# Patient Record
Sex: Female | Born: 1955
Health system: Southern US, Community
[De-identification: ages and names within clinical notes are randomized; demographics above are authoritative.]

## PROBLEM LIST (undated history)

## (undated) DIAGNOSIS — E119 Type 2 diabetes mellitus without complications: Secondary | ICD-10-CM

## (undated) DIAGNOSIS — E785 Hyperlipidemia, unspecified: Secondary | ICD-10-CM

## (undated) DIAGNOSIS — I10 Essential (primary) hypertension: Secondary | ICD-10-CM

## (undated) DIAGNOSIS — I639 Cerebral infarction, unspecified: Secondary | ICD-10-CM

## (undated) HISTORY — DX: Cerebral infarction, unspecified: I63.9

## (undated) HISTORY — DX: Type 2 diabetes mellitus without complications: E11.9

## (undated) HISTORY — DX: Essential (primary) hypertension: I10

## (undated) HISTORY — DX: Hyperlipidemia, unspecified: E78.5

---

## 1999-05-03 ENCOUNTER — Encounter: Admission: RE | Admit: 1999-05-03 | Discharge: 1999-05-03 | Payer: Self-pay | Admitting: Family Medicine

## 1999-05-03 ENCOUNTER — Encounter: Payer: Self-pay | Admitting: Family Medicine

## 2000-05-03 ENCOUNTER — Encounter: Admission: RE | Admit: 2000-05-03 | Discharge: 2000-05-03 | Payer: Self-pay | Admitting: Family Medicine

## 2000-05-03 ENCOUNTER — Encounter: Payer: Self-pay | Admitting: Family Medicine

## 2000-05-04 ENCOUNTER — Other Ambulatory Visit: Admission: RE | Admit: 2000-05-04 | Discharge: 2000-05-04 | Payer: Self-pay | Admitting: Family Medicine

## 2001-05-07 ENCOUNTER — Other Ambulatory Visit: Admission: RE | Admit: 2001-05-07 | Discharge: 2001-05-07 | Payer: Self-pay | Admitting: Family Medicine

## 2001-05-07 ENCOUNTER — Encounter: Admission: RE | Admit: 2001-05-07 | Discharge: 2001-05-07 | Payer: Self-pay | Admitting: Family Medicine

## 2001-05-07 ENCOUNTER — Encounter: Payer: Self-pay | Admitting: Family Medicine

## 2002-05-10 ENCOUNTER — Encounter: Admission: RE | Admit: 2002-05-10 | Discharge: 2002-05-10 | Payer: Self-pay | Admitting: Family Medicine

## 2002-05-10 ENCOUNTER — Encounter: Payer: Self-pay | Admitting: Family Medicine

## 2002-08-23 ENCOUNTER — Other Ambulatory Visit: Admission: RE | Admit: 2002-08-23 | Discharge: 2002-08-23 | Payer: Self-pay | Admitting: Family Medicine

## 2003-07-02 ENCOUNTER — Encounter: Admission: RE | Admit: 2003-07-02 | Discharge: 2003-07-02 | Payer: Self-pay | Admitting: Family Medicine

## 2003-08-25 ENCOUNTER — Other Ambulatory Visit: Admission: RE | Admit: 2003-08-25 | Discharge: 2003-08-25 | Payer: Self-pay | Admitting: Family Medicine

## 2003-10-17 ENCOUNTER — Ambulatory Visit (HOSPITAL_BASED_OUTPATIENT_CLINIC_OR_DEPARTMENT_OTHER): Admission: RE | Admit: 2003-10-17 | Discharge: 2003-10-17 | Payer: Self-pay | Admitting: Family Medicine

## 2004-07-21 ENCOUNTER — Encounter: Admission: RE | Admit: 2004-07-21 | Discharge: 2004-07-21 | Payer: Self-pay | Admitting: Family Medicine

## 2004-08-27 ENCOUNTER — Other Ambulatory Visit: Admission: RE | Admit: 2004-08-27 | Discharge: 2004-08-27 | Payer: Self-pay | Admitting: Family Medicine

## 2005-08-08 ENCOUNTER — Encounter: Admission: RE | Admit: 2005-08-08 | Discharge: 2005-08-08 | Payer: Self-pay | Admitting: Family Medicine

## 2006-09-05 ENCOUNTER — Encounter: Admission: RE | Admit: 2006-09-05 | Discharge: 2006-09-05 | Payer: Self-pay | Admitting: Family Medicine

## 2006-10-20 ENCOUNTER — Other Ambulatory Visit: Admission: RE | Admit: 2006-10-20 | Discharge: 2006-10-20 | Payer: Self-pay | Admitting: Family Medicine

## 2006-11-21 ENCOUNTER — Ambulatory Visit (HOSPITAL_COMMUNITY): Admission: RE | Admit: 2006-11-21 | Discharge: 2006-11-21 | Payer: Self-pay | Admitting: Gastroenterology

## 2006-11-21 ENCOUNTER — Encounter (INDEPENDENT_AMBULATORY_CARE_PROVIDER_SITE_OTHER): Payer: Self-pay | Admitting: Gastroenterology

## 2007-09-06 ENCOUNTER — Encounter: Admission: RE | Admit: 2007-09-06 | Discharge: 2007-09-06 | Payer: Self-pay | Admitting: Family Medicine

## 2007-10-26 ENCOUNTER — Other Ambulatory Visit: Admission: RE | Admit: 2007-10-26 | Discharge: 2007-10-26 | Payer: Self-pay | Admitting: Family Medicine

## 2008-09-18 ENCOUNTER — Encounter: Admission: RE | Admit: 2008-09-18 | Discharge: 2008-09-18 | Payer: Self-pay | Admitting: Family Medicine

## 2009-09-28 ENCOUNTER — Encounter: Admission: RE | Admit: 2009-09-28 | Discharge: 2009-09-28 | Payer: Self-pay | Admitting: Family Medicine

## 2010-05-25 NOTE — Op Note (Signed)
NAMEFRANCHON, Brittany [name] NO.:  [id number]   MEDICAL RECORD NO.:  [id number]          PATIENT TYPE:  AMB   LOCATION:  ENDO                         FACILITY:  [facility]   PHYSICIAN:  Graylin Shiver, M.D.   DATE OF BIRTH:  03-17-1955   DATE OF PROCEDURE:  11/21/2006  DATE OF DISCHARGE:                               OPERATIVE REPORT   PROCEDURE:  Colonoscopy with biopsy.   INDICATIONS:  Screening.  Consent was obtained after explanation of  risks of bleeding, infection and perforation.   PREMEDICATION:  Fentanyl 100 mcg IV, Versed 10 mg IV.   PROCEDURE:  With the patient in the left lateral decubitus position, a  rectal exam was performed.  No masses were felt.  The Pentax colonoscope  was inserted into the rectum and advanced around the colon to the cecum.  I had to turn the patient on her back and onto the right side to achieve  cecal intubation.  The cecum landmarks were identified.  The cecum  looked normal.  In the proximal ascending colon,  there was a 5-mm  sessile polyp biopsied off with cold forceps.  The rest of the  descending colon looked normal.  The transverse colon, descending colon,  sigmoid, and rectum all looked normal.  She tolerated the procedure well  without complications.   IMPRESSION:  A 5 mm descending colon polyp.   PLAN:  Pathology will be checked.           ______________________________  Graylin Shiver, M.D.     SFG/MEDQ  D:  11/21/2006  T:  11/22/2006  Job:  [id number]   cc:   L. Lupe Carney, M.D.  Fax: [phone]

## 2010-05-28 NOTE — Procedures (Signed)
Brittany [name], Brittany [name]                ACCOUNT NO.:  [id number]   MEDICAL RECORD NO.:  [id number]          PATIENT TYPE:  OUT   LOCATION:  SLEEP CENTER                 FACILITY:  [facility]   PHYSICIAN:  Clinton D. Maple Hudson, M.D. DATE OF BIRTH:  1955/06/06   DATE OF STUDY:  DATE OF DISCHARGE:  10/17/2003                              NOCTURNAL POLYSOMNOGRAM   REFERRING PHYSICIAN:  Dr. Benita Stabile   INDICATION FOR STUDY:  Hypersomnia with sleep apnea.  Epworth sleepiness  score 13/24.  BMI 35.  Weight 202 pounds.   SLEEP ARCHITECTURE:  Total sleep time 419 minutes with sleep efficiency 88%.  Stage I was 11%, stage II 57%, stages III and IV 11%.  REM was 21% of total  sleep time.  Sleep latency was 9 minutes.  REM latency was 212 minutes.  Awake after sleep onset 47 minutes.  Arousal index 10.  Technician does not  report any bedtime medication taken.   RESPIRATORY DATA:  Split study protocol.  RDI 9.1/hour indicating mild  obstructive sleep apnea/hypopnea syndrome before CPAP.  This included 3  obstructive apneas and 29 hypopneas.  Events were not positional.  REM RDI  was 2/hour.  CPAP was titrated to 9 CWP, RDI 0/hour using a medium  Respironics comfort gel mask.   OXYGEN DATA:  Moderate snoring with oxygen desaturation to a nadir of 87%  before CPAP.  After CPAP titration, oxygen saturation held 95-98% on room  air.   CARDIAC DATA:  Normal sinus rhythm.   MOVEMENT/PARASOMNIA:  A total of 107 limb jerks were recorded but these did  not seem to affect sleep.   IMPRESSION/RECOMMENDATION:  Mild obstructive sleep apnea/hypopnea syndrome,  RDI 9/hour.  Successful CPAP titration at 9 CWP, RDI 0/hour.  A medium  Respironics comfort gel mask was used.      CDY/MEDQ  D:  10/19/2003 12:18:14  T:  10/20/2003 12:24:13  Job:  [id number]

## 2010-06-14 ENCOUNTER — Encounter: Payer: 59 | Attending: Family Medicine

## 2010-06-22 ENCOUNTER — Ambulatory Visit: Payer: 59

## 2010-06-29 ENCOUNTER — Ambulatory Visit: Payer: 59

## 2010-07-20 ENCOUNTER — Encounter: Payer: 59 | Attending: Family Medicine

## 2010-07-21 NOTE — Progress Notes (Signed)
  Patient was seen on 07/20/2010 for the second of a series of three diabetes self-management courses at the [facility]. The following learning objectives were met by the patient during this course:   States the relationship of exercise to blood glucose  States benefits/barriers of regular and safe exercise  States three guidelines for safe and effective exercise  Describes personal diabetes medicine regimen  Describes actions of own medications  Describes causes, symptoms, and treatment of hypo/hyperglycemia  Describes sick day rules  Identifies when to test urine for ketones when appropriate  Identifies when to call healthcare provider for acute complications  States the risk for problems with foot, skin, and dental care  States preventative foot, skin, and dental care measures  States when to call healthcare provider regarding foot, skin, and dental care  Identifies methods for evaluation of diabetes control  Discusses benefits of SBGM  Identifies relationship between nutrition, exercise, medication, and glucose levels  Discusses the importance of record keeping  Follow-Up Plan: Patient will attend the final class of the ADA Diabetes Self-Care Education.  

## 2010-08-02 NOTE — Progress Notes (Signed)
  Patient was seen on 07/27/2010 for the third of a series of three diabetes self-management courses at the [facility]. The following learning objectives were met by the patient during this course:   Identifies nutrient effects on glycemia  States the general guidelines of meal planning  Relates understanding of personal meal plan  Describes situations that cause stress and discuss methods of stress management  Identifies lifestyle behaviors for change  Your patient has established the following 3 month goal for diabetes self-care:  Increase my aerobic exercise to 3 times per week.  Follow-Up Plan: Patient will attend a 3 month follow-up visit for diabetes self-management education.

## 2010-09-22 ENCOUNTER — Other Ambulatory Visit: Payer: Self-pay | Admitting: Family Medicine

## 2010-09-22 DIAGNOSIS — Z1231 Encounter for screening mammogram for malignant neoplasm of breast: Secondary | ICD-10-CM

## 2010-10-06 ENCOUNTER — Ambulatory Visit
Admission: RE | Admit: 2010-10-06 | Discharge: 2010-10-06 | Disposition: A | Discharge status: Home / Self Care | Payer: 59 | Source: Ambulatory Visit | Attending: Family Medicine | Admitting: Family Medicine

## 2010-10-06 DIAGNOSIS — Z1231 Encounter for screening mammogram for malignant neoplasm of breast: Secondary | ICD-10-CM

## 2010-11-19 ENCOUNTER — Other Ambulatory Visit: Payer: Self-pay | Admitting: Family Medicine

## 2010-11-19 ENCOUNTER — Other Ambulatory Visit (HOSPITAL_COMMUNITY)
Admission: RE | Admit: 2010-11-19 | Discharge: 2010-11-19 | Disposition: A | Discharge status: Home / Self Care | Payer: 59 | Source: Ambulatory Visit | Attending: Family Medicine | Admitting: Family Medicine

## 2010-11-19 DIAGNOSIS — Z124 Encounter for screening for malignant neoplasm of cervix: Secondary | ICD-10-CM | POA: Insufficient documentation

## 2011-10-05 ENCOUNTER — Other Ambulatory Visit: Payer: Self-pay | Admitting: Family Medicine

## 2011-10-05 DIAGNOSIS — Z1231 Encounter for screening mammogram for malignant neoplasm of breast: Secondary | ICD-10-CM

## 2011-10-18 ENCOUNTER — Ambulatory Visit
Admission: RE | Admit: 2011-10-18 | Discharge: 2011-10-18 | Disposition: A | Discharge status: Home / Self Care | Payer: 59 | Source: Ambulatory Visit | Attending: Family Medicine | Admitting: Family Medicine

## 2011-10-18 DIAGNOSIS — Z1231 Encounter for screening mammogram for malignant neoplasm of breast: Secondary | ICD-10-CM

## 2011-11-14 ENCOUNTER — Other Ambulatory Visit: Payer: Self-pay | Admitting: Gastroenterology

## 2011-11-25 ENCOUNTER — Other Ambulatory Visit (HOSPITAL_COMMUNITY)
Admission: RE | Admit: 2011-11-25 | Discharge: 2011-11-25 | Disposition: A | Discharge status: Home / Self Care | Payer: 59 | Source: Ambulatory Visit | Attending: Family Medicine | Admitting: Family Medicine

## 2011-11-25 ENCOUNTER — Other Ambulatory Visit: Payer: Self-pay | Admitting: Family Medicine

## 2011-11-25 DIAGNOSIS — Z124 Encounter for screening for malignant neoplasm of cervix: Secondary | ICD-10-CM | POA: Insufficient documentation

## 2012-05-14 ENCOUNTER — Other Ambulatory Visit: Payer: Self-pay

## 2012-05-14 DIAGNOSIS — Z1231 Encounter for screening mammogram for malignant neoplasm of breast: Secondary | ICD-10-CM

## 2012-09-12 ENCOUNTER — Ambulatory Visit (INDEPENDENT_AMBULATORY_CARE_PROVIDER_SITE_OTHER): Payer: Self-pay | Admitting: Family Medicine

## 2012-09-12 VITALS — BP 118/80 | Wt 201.0 lb

## 2012-09-12 DIAGNOSIS — E119 Type 2 diabetes mellitus without complications: Secondary | ICD-10-CM

## 2012-09-13 DIAGNOSIS — E119 Type 2 diabetes mellitus without complications: Secondary | ICD-10-CM | POA: Insufficient documentation

## 2012-09-13 NOTE — Progress Notes (Signed)
Patient presents today for yearly pharmacy consult as part of the employer-sponsored Link to Wellness program. She is currently managed by Cranford Mon, RN, CCM. Current diabetes regimen includes Metformin, Januvia, Invokana, and Bydureon. Patient also continues on daily ASA, ACEi, and statin. Most recent MD follow-up was this past July and patient will follow-up again in October. No med changes or major health changes at this time. Patient has a good understanding of medication regimen.   Diabetes Assessment: Type of Diabetes: Type 2; Sees Diabetes provider 3 times per year; MD managing Diabetes Lupe Carney; checks blood glucose 2 times a day; checks feet daily; uses glucometer True Result; takes medications as prescribed; hypoglycemia frequency 0; Year of diagnosis 2010; takes an aspirin a day; Highest CBG 425; Lowest CBG 158; A1c 9.4 (july 2014) Other Diabetes History: Current med regimen includes Metformin 1000 mg twice daily, Januvia 100 mg daily, Invokana 100 mg daily, and Bydureon 2 mg weekly. Patient is tolerating these medications well and reports good medication compliance. Patient did not bring meter today but is currently testing 2 times per day with glucose averaging 200s per patient report. Glucose monitoring occurs fasting, bedtime, and when symptomatic. Hypoglycemia frequency is rare. Patient denies signs and symptoms of neuropathy including numbness/tingling/burning and symptoms of foot infection. Patient is not due for yearly eye exam. A1c was elevated at recent MD appt to 9.4.  Lifestyle Assessment: Diet - Patient admits that her diet is less than optimal. Her worst eating habits include starchy carbs including pasta and breads, and frequent dining out. She is planning to make changes soon and would like to start with limiting breads and pasta.  Exercise - No routine exercise at this time. Patient has enjoyed walking in the past and will start walking twice weekly, starting this thursday  9/4, with a walking partner (family friend, Laqueta Due - pt refers to her as her ","daughter). They will walk for about 30 minutes to start and increase as tolerated. Patient also owns a stationary bike and eliptical machine, but needs to find motivation to start using them. I have encouraged her to start using these for 5-10 minutes in addition to walking twice weekly, in order to add variety to her weekly routine.  Assessment: Patient presents today for yearly pharmacy consult and is currenlty being managed by Marylu Lund, RN. A1c is elevated at 9.4 as of late July. Patient will follow-up with Marylu Lund in 1 month and MD in 3 months due to uncontrolled DM. She has made efforts to start an exercise routine and I am hopeful she will be motivated to continue this. She is also committed to eating fewer carbs, focusing on breads and pasta at this time.   Plan: 1) Attempt to make healthy dietary choices 2) Limit starchy carbs including breads, pasta, and sweets 3) Begin exercise regimen of walking at least twice per week 4) Consider using your stationary bike and eliiptical machine 1-2 times per week 5) Continue testing regularly 6) Follow-up with Marylu Lund for your next appt on Sept 24th @ 10:00 am

## 2012-10-18 ENCOUNTER — Ambulatory Visit
Admission: RE | Admit: 2012-10-18 | Discharge: 2012-10-18 | Disposition: A | Discharge status: Home / Self Care | Payer: 59 | Source: Ambulatory Visit

## 2012-10-18 DIAGNOSIS — Z1231 Encounter for screening mammogram for malignant neoplasm of breast: Secondary | ICD-10-CM

## 2013-05-06 NOTE — Progress Notes (Signed)
Patient ID: [name] L Pingley, female   DOB: 12-Nov-1955, 58 y.o.   MRN: [id number] ATTENDING PHYSICIAN NOTE: I have reviewed the chart and agree with the plan as detailed above. Denny LevySara Neal MD Pager [phone]

## 2013-06-27 ENCOUNTER — Telehealth: Payer: Self-pay | Admitting: Family Medicine

## 2013-07-03 NOTE — Telephone Encounter (Signed)
Left message with husband. Called Pt to schedule for BP, A1C, and LDL lab work as part of DM care. Please make appointment upon returned call.

## 2013-09-25 ENCOUNTER — Other Ambulatory Visit: Payer: Self-pay

## 2013-09-25 DIAGNOSIS — Z1231 Encounter for screening mammogram for malignant neoplasm of breast: Secondary | ICD-10-CM

## 2013-10-21 ENCOUNTER — Ambulatory Visit
Admission: RE | Admit: 2013-10-21 | Discharge: 2013-10-21 | Disposition: A | Discharge status: Home / Self Care | Payer: 59 | Source: Ambulatory Visit

## 2013-10-21 DIAGNOSIS — Z1231 Encounter for screening mammogram for malignant neoplasm of breast: Secondary | ICD-10-CM

## 2013-10-31 ENCOUNTER — Ambulatory Visit: Payer: 59 | Admitting: *Deleted

## 2013-12-03 ENCOUNTER — Ambulatory Visit: Payer: 59 | Admitting: *Deleted

## 2014-01-23 ENCOUNTER — Encounter: Payer: 59 | Attending: Family Medicine | Admitting: *Deleted

## 2014-01-23 ENCOUNTER — Encounter: Payer: Self-pay | Admitting: *Deleted

## 2014-01-23 DIAGNOSIS — Z713 Dietary counseling and surveillance: Secondary | ICD-10-CM | POA: Diagnosis not present

## 2014-01-23 DIAGNOSIS — E119 Type 2 diabetes mellitus without complications: Secondary | ICD-10-CM | POA: Insufficient documentation

## 2014-01-23 NOTE — Progress Notes (Signed)
Diabetes Self-Management Education  Visit Type:    Appt. Start Time: 0800 Appt. End Time: 0930  01/23/2014  Ms. Brittany [name], identified by name and date of birth, is a 59 y.o. female with a diagnosis of Diabetes: Type 2.  Other people present during visit:  Patient   ASSESSMENT   Initial Visit Information:  Are you currently following a meal plan?: Yes What type of meal plan do you follow?: tries to limit carbs Are you taking your medications as prescribed?: Yes Are you checking your feet?: Yes How many days per week are you checking your feet?: 7      Psychosocial:     Patient Belief/Attitude about Diabetes: Motivated to manage diabetes Self-management support: CDE visits, Diabetes magazine or newsletters Other persons present: Patient Patient Concerns: Nutrition/Meal planning, Monitoring Special Needs: None Preferred Learning Style: Visual, Hands on Learning Readiness: Ready  Complications:   Last HgB A1C per patient/outside source: 7.9 mg/dL How often do you check your blood sugar?: 1-2 times/day Fasting Blood glucose range (mg/dL): 409-811 Postprandial Blood glucose range (mg/dL): 914-782 Number of hypoglycemic episodes per month: 0 Number of hyperglycemic episodes per week: 0 Have you had a dilated eye exam in the past 12 months?: Yes Have you had a dental exam in the past 12 months?: Yes  Diet Intake:  Breakfast: coffee and whole grain toast with cheese; oatmeal and 2 slices bacon; 1/2 bagel and slice bacon Snack (morning): boiled egg Lunch: salad or 1/2 PB sandwich or might eat whole sandwich  Snack (afternoon): fruit (apple or banana) Dinner: greens, meat, cornbread; green beans and small serving of starch, if at all Snack (evening): sometimes chocolate ice icream or part of candy bar Beverage(s): coffee, water, diet soda  Exercise:  Exercise: ADL's  Individualized Plan for Diabetes Self-Management Training:   Learning Objective:  Patient will  have a greater understanding of diabetes self-management.  Patient education plan per assessed needs and concerns is to attend individual sessions  Education Topics Reviewed with Patient Today:  Definition of diabetes, type 1 and 2, and the diagnosis of diabetes, Explored patient's options for treatment of their diabetes Role of diet in the treatment of diabetes and the relationship between the three main macronutrients and blood glucose level, Food label reading, portion sizes and measuring food., Carbohydrate counting     Taught/evaluated SMBG meter., Identified appropriate SMBG and/or A1C goals., Daily foot exams, Yearly dilated eye exam, Interpreting lab values - A1C, lipid, urine microalbumina., Purpose and frequency of SMBG., Taught/discussed recording of test results and interpretation of SMBG. Taught treatment of hypoglycemia - the 15 rule. Relationship between chronic complications and blood glucose control        PATIENTS GOALS/Plan (Developed by the patient):  Nutrition: Follow meal plan discussed Monitoring : test my blood glucose as discussed   Plan:   Patient Instructions  Goals:  Follow Diabetes Meal Plan as instructed  Eat 3 meals and 2 snacks, every 3-5 hrs  Limit carbohydrate intake to 30-45 grams carbohydrate/meal  Limit carbohydrate intake to 15 grams carbohydrate/snack  Add lean protein foods to meals/snacks  Monitor glucose levels as instructed by your doctor  Aim for 30 mins of physical activity daily  Bring food record and glucose log to your next nutrition visit     Expected Outcomes:  Other (interested in learning, but was not able to demonstrate understanding. Patient has received intensive diabetes education in the past and did not understand it either)  Education material provided: Living Well  with Diabetes, Meal plan card and Snack sheet  If problems or questions, patient to contact team via:  Phone  Future DSME appointment: 6-8  weeks

## 2014-01-23 NOTE — Patient Instructions (Signed)
Goals:  Follow Diabetes Meal Plan as instructed  Eat 3 meals and 2 snacks, every 3-5 hrs  Limit carbohydrate intake to 30-45 grams carbohydrate/meal  Limit carbohydrate intake to 15 grams carbohydrate/snack  Add lean protein foods to meals/snacks  Monitor glucose levels as instructed by your doctor  Aim for 30 mins of physical activity daily  Bring food record and glucose log to your next nutrition visit 

## 2014-03-17 ENCOUNTER — Ambulatory Visit: Payer: 59 | Admitting: *Deleted

## 2014-05-05 ENCOUNTER — Encounter: Payer: 59 | Admitting: *Deleted

## 2014-05-23 ENCOUNTER — Other Ambulatory Visit: Payer: Self-pay | Admitting: *Deleted

## 2014-05-23 ENCOUNTER — Encounter: Payer: Self-pay | Admitting: *Deleted

## 2014-05-23 VITALS — BP 138/84 | Ht 63.0 in | Wt 198.8 lb

## 2014-05-23 DIAGNOSIS — E119 Type 2 diabetes mellitus without complications: Secondary | ICD-10-CM

## 2014-05-23 LAB — POCT GLYCOSYLATED HEMOGLOBIN (HGB A1C): HEMOGLOBIN A1C: 7.6

## 2014-05-23 LAB — POCT CBG (FASTING - GLUCOSE)-MANUAL ENTRY: Glucose Fasting, POC: 130 mg/dL — AB (ref 70–99)

## 2014-05-27 ENCOUNTER — Encounter: Payer: Self-pay | Admitting: *Deleted

## 2014-05-27 NOTE — Patient Outreach (Signed)
[facility]) Care Management   05/23/14  Brittany [name] 10-23-1955 [id number]  Brittany Rummageerry L Brittany [name] is an 59 y.o. female who presents for routine [facility] To Wellness follow up for self management assistance of Type II DM.  Subjective:  She has no complaints. She is discouraged that she has once again gained the weight she had previously lost. She says she will see Denny LevyLaura Reavis RD again on 5/23 for weight loss assistance.  Objective:   Review of Systems  Constitutional: Negative.     Physical Exam  Constitutional: She is oriented to person, place, and time. She appears well-developed and well-nourished.  Neurological: She is alert and oriented to person, place, and time.  Psychiatric: She has a normal mood and affect. Her behavior is normal. Judgment and thought content normal.   Filed Weights   05/23/14 1009  Weight: 198 lb 12.8 oz (90.175 kg)   Filed Vitals:   05/23/14 1009  BP: 138/84  POC post prandial CBG= 130 POC A1C= 7.6%  Current Medications:   Current Outpatient Prescriptions  Medication Sig Dispense Refill  . Canagliflozin (INVOKANA) 100 MG TABS Take 100 mg by mouth daily.    . Exenatide ER (BYDUREON) 2 MG SUSR Inject 2 mg into the skin once a week.    [name]. lisinopril-hydrochlorothiazide (PRINZIDE,ZESTORETIC) 20-12.5 MG per tablet Take 1 tablet by mouth daily.    . metFORMIN (GLUCOPHAGE) 500 MG tablet Take 1,000 mg by mouth 2 (two) times daily with a meal.    . Multiple Vitamins-Calcium (ONE-A-DAY WOMENS PO) Take 1 tablet by mouth daily.    . simvastatin (ZOCOR) 20 MG tablet Take 20 mg by mouth every evening.    . sitaGLIPtin (JANUVIA) 100 MG tablet Take 100 mg by mouth daily.    [name]. aspirin 81 MG tablet Take 81 mg by mouth daily.     No current facility-administered medications for this visit.    Functional Status:   In your present state of health, do you have any difficulty performing the following activities: 05/23/2014  Hearing? N  Vision? N   Difficulty concentrating or making decisions? N  Walking or climbing stairs? N  Dressing or bathing? N  Doing errands, shopping? N    Fall/Depression Screening:    PHQ 2/9 Scores 05/23/2014 01/23/2014  PHQ - 2 Score 0 0   [facility] CM Care Plan Problem One        Patient Outreach from 05/23/2014 in [facility]   Care Plan Problem One  Type II DM not meeting target A1C as evidenced by POC A1C= 7.6%   Care Plan for Problem One  Active   [facility] Long Term Goal (31-90 days)  Improved glycemic control as evidenced by A1C<7.5% at next check   [facility] Go[facility] Start Date  05/23/14   Interventions for Problem One Long Term Goal  reviewed the 8 core pathophysiologic deficits in Type II diabetes. discussed physiology of diabetes as a chronic progressive disease with the initial problem of insulin resistance in the muscle, liver and fat cells and then increased loss of beta cell function over time resulting in decreased insulin production, reveiwed the role of obesity, especially central obesity, on insulin resistance, reviewed nutritional counseling benefit provided by [facility] health plan and encouraged patient to keep her appointment with the dietician on 5/23 to assist with dietary management of diabetes, reviewed patient medications, discussed DM medications of Invokana, Januvia, Metformin and Bydureon ,including the mechanism of action, common side  effects, dosages and dosing schedule, reinforced importance of taking all medications as prescribed, discussed the need for the use of a combination of DM medications to correct the pathophysiologic core deficits and to  prevent or slow beta cell failure, reveiwed the reason for the use of aspirin, and blood pressure medicine in the treatment of diabetes and suggested [name] resume taking her low dose aspirin as prescribed,  provided education on the three primary macronutrients (CHO, protein, fat) and their effect on glucose levels,  reveiwed carb  counting, importance of regularly scheduled meals/snacks, and meal planning, reviewed approximate amount of CHOs to aim for at meals ( 30-45 gm ) and snacks (15 gms), discussed recommended daily amounts of fiber (30 gm)  and protein (46 for adult women) and how these two macronutrients help with satiety, used food models as teaching tool to educate patient on portion size and macronutrients,  discussed effects of physical activity on glucose levels and long-term glucose control by improving insulin sensitivity and assisting with weight management and cardiovascular health, discussed exercise opportunities offered by [facility]. encouraged patient to continue to exercise,  reviewed American Diabetic Association recommendations of 150 minutes of exercise per week including two sessions of resistance exercise weekly, reviewed upcoming appointments with RD adn primary care MD and reinforced the importance of keeping the appointments, arranged for Link To Wellness follow up in June     Assessment:   Link To Wellness member with Type II DM not meeting target A1C  Plan:  RNCM to fax today's office visit note to Dr. Clovis RileyMitchell. RNCM will meet monthly and as needed with patient per Link To Wellness program guidelines to assist with Type II DM self-management and assess patient's progress toward mutually set goals. Bary RichardJanet S. Keary Waterson RN,CCM,CDE [facility] Care Management Coordinator Link To Wellness Office Phone [phone] Office Fax [phone]

## 2014-06-02 ENCOUNTER — Encounter: Payer: 59 | Attending: Family Medicine | Admitting: *Deleted

## 2014-06-02 DIAGNOSIS — E119 Type 2 diabetes mellitus without complications: Secondary | ICD-10-CM | POA: Diagnosis not present

## 2014-06-02 DIAGNOSIS — Z713 Dietary counseling and surveillance: Secondary | ICD-10-CM | POA: Insufficient documentation

## 2014-06-02 NOTE — Progress Notes (Signed)
Appointment start time: 1530  Appointment end time: 1600  Assessment: [name] is here for follow up DMSE.  Has maintained her A1c at 7.6%.  She thought it might have increased because she has not been following her meal plan.  She has been to multiple social events where she ate more carbohydrates.  She is not exercising.  Her daily glucose checks are always elevated.  Sometimes she checks fasting, sometimes postprandially, but it's always elevated.  She is in multiple oral antihyperlycemic agents, as well as a GLP-1  24 hour recall: B: egg, piece sausage, coffee [place] Tea: raw veggies, fried chicken, greens, some pasta, fruit punch, 1/2 deviled egg, dessert: red velvet cake, lemon pie D: chicken salad, ritz crackers  Physical activity: Likes to walk, but is afraid of stray dogs being near her.  Suggested pepper spray for security.  [place]  Intervention:  Stressed need for lifestyle modification that she is capable of: she agreed to walk [place] work [place] the park near the hospital.    Follow up: prn after visit with medical provider in July.  Suspect she will need medication adjustment

## 2014-07-10 ENCOUNTER — Ambulatory Visit: Payer: Self-pay | Admitting: *Deleted

## 2014-07-17 ENCOUNTER — Encounter (HOSPITAL_COMMUNITY): Payer: Self-pay | Admitting: Emergency Medicine

## 2014-07-17 ENCOUNTER — Emergency Department (HOSPITAL_COMMUNITY)
Admission: EM | Admit: 2014-07-17 | Discharge: 2014-07-17 | Disposition: A | Discharge status: Home / Self Care | Payer: 59 | Attending: Emergency Medicine | Admitting: Emergency Medicine

## 2014-07-17 DIAGNOSIS — M25511 Pain in right shoulder: Secondary | ICD-10-CM | POA: Diagnosis not present

## 2014-07-17 DIAGNOSIS — E119 Type 2 diabetes mellitus without complications: Secondary | ICD-10-CM | POA: Diagnosis not present

## 2014-07-17 DIAGNOSIS — Z7982 Long term (current) use of aspirin: Secondary | ICD-10-CM | POA: Insufficient documentation

## 2014-07-17 DIAGNOSIS — M62838 Other muscle spasm: Secondary | ICD-10-CM | POA: Insufficient documentation

## 2014-07-17 DIAGNOSIS — I10 Essential (primary) hypertension: Secondary | ICD-10-CM | POA: Diagnosis not present

## 2014-07-17 DIAGNOSIS — Z79899 Other long term (current) drug therapy: Secondary | ICD-10-CM | POA: Diagnosis not present

## 2014-07-17 DIAGNOSIS — E785 Hyperlipidemia, unspecified: Secondary | ICD-10-CM | POA: Diagnosis not present

## 2014-07-17 DIAGNOSIS — Z87891 Personal history of nicotine dependence: Secondary | ICD-10-CM | POA: Insufficient documentation

## 2014-07-17 DIAGNOSIS — M542 Cervicalgia: Secondary | ICD-10-CM | POA: Diagnosis not present

## 2014-07-17 MED ORDER — DIAZEPAM 5 MG PO TABS
5.0000 mg | ORAL_TABLET | Freq: Once | ORAL | Status: AC
  Administered 2014-07-17: 5 mg via ORAL
  Filled 2014-07-17: qty 1

## 2014-07-17 MED ORDER — DIAZEPAM 5 MG PO TABS: 5.0000 mg | ORAL_TABLET | Freq: Three times a day (TID) | ORAL | Status: DC | PRN

## 2014-07-17 NOTE — ED Provider Notes (Signed)
CSN: [id number]     Arrival date & time 07/17/14  0002 History   First MD Initiated Contact with Patient 07/17/14 0007     Chief Complaint  Patient presents with  . Neck Pain     (Consider location/radiation/quality/duration/timing/severity/associated sxs/prior Treatment) HPI Comments: Patient presents to the emergency department with chief complaint of right-sided neck pain. She states that the muscle feels tight and is sore. She states that it has been ongoing for about a week. She noticed the symptoms after she slept funny. She states that she cannot get comfortable on her pillow now. She has tried taking Tylenol with good relief. The muscles on the side of her neck are mildly tender to palpation. She denies any other injury. Denies any other symptoms at this time.  The history is provided by the patient. No language interpreter was used.    Past Medical History  Diagnosis Date  . Diabetes mellitus without complication   . Hyperlipidemia   . Hypertension    History reviewed. No pertinent past surgical history. Family History  Problem Relation Age of Onset  . Asthma Other   . Hypertension Other   . Hyperlipidemia Other   . Diabetes Other    History  Substance Use Topics  . Smoking status: Former Games developer  . Smokeless tobacco: Never Used  . Alcohol Use: Not on file   OB History    No data available     Review of Systems  Constitutional: Negative for fever and chills.  Gastrointestinal:       No bowel incontinence  Genitourinary:       No urinary incontinence  Musculoskeletal: Positive for myalgias, back pain and arthralgias.  Neurological:       No saddle anesthesia      Allergies  Actos  Home Medications   Prior to Admission medications   Medication Sig Start Date End Date Taking? Authorizing Provider  aspirin 81 MG tablet Take 81 mg by mouth daily.    Historical Provider, MD  Canagliflozin (INVOKANA) 100 MG TABS Take 100 mg by mouth daily.    Historical  Provider, MD  Exenatide ER (BYDUREON) 2 MG SUSR Inject 2 mg into the skin once a week.    Historical Provider, MD  lisinopril-hydrochlorothiazide (PRINZIDE,ZESTORETIC) 20-12.5 MG per tablet Take 1 tablet by mouth daily.    Historical Provider, MD  metFORMIN (GLUCOPHAGE) 500 MG tablet Take 1,000 mg by mouth 2 (two) times daily with a meal.    Historical Provider, MD  Multiple Vitamins-Calcium (ONE-A-DAY WOMENS PO) Take 1 tablet by mouth daily.    Historical Provider, MD  simvastatin (ZOCOR) 20 MG tablet Take 20 mg by mouth every evening.    Historical Provider, MD  sitaGLIPtin (JANUVIA) 100 MG tablet Take 100 mg by mouth daily.    Historical Provider, MD   BP 151/84 mmHg  Pulse 87  Temp(Src) 97.4 F (36.3 C) (Oral)  Resp 16  Ht  (1.6 m)  Wt 196 lb (88.905 kg)  BMI 34.73 kg/m2  SpO2 96% Physical Exam  Constitutional: She is oriented to person, place, and time. She appears well-developed and well-nourished. No distress.  HENT:  Head: Normocephalic and atraumatic.  Eyes: Conjunctivae and EOM are normal. Right eye exhibits no discharge. Left eye exhibits no discharge. No scleral icterus.  Neck: Normal range of motion. Neck supple. No tracheal deviation present.  Cardiovascular: Normal rate, regular rhythm and normal heart sounds.  Exam reveals no gallop and no friction rub.  No murmur heard. Pulmonary/Chest: Effort normal and breath sounds normal. No respiratory distress. She has no wheezes.  Abdominal: Soft. She exhibits no distension. There is no tenderness.  Musculoskeletal: Normal range of motion.  Right-sided cervical paraspinal and right upper trapezius muscles tender to palpation, no bony tenderness, step-offs, or gross abnormality or deformity of spine, patient is able to ambulate, moves all extremities    Neurological: She is alert and oriented to person, place, and time.  Sensation and strength intact bilaterally   Skin: Skin is warm. She is not diaphoretic.   Psychiatric: She has a normal mood and affect. Her behavior is normal. Judgment and thought content normal.  Nursing note and vitals reviewed.   ED Course  Procedures (including critical care time) Labs Review Labs Reviewed - No data to display  Imaging Review No results found.   EKG Interpretation None      MDM   Final diagnoses:  Neck pain  Muscle spasm    Patient with back pain.  No neurological deficits and normal neuro exam.  Patient is ambulatory.  No loss of bowel or bladder control.  Doubt cauda equina.  Denies fever,  doubt epidural abscess or other lesion. Recommend back exercises, stretching, RICE, and will treat with a short course of valium.       Roxy Horsemanobert Georgenia Salim, PA-C 07/17/14 0028  Marisa Severinlga Otter, MD 07/17/14 [phone]

## 2014-07-17 NOTE — Discharge Instructions (Signed)

## 2014-07-17 NOTE — ED Notes (Signed)
Pt. reports right neck pain onset last week worse today , denies injury .

## 2014-07-29 ENCOUNTER — Other Ambulatory Visit: Payer: Self-pay | Admitting: *Deleted

## 2014-07-29 ENCOUNTER — Encounter: Payer: Self-pay | Admitting: *Deleted

## 2014-07-29 NOTE — Patient Outreach (Signed)
[facility] Care Management   07/29/2014  Brittany [name] 1955/11/19 [id number]  Brittany [name] is an 59 y.o. female who presents for routine quarterly [facility] follow up for self management assistance with Type II DM, HTN and hyperlipidemia.  Subjective:  Brittany [name] says her Mom was recently hospitalized and required insertion of a cardiac pacemaker. Brittany [name] says she was seen in the ED at [facility] on 07/17/14 for complaints of a stiff neck and muscle spasms. She said she was prescribed Valium and it resolved the problem. Otherwise, there has been no change in her medical history since her last office visit. Brittany [name] denies any acute problems, she continues to struggle with weight management and has seen the [facility], most recently on 06/02/14. Walking is the form of exercise she prefers but has ongoing problems with a consistent program. She states she is consistently adherent with all of her medications except she does not take the simvastatin because it causes nausea.   Objective:   Review of Systems  Constitutional: Negative.    Filed Vitals:   07/29/14 1017  BP: 112/88   Filed Weights   07/29/14 1017  Weight: 198 lb 3.2 oz (89.903 kg)    Physical Exam  Constitutional: She is oriented to person, place, and time. She appears well-developed and well-nourished.  Neurological: She is alert and oriented to person, place, and time.  Skin: Skin is warm and dry.  Psychiatric: She has a normal mood and affect. Her behavior is normal. Thought content normal.    Current Medications:   Current Outpatient Prescriptions  Medication Sig Dispense Refill  . aspirin 81 MG tablet Take 81 mg by mouth daily.    . Canagliflozin (INVOKANA) 100 MG TABS Take 100 mg by mouth daily.    . Exenatide ER (BYDUREON) 2 MG SUSR Inject 2 mg into the skin once a week.    [name]. lisinopril-hydrochlorothiazide (PRINZIDE,ZESTORETIC)  20-12.5 MG per tablet Take 1 tablet by mouth daily.    . metFORMIN (GLUCOPHAGE) 500 MG tablet Take 1,000 mg by mouth 2 (two) times daily with a meal.    . Multiple Vitamins-Calcium (ONE-A-DAY WOMENS PO) Take 1 tablet by mouth daily.    . diazepam (VALIUM) 5 MG tablet Take 1 tablet (5 mg total) by mouth every 8 (eight) hours as needed for anxiety. (Patient not taking: Reported on 07/29/2014) 5 tablet 0  . simvastatin (ZOCOR) 20 MG tablet Take 20 mg by mouth every evening.    . sitaGLIPtin (JANUVIA) 100 MG tablet Take 100 mg by mouth daily.     No current facility-administered medications for this visit.    Functional Status:   In your present state of health, do you have any difficulty performing the following activities: 07/29/2014 05/23/2014  Hearing? N N  Vision? N N  Difficulty concentrating or making decisions? N N  Walking or climbing stairs? N N  Dressing or bathing? N N  Doing errands, shopping? N N  Preparing Food and eating ? N -  Using the Toilet? N -  In the past six months, have you accidently leaked urine? N -  Do you have problems with loss of bowel control? N -  Managing your Medications? N -  Managing your Finances? N -  Housekeeping or managing your Housekeeping? N -    Fall/Depression Screening:    PHQ 2/9 Scores 05/23/2014 01/23/2014  PHQ - 2 Score 0 0  [facility] CM Care Plan Problem One        Patient Outreach from 07/29/2014 in [employer] [employer]   Care Plan Problem One  Type II DM,  not meeting target A1C 101f <7.0% as evidenced by POC A1C= 7.6% on 05/23/14   Care Plan for Problem One  Active   [facility] Long Term Goal (31-90 days)  Improved glycemic control as evidenced by A1C<7.5% at next check   [facility] Long Term Goal Start Date  05/23/14   Interventions for Problem One Long Term Goal  reviewed the 8 core pathophysiologic deficits in Type II diabetes, discussed physiology of diabetes as a chronic progressive disease, congratulated patient on keeping her appointment with  the dietician on 06/02/14 to assist with dietary management of diabetes, reviewed patient medications, discussed DM medications of Invokana, Januvia, Metformin and Bydureon ,including the mechanism of action, common side effects, dosages and dosing schedule, reinforced importance of taking all medications as prescribed, discussed the need for the use of a combination of DM medications to correct the pathophysiologic core deficits and to  prevent or slow beta cell failure, congratulated Brittany [name] on resuming her low dose aspirin as prescribed, reviewed label reading, importance of regularly scheduled meals/snacks, and meal planning,  discussed recommended daily amounts of fiber (30 gm)  and protein (46 for adult women) and how these two macronutrients help with satiety,  discussed effects of physical activity on glucose levels and long-term glucose control by improving insulin sensitivity and assisting with weight management and cardiovascular health and lipid levels, encouraged patient to resume walking, reviewed lipid profile results done 02/04/14 and discussed strategies to lower LDL and triglycerides and provided a handout with the strategies,  reviewed upcoming appointments with  primary care MD on 08/07/14 and reinforced the importance of keeping the appointments, arranged for quarterly [facility] follow up in October    Assessment:   [employer] employee and [facility] member with HTN, Type II DM and hyperlipidemia. Meeting treatment targets for HTN, but A1C, triglycerides and LDL are not meeting treatment targets.  Plan:  RNCM will fax this note to Dr. Clovis Riley. RNCM will continue to meet with Brittany Loft quarterly and prn to assist her with chronic disease management and to assess her progress toward mutually set goals.  Bary Richard RN,CCM,CDE [facility] Care Management Coordinator [facility] To [facility]ness Office Phone [phone] Office Fax [phone]

## 2014-09-22 ENCOUNTER — Other Ambulatory Visit: Payer: Self-pay

## 2014-09-22 DIAGNOSIS — Z1231 Encounter for screening mammogram for malignant neoplasm of breast: Secondary | ICD-10-CM

## 2014-10-20 ENCOUNTER — Emergency Department (HOSPITAL_COMMUNITY): Payer: 59

## 2014-10-20 ENCOUNTER — Inpatient Hospital Stay (HOSPITAL_COMMUNITY)
Admission: EM | Admit: 2014-10-20 | Discharge: 2014-10-24 | DRG: 641 | Disposition: A | Discharge status: Home / Self Care | Payer: 59 | Attending: Internal Medicine | Admitting: Internal Medicine

## 2014-10-20 ENCOUNTER — Encounter (HOSPITAL_COMMUNITY): Payer: Self-pay | Admitting: *Deleted

## 2014-10-20 DIAGNOSIS — E874 Mixed disorder of acid-base balance: Secondary | ICD-10-CM | POA: Diagnosis not present

## 2014-10-20 DIAGNOSIS — Z6834 Body mass index (BMI) 34.0-34.9, adult: Secondary | ICD-10-CM

## 2014-10-20 DIAGNOSIS — I248 Other forms of acute ischemic heart disease: Secondary | ICD-10-CM | POA: Diagnosis present

## 2014-10-20 DIAGNOSIS — I110 Hypertensive heart disease with heart failure: Secondary | ICD-10-CM | POA: Diagnosis present

## 2014-10-20 DIAGNOSIS — R079 Chest pain, unspecified: Secondary | ICD-10-CM | POA: Diagnosis not present

## 2014-10-20 DIAGNOSIS — I5030 Unspecified diastolic (congestive) heart failure: Secondary | ICD-10-CM | POA: Diagnosis present

## 2014-10-20 DIAGNOSIS — Z8249 Family history of ischemic heart disease and other diseases of the circulatory system: Secondary | ICD-10-CM | POA: Diagnosis not present

## 2014-10-20 DIAGNOSIS — E669 Obesity, unspecified: Secondary | ICD-10-CM | POA: Diagnosis present

## 2014-10-20 DIAGNOSIS — N179 Acute kidney failure, unspecified: Secondary | ICD-10-CM | POA: Diagnosis present

## 2014-10-20 DIAGNOSIS — Z833 Family history of diabetes mellitus: Secondary | ICD-10-CM

## 2014-10-20 DIAGNOSIS — E873 Alkalosis: Secondary | ICD-10-CM | POA: Diagnosis not present

## 2014-10-20 DIAGNOSIS — E872 Acidosis, unspecified: Secondary | ICD-10-CM | POA: Diagnosis present

## 2014-10-20 DIAGNOSIS — I252 Old myocardial infarction: Secondary | ICD-10-CM

## 2014-10-20 DIAGNOSIS — I5032 Chronic diastolic (congestive) heart failure: Secondary | ICD-10-CM | POA: Diagnosis not present

## 2014-10-20 DIAGNOSIS — E118 Type 2 diabetes mellitus with unspecified complications: Secondary | ICD-10-CM | POA: Diagnosis not present

## 2014-10-20 DIAGNOSIS — D72829 Elevated white blood cell count, unspecified: Secondary | ICD-10-CM | POA: Diagnosis present

## 2014-10-20 DIAGNOSIS — R0682 Tachypnea, not elsewhere classified: Secondary | ICD-10-CM

## 2014-10-20 DIAGNOSIS — E860 Dehydration: Secondary | ICD-10-CM | POA: Diagnosis present

## 2014-10-20 DIAGNOSIS — IMO0002 Reserved for concepts with insufficient information to code with codable children: Secondary | ICD-10-CM | POA: Diagnosis present

## 2014-10-20 DIAGNOSIS — Z888 Allergy status to other drugs, medicaments and biological substances status: Secondary | ICD-10-CM

## 2014-10-20 DIAGNOSIS — E876 Hypokalemia: Secondary | ICD-10-CM | POA: Diagnosis not present

## 2014-10-20 DIAGNOSIS — R0609 Other forms of dyspnea: Secondary | ICD-10-CM | POA: Diagnosis present

## 2014-10-20 DIAGNOSIS — R7989 Other specified abnormal findings of blood chemistry: Secondary | ICD-10-CM

## 2014-10-20 DIAGNOSIS — R0602 Shortness of breath: Secondary | ICD-10-CM | POA: Diagnosis not present

## 2014-10-20 DIAGNOSIS — D509 Iron deficiency anemia, unspecified: Secondary | ICD-10-CM | POA: Diagnosis present

## 2014-10-20 DIAGNOSIS — R000 Tachycardia, unspecified: Secondary | ICD-10-CM

## 2014-10-20 DIAGNOSIS — J811 Chronic pulmonary edema: Secondary | ICD-10-CM | POA: Diagnosis present

## 2014-10-20 DIAGNOSIS — Z87891 Personal history of nicotine dependence: Secondary | ICD-10-CM | POA: Diagnosis not present

## 2014-10-20 DIAGNOSIS — R739 Hyperglycemia, unspecified: Secondary | ICD-10-CM | POA: Diagnosis not present

## 2014-10-20 DIAGNOSIS — Z7984 Long term (current) use of oral hypoglycemic drugs: Secondary | ICD-10-CM

## 2014-10-20 DIAGNOSIS — R748 Abnormal levels of other serum enzymes: Secondary | ICD-10-CM | POA: Diagnosis present

## 2014-10-20 DIAGNOSIS — K760 Fatty (change of) liver, not elsewhere classified: Secondary | ICD-10-CM | POA: Diagnosis present

## 2014-10-20 DIAGNOSIS — E785 Hyperlipidemia, unspecified: Secondary | ICD-10-CM | POA: Diagnosis present

## 2014-10-20 DIAGNOSIS — R778 Other specified abnormalities of plasma proteins: Secondary | ICD-10-CM | POA: Insufficient documentation

## 2014-10-20 DIAGNOSIS — Z7982 Long term (current) use of aspirin: Secondary | ICD-10-CM

## 2014-10-20 DIAGNOSIS — E1165 Type 2 diabetes mellitus with hyperglycemia: Secondary | ICD-10-CM | POA: Diagnosis present

## 2014-10-20 LAB — I-STAT VENOUS BLOOD GAS, ED
Acid-base deficit: 8 mmol/L — ABNORMAL HIGH (ref 0.0–2.0)
BICARBONATE: 16.1 meq/L — AB (ref 20.0–24.0)
O2 SAT: 97 %
PCO2 VEN: 30.4 mmHg — AB (ref 45.0–50.0)
PO2 VEN: 99 mmHg — AB (ref 30.0–45.0)
TCO2: 17 mmol/L (ref 0–100)
pH, Ven: 7.333 — ABNORMAL HIGH (ref 7.250–7.300)

## 2014-10-20 LAB — COMPREHENSIVE METABOLIC PANEL
ALBUMIN: 2.9 g/dL — AB (ref 3.5–5.0)
ALT: 217 U/L — AB (ref 14–54)
AST: 135 U/L — AB (ref 15–41)
Alkaline Phosphatase: 462 U/L — ABNORMAL HIGH (ref 38–126)
Anion gap: 29 — ABNORMAL HIGH (ref 5–15)
BUN: 25 mg/dL — AB (ref 6–20)
CHLORIDE: 94 mmol/L — AB (ref 101–111)
CO2: 17 mmol/L — AB (ref 22–32)
CREATININE: 1.87 mg/dL — AB (ref 0.44–1.00)
Calcium: 9.1 mg/dL (ref 8.9–10.3)
GFR calc Af Amer: 33 mL/min — ABNORMAL LOW (ref >60)
GFR calc non Af Amer: 28 mL/min — ABNORMAL LOW (ref >60)
GLUCOSE: 201 mg/dL — AB (ref 65–99)
POTASSIUM: 3.1 mmol/L — AB (ref 3.5–5.1)
SODIUM: 140 mmol/L (ref 135–145)
Total Bilirubin: 3.9 mg/dL — ABNORMAL HIGH (ref 0.3–1.2)
Total Protein: 7.2 g/dL (ref 6.5–8.1)

## 2014-10-20 LAB — CBC WITH DIFFERENTIAL/PLATELET
BASOS ABS: 0 10*3/uL (ref 0.0–0.1)
BASOS PCT: 0 %
EOS ABS: 1.3 10*3/uL — AB (ref 0.0–0.7)
EOS PCT: 7 %
HCT: 42.1 % (ref 36.0–46.0)
Hemoglobin: 13 g/dL (ref 12.0–15.0)
LYMPHS PCT: 6 %
Lymphs Abs: 1.1 10*3/uL (ref 0.7–4.0)
MCH: 23.7 pg — ABNORMAL LOW (ref 26.0–34.0)
MCHC: 30.9 g/dL (ref 30.0–36.0)
MCV: 76.7 fL — AB (ref 78.0–100.0)
MONO ABS: 1.9 10*3/uL — AB (ref 0.1–1.0)
Monocytes Relative: 10 %
Neutro Abs: 14.4 10*3/uL — ABNORMAL HIGH (ref 1.7–7.7)
Neutrophils Relative %: 77 %
PLATELETS: 214 10*3/uL (ref 150–400)
RBC: 5.49 MIL/uL — AB (ref 3.87–5.11)
RDW: 15.1 % (ref 11.5–15.5)
WBC: 18.7 10*3/uL — AB (ref 4.0–10.5)

## 2014-10-20 LAB — URINALYSIS, ROUTINE W REFLEX MICROSCOPIC
Ketones, ur: >80 mg/dL — AB
Nitrite: NEGATIVE
PROTEIN: 30 mg/dL — AB
SPECIFIC GRAVITY, URINE: 1.025 (ref 1.005–1.030)
Urobilinogen, UA: 1 mg/dL (ref 0.0–1.0)
pH: 5 (ref 5.0–8.0)

## 2014-10-20 LAB — BASIC METABOLIC PANEL
ANION GAP: 21 — AB (ref 5–15)
ANION GAP: 18 — AB (ref 5–15)
BUN: 22 mg/dL — ABNORMAL HIGH (ref 6–20)
BUN: 20 mg/dL (ref 6–20)
CALCIUM: 7.7 mg/dL — AB (ref 8.9–10.3)
CALCIUM: 7.5 mg/dL — AB (ref 8.9–10.3)
CO2: 19 mmol/L — AB (ref 22–32)
CO2: 17 mmol/L — AB (ref 22–32)
CREATININE: 1.47 mg/dL — AB (ref 0.44–1.00)
CREATININE: 1.28 mg/dL — AB (ref 0.44–1.00)
Chloride: 97 mmol/L — ABNORMAL LOW (ref 101–111)
Chloride: 101 mmol/L (ref 101–111)
GFR, EST AFRICAN AMERICAN: 44 mL/min — AB (ref >60)
GFR, EST AFRICAN AMERICAN: 52 mL/min — AB (ref >60)
GFR, EST NON AFRICAN AMERICAN: 38 mL/min — AB (ref >60)
GFR, EST NON AFRICAN AMERICAN: 45 mL/min — AB (ref >60)
Glucose, Bld: 132 mg/dL — ABNORMAL HIGH (ref 65–99)
Glucose, Bld: 154 mg/dL — ABNORMAL HIGH (ref 65–99)
Potassium: 3.2 mmol/L — ABNORMAL LOW (ref 3.5–5.1)
Potassium: 3.4 mmol/L — ABNORMAL LOW (ref 3.5–5.1)
SODIUM: 137 mmol/L (ref 135–145)
SODIUM: 136 mmol/L (ref 135–145)

## 2014-10-20 LAB — I-STAT CG4 LACTIC ACID, ED
LACTIC ACID, VENOUS: 1.39 mmol/L (ref 0.5–2.0)
LACTIC ACID, VENOUS: 1.53 mmol/L (ref 0.5–2.0)

## 2014-10-20 LAB — I-STAT TROPONIN, ED
TROPONIN I, POC: 0.6 ng/mL — AB (ref 0.00–0.08)
Troponin i, poc: 0.63 ng/mL (ref 0.00–0.08)

## 2014-10-20 LAB — LIPASE, BLOOD: Lipase: 118 U/L — ABNORMAL HIGH (ref 22–51)

## 2014-10-20 LAB — GLUCOSE, CAPILLARY: GLUCOSE-CAPILLARY: 138 mg/dL — AB (ref 65–99)

## 2014-10-20 LAB — D-DIMER, QUANTITATIVE (NOT AT ARMC): D DIMER QUANT: 5.43 ug{FEU}/mL — AB (ref 0.00–0.48)

## 2014-10-20 LAB — URINE MICROSCOPIC-ADD ON

## 2014-10-20 LAB — CK: Total CK: 342 U/L — ABNORMAL HIGH (ref 38–234)

## 2014-10-20 LAB — MRSA PCR SCREENING: MRSA by PCR: NEGATIVE

## 2014-10-20 LAB — TROPONIN I
Troponin I: 0.41 ng/mL — ABNORMAL HIGH (ref <0.031)
Troponin I: 0.31 ng/mL — ABNORMAL HIGH (ref <0.031)

## 2014-10-20 LAB — HEPARIN LEVEL (UNFRACTIONATED): Heparin Unfractionated: 0.31 IU/mL (ref 0.30–0.70)

## 2014-10-20 LAB — MAGNESIUM: MAGNESIUM: 2.7 mg/dL — AB (ref 1.7–2.4)

## 2014-10-20 LAB — PHOSPHORUS: PHOSPHORUS: 3.8 mg/dL (ref 2.5–4.6)

## 2014-10-20 MED ORDER — ACETONE (URINE) TEST VI STRP: 1.0000 | ORAL_STRIP | Status: DC | PRN

## 2014-10-20 MED ORDER — ACETAMINOPHEN 325 MG PO TABS: 650.0000 mg | ORAL_TABLET | Freq: Four times a day (QID) | ORAL | Status: DC | PRN

## 2014-10-20 MED ORDER — HEPARIN (PORCINE) IN NACL 100-0.45 UNIT/ML-% IJ SOLN
1100.0000 [IU]/h | INTRAMUSCULAR | Status: DC
  Administered 2014-10-20: 850 [IU]/h via INTRAVENOUS
  Filled 2014-10-20 (×3): qty 250

## 2014-10-20 MED ORDER — ONDANSETRON HCL 4 MG PO TABS: 4.0000 mg | ORAL_TABLET | Freq: Four times a day (QID) | ORAL | Status: DC | PRN

## 2014-10-20 MED ORDER — ONDANSETRON HCL 4 MG/2ML IJ SOLN: 4.0000 mg | Freq: Four times a day (QID) | INTRAMUSCULAR | Status: DC | PRN

## 2014-10-20 MED ORDER — ACETAMINOPHEN 650 MG RE SUPP: 650.0000 mg | Freq: Four times a day (QID) | RECTAL | Status: DC | PRN

## 2014-10-20 MED ORDER — PIPERACILLIN-TAZOBACTAM 3.375 G IVPB 30 MIN
3.3750 g | Freq: Once | INTRAVENOUS | Status: AC
  Administered 2014-10-20: 3.375 g via INTRAVENOUS
  Filled 2014-10-20: qty 50

## 2014-10-20 MED ORDER — SENNOSIDES-DOCUSATE SODIUM 8.6-50 MG PO TABS
1.0000 | ORAL_TABLET | Freq: Every evening | ORAL | Status: DC | PRN
  Filled 2014-10-20: qty 1

## 2014-10-20 MED ORDER — DOCUSATE SODIUM 100 MG PO CAPS
100.0000 mg | ORAL_CAPSULE | Freq: Two times a day (BID) | ORAL | Status: DC
  Administered 2014-10-21 – 2014-10-24 (×6): 100 mg via ORAL
  Filled 2014-10-20 (×7): qty 1

## 2014-10-20 MED ORDER — DEXTROSE-NACL 5-0.45 % IV SOLN
INTRAVENOUS | Status: DC
  Administered 2014-10-20: 19:00:00 via INTRAVENOUS

## 2014-10-20 MED ORDER — ASPIRIN 81 MG PO CHEW
81.0000 mg | CHEWABLE_TABLET | Freq: Every day | ORAL | Status: DC
  Administered 2014-10-21 – 2014-10-24 (×4): 81 mg via ORAL
  Filled 2014-10-20 (×4): qty 1

## 2014-10-20 MED ORDER — VANCOMYCIN HCL IN DEXTROSE 1-5 GM/200ML-% IV SOLN: 1000.0000 mg | Freq: Once | INTRAVENOUS | Status: DC

## 2014-10-20 MED ORDER — SODIUM CHLORIDE 0.9 % IV SOLN
INTRAVENOUS | Status: DC
  Administered 2014-10-20: 0.8 [IU]/h via INTRAVENOUS
  Filled 2014-10-20: qty 2.5

## 2014-10-20 MED ORDER — POTASSIUM CHLORIDE 10 MEQ/100ML IV SOLN
10.0000 meq | INTRAVENOUS | Status: AC
  Administered 2014-10-20 (×3): 10 meq via INTRAVENOUS
  Filled 2014-10-20 (×3): qty 100

## 2014-10-20 MED ORDER — VANCOMYCIN HCL 10 G IV SOLR: 1250.0000 mg | INTRAVENOUS | Status: DC

## 2014-10-20 MED ORDER — PIPERACILLIN-TAZOBACTAM 3.375 G IVPB
3.3750 g | Freq: Three times a day (TID) | INTRAVENOUS | Status: DC
Start: 2014-10-20 — End: 2014-10-20

## 2014-10-20 MED ORDER — ASPIRIN 81 MG PO CHEW
324.0000 mg | CHEWABLE_TABLET | Freq: Once | ORAL | Status: AC
  Administered 2014-10-20: 324 mg via ORAL
  Filled 2014-10-20: qty 4

## 2014-10-20 MED ORDER — SODIUM CHLORIDE 0.9 % IV BOLUS (SEPSIS)
1000.0000 mL | Freq: Once | INTRAVENOUS | Status: AC
  Administered 2014-10-20: 1000 mL via INTRAVENOUS

## 2014-10-20 MED ORDER — DIAZEPAM 5 MG PO TABS: 5.0000 mg | ORAL_TABLET | Freq: Three times a day (TID) | ORAL | Status: DC | PRN

## 2014-10-20 MED ORDER — SODIUM CHLORIDE 0.9 % IV SOLN
Freq: Once | INTRAVENOUS | Status: AC
  Administered 2014-10-20: 11:00:00 via INTRAVENOUS

## 2014-10-20 MED ORDER — DEXTROSE 5 % IV SOLN
1.0000 g | Freq: Once | INTRAVENOUS | Status: AC
  Administered 2014-10-20: 1 g via INTRAVENOUS
  Filled 2014-10-20: qty 10

## 2014-10-20 MED ORDER — HEPARIN BOLUS VIA INFUSION
4000.0000 [IU] | Freq: Once | INTRAVENOUS | Status: AC
  Administered 2014-10-20: 4000 [IU] via INTRAVENOUS
  Filled 2014-10-20: qty 4000

## 2014-10-20 MED ORDER — VANCOMYCIN HCL 10 G IV SOLR
1750.0000 mg | Freq: Once | INTRAVENOUS | Status: DC
  Administered 2014-10-20: 1750 mg via INTRAVENOUS
  Filled 2014-10-20: qty 1750

## 2014-10-20 MED ORDER — SODIUM CHLORIDE 0.9 % IV SOLN: INTRAVENOUS | Status: DC

## 2014-10-20 MED ORDER — SODIUM CHLORIDE 0.9 % IV SOLN
INTRAVENOUS | Status: AC
  Administered 2014-10-20: 16:00:00 via INTRAVENOUS

## 2014-10-20 NOTE — ED Notes (Signed)
Attempted report 

## 2014-10-20 NOTE — ED Provider Notes (Signed)
CSN: [id number]     Arrival date & time 10/20/14  4098 History   First MD Initiated Contact with Patient 10/20/14 [phone]     Chief Complaint  Patient presents with  . Urinary Tract Infection  . Shortness of Breath     (Consider location/radiation/quality/duration/timing/severity/associated sxs/prior Treatment) HPI Brittany [name] is a 59 y.o. female history of diabetes, hypertension, presents to emergency department complaining of urinary symptoms of shortness of breath. Patient states that she was diagnosed with UTI 3 days ago, started on Cipro. States shortly after starting on Cipro she develop shortness of breath that she believed this was due to the antibiotics so she stopped it 2 days ago. She states she continues to have urinary symptoms. She denies any abdominal pain or back pain. Main complaint however is generalized weakness and shortness of breath. She denies any worsening symptoms on exertion. She denies any cough or congestion. No fever. Denies any nausea or vomiting. No diarrhea. No other new medications.  Past Medical History  Diagnosis Date  . Diabetes mellitus without complication (HCC)   . Hyperlipidemia   . Hypertension    History reviewed. No pertinent past surgical history. Family History  Problem Relation Age of Onset  . Asthma Other   . Hypertension Other   . Hyperlipidemia Other   . Diabetes Other   . Heart block Mother    Social History  Substance Use Topics  . Smoking status: Former Games developer  . Smokeless tobacco: Never Used  . Alcohol Use: None   OB History    No data available     Review of Systems  Constitutional: Positive for fatigue. Negative for fever and chills.  Respiratory: Positive for shortness of breath. Negative for cough and chest tightness.   Cardiovascular: Negative for chest pain, palpitations and leg swelling.  Gastrointestinal: Negative for nausea, vomiting, abdominal pain and diarrhea.  Genitourinary: Positive for dysuria. Negative  for flank pain, vaginal bleeding, vaginal discharge, vaginal pain and pelvic pain.  Musculoskeletal: Negative for myalgias, arthralgias, neck pain and neck stiffness.  Skin: Negative for rash.  Neurological: Positive for weakness. Negative for dizziness and headaches.  All other systems reviewed and are negative.     Allergies  Actos and Statins  Home Medications   Prior to Admission medications   Medication Sig Start Date End Date Taking? Authorizing Provider  aspirin 81 MG tablet Take 81 mg by mouth daily.    Historical Provider, MD  Canagliflozin (INVOKANA) 100 MG TABS Take 100 mg by mouth daily.    Historical Provider, MD  diazepam (VALIUM) 5 MG tablet Take 1 tablet (5 mg total) by mouth every 8 (eight) hours as needed for anxiety. Patient not taking: Reported on 07/29/2014 07/17/14   Roxy Horseman, PA-C  Exenatide ER (BYDUREON) 2 MG SUSR Inject 2 mg into the skin once a week.    Historical Provider, MD  lisinopril-hydrochlorothiazide (PRINZIDE,ZESTORETIC) 20-12.5 MG per tablet Take 1 tablet by mouth daily.    Historical Provider, MD  metFORMIN (GLUCOPHAGE) 500 MG tablet Take 1,000 mg by mouth 2 (two) times daily with a meal.    Historical Provider, MD  Multiple Vitamins-Calcium (ONE-A-DAY WOMENS PO) Take 1 tablet by mouth daily.    Historical Provider, MD  simvastatin (ZOCOR) 20 MG tablet Take 20 mg by mouth every evening.    Historical Provider, MD  sitaGLIPtin (JANUVIA) 100 MG tablet Take 100 mg by mouth daily.    Historical Provider, MD   BP 108/73 mmHg  Pulse  111  Temp(Src) 97.7 F (36.5 C) (Oral)  Resp 26  SpO2 97% Physical Exam  Constitutional: She is oriented to person, place, and time. She appears well-developed and well-nourished.  Ill appearing  HENT:  Head: Normocephalic.  Right Ear: External ear normal.  Left Ear: External ear normal.  Nose: Nose normal.  Mouth/Throat: Oropharynx is clear and moist.  Eyes: Conjunctivae are normal. Pupils are equal, round, and  reactive to light.  Neck: Normal range of motion. Neck supple.  Cardiovascular: Normal rate, regular rhythm and normal heart sounds.   Pulmonary/Chest: Effort normal. She has no wheezes. She has no rales.  tachypnec  Abdominal: Soft. Bowel sounds are normal. She exhibits no distension. There is no tenderness. There is no rebound.  No cva tenderness  Musculoskeletal: She exhibits no edema.  Neurological: She is alert and oriented to person, place, and time. No cranial nerve deficit. Coordination normal.  Skin: Skin is warm and dry.  Psychiatric: She has a normal mood and affect. Her behavior is normal.  Nursing note and vitals reviewed.   ED Course  Procedures (including critical care time) Labs Review Labs Reviewed  CBC WITH DIFFERENTIAL/PLATELET - Abnormal; Notable for the following:    WBC 18.7 (*)    RBC 5.49 (*)    MCV 76.7 (*)    MCH 23.7 (*)    Neutro Abs 14.4 (*)    Monocytes Absolute 1.9 (*)    Eosinophils Absolute 1.3 (*)    All other components within normal limits  COMPREHENSIVE METABOLIC PANEL - Abnormal; Notable for the following:    Potassium 3.1 (*)    Chloride 94 (*)    CO2 17 (*)    Glucose, Bld 201 (*)    BUN 25 (*)    Creatinine, Ser 1.87 (*)    Albumin 2.9 (*)    AST 135 (*)    ALT 217 (*)    Alkaline Phosphatase 462 (*)    Total Bilirubin 3.9 (*)    GFR calc non Af Amer 28 (*)    GFR calc Af Amer 33 (*)    Anion gap 29 (*)    All other components within normal limits  URINALYSIS, ROUTINE W REFLEX MICROSCOPIC (NOT AT [facility]) - Abnormal; Notable for the following:    APPearance CLOUDY (*)    Glucose, UA >1000 (*)    Hgb urine dipstick TRACE (*)    Bilirubin Urine MODERATE (*)    Ketones, ur >80 (*)    Protein, ur 30 (*)    Leukocytes, UA TRACE (*)    All other components within normal limits  D-DIMER, QUANTITATIVE (NOT AT [facility]) - Abnormal; Notable for the following:    D-Dimer, Quant 5.43 (*)    All other components within normal limits  URINE  MICROSCOPIC-ADD ON - Abnormal; Notable for the following:    Squamous Epithelial / LPF FEW (*)    Bacteria, UA FEW (*)    Casts GRANULAR CAST (*)    All other components within normal limits  I-STAT TROPOININ, ED - Abnormal; Notable for the following:    Troponin i, poc 0.63 (*)    All other components within normal limits  I-STAT VENOUS BLOOD GAS, ED - Abnormal; Notable for the following:    pH, Ven 7.333 (*)    pCO2, Ven 30.4 (*)    pO2, Ven 99.0 (*)    Bicarbonate 16.1 (*)    Acid-base deficit 8.0 (*)    All other components within normal  limits  I-STAT TROPOININ, ED - Abnormal; Notable for the following:    Troponin i, poc 0.60 (*)    All other components within normal limits  CULTURE, BLOOD (ROUTINE X 2)  CULTURE, BLOOD (ROUTINE X 2)  URINE CULTURE  HEPARIN LEVEL (UNFRACTIONATED)  LIPASE, BLOOD  BASIC METABOLIC PANEL  BASIC METABOLIC PANEL  BASIC METABOLIC PANEL  BASIC METABOLIC PANEL  CK  I-STAT CG4 LACTIC ACID, ED  I-STAT CG4 LACTIC ACID, ED    Imaging Review No results found. I have personally reviewed and evaluated these images and lab results as part of my medical decision-making.   EKG Interpretation   Date/Time:  Monday October 20 2014 07:49:45 EDT Ventricular Rate:  115 PR Interval:  120 QRS Duration: 86 QT Interval:  336 QTC Calculation: 464 R Axis:   72 Text Interpretation:  Sinus tachycardia Anterior infarct , age  undetermined Abnormal ECG No old tracing to compare Confirmed by GOLDSTON   MD, SCOTT ([id number]) on 10/20/2014 7:59:28 AM      MDM   Final diagnoses:  Shortness of breath  Tachypnea  Tachycardia  Hyperglycemia    patient emergency department with shortness of breath, recent diagnosis of UTI, took one day of Cipro. Patient appears to be tachypnea, otherwise she has no complaints. Vital signs show tachycardia with heart rate of 114, tachypnea, normal blood pressure, normal temperature. Will get labs including troponin, d-dimer, chest  x-ray, urinalysis.   11:05 AM Patient's labs showed anion gap of 29, elevated white blood cell count of 18. CO2 of 17. Blood sugar 21. Creatinine 1.7. Question metabolic process which may in turn be causing her tachypnea. Patient continues to receive IV fluids. Her vital signs are unchanged. She is afebrile. She continues to deny any abdominal pain despite elevated LFTs, bilirubin 4. Also concerning for possible sepsis, I added more fluids and broad-spectrum IV antibiotics. Blood cultures were drawn earlier. Also unable to get CT image of the chest because of elevated creatinine, will get VQ scan. Will get ultrasound abdomen 2 rule out cholecystitis or cholangitis.   2:32 PM VQ scan is negative. Ultrasound pending, off note I ordered it at 11:30. I discussed patient with tried hospitals, they will admit. This time differential still includes sepsis, cholelithiasis versus cholecystitis, urosepsis.  Filed Vitals:   10/20/14 1225 10/20/14 1434 10/20/14 1445 10/20/14 1500  BP: 113/84 119/70 104/70 102/71  Pulse: 118 110 112 114  Temp:      TempSrc:      Resp: 33  20 29  Height:      Weight:      SpO2: 96% 98% 98% 98%     Brittany Crumble, PA-C 10/20/14 1655  Pricilla Loveless, MD 10/21/14 [phone]

## 2014-10-20 NOTE — Progress Notes (Addendum)
ANTIBIOTIC CONSULT NOTE - INITIAL  Pharmacy Consult for Zosyn and Vancomycin Indication: rule out sepsis  Patient Measurements: Height:  (160 cm) Weight: 193 lb (87.544 kg) IBW/kg (Calculated) : 52.4   Vital Signs: Temp: 97.7 F (36.5 C) (10/10 0801) Temp Source: Oral (10/10 0801) BP: 113/84 mmHg (10/10 1225) Pulse Rate: 118 (10/10 1225)  Labs:  Recent Labs  10/20/14 0853  WBC 18.7*  HGB 13.0  PLT 214  CREATININE 1.87*  Estimated CrCl: 30 - 40 ml/min  Microbiology: 10/10 blood: 10/10 urine:   ABX received: Ceftriaxone: 10/10 x 1 dose Zosyn 10/10 >> Vancomycin 10/10 >>  Assessment: 52 yoM presented with CP, SOB, concern for UTI/sepsis in setting of AKI and potential DKA/HHS with anion gap around 30 on admit  Goal of Therapy:  Vancomycin trough level 15-20 mcg/ml  Eradication of infection  Plan:  -Vancomcycin 1750 mg x 1 and then 1250 mg q24H based on current renal fxn - Zosyn 3.375 gm over 30 min then q8H extended infusion - Monitor renal fxn and adjust abx as needed - Following daily   Pollyann Samples, PharmD, BCPS 10/20/2014, 2:00 PM Pager: [phone]

## 2014-10-20 NOTE — ED Notes (Signed)
Admitting MD at bedside.

## 2014-10-20 NOTE — Progress Notes (Signed)
ANTICOAGULATION CONSULT NOTE  Pharmacy Consult for Heparin Indication: chest pain/ACS  Allergies  Allergen Reactions  . Actos [Pioglitazone] Other (See Comments)    Headache and myalgia  . Statins Nausea Only    Patient Measurements: Height:  (160 cm) Weight: 193 lb (87.544 kg) IBW/kg (Calculated) : 52.4 Heparin Dosing Weight: 72.1  Vital Signs: Temp: 98.5 F (36.9 C) (10/10 1646) Temp Source: Oral (10/10 1646) BP: 102/56 mmHg (10/10 1630) Pulse Rate: 111 (10/10 1630)  Labs:  Recent Labs  10/20/14 0853 10/20/14 1716  HGB 13.0  --   HCT 42.1  --   PLT 214  --   HEPARINUNFRC  --  0.31  CREATININE 1.87*  --     Estimated Creatinine Clearance: 33.5 mL/min (by C-G formula based on Cr of 1.87).   Medical History: Past Medical History  Diagnosis Date  . Diabetes mellitus without complication (HCC)   . Hyperlipidemia   . Hypertension     Assessment: 59yoF admitted with chest pain. Pharmacy consulted to dose heparin for ACS. No anti-coag PTA. CBC stable. No report of bleeding  Initial HL is therapeutic at 0.31 on heparin 850 units/hr. No issues with infusion or bleeding noted. Trop 0.63>>0.6.  Goal of Therapy:  Heparin level 0.3-0.7 units/ml Monitor platelets by anticoagulation protocol: Yes   Plan:  Continue heparin 850 units/hr Daily HL/CBC Monitor s/sxs of bleeding  Arlean Hopping. Newman Pies, PharmD Clinical Pharmacist Pager [phone]  10/20/2014, 5:34 PM

## 2014-10-20 NOTE — ED Notes (Signed)
Pt taken to nuclear med.

## 2014-10-20 NOTE — Progress Notes (Signed)
ANTICOAGULATION CONSULT NOTE - Initial Consult  Pharmacy Consult for Heparin Indication: chest pain/ACS  Allergies  Allergen Reactions  . Actos [Pioglitazone] Other (See Comments)    Headache and myalgia  . Statins Nausea Only    Patient Measurements: Height:  (160 cm) Weight: 193 lb (87.544 kg) IBW/kg (Calculated) : 52.4 Heparin Dosing Weight: 72.1  Vital Signs: Temp: 97.7 F (36.5 C) (10/10 0801) Temp Source: Oral (10/10 0801) BP: 108/73 mmHg (10/10 0815) Pulse Rate: 111 (10/10 0815)  Labs:  Recent Labs  10/20/14 0853  HGB 13.0  HCT 42.1  PLT 214  CREATININE 1.87*    Estimated Creatinine Clearance: 33.5 mL/min (by C-G formula based on Cr of 1.87).   Medical History: Past Medical History  Diagnosis Date  . Diabetes mellitus without complication (HCC)   . Hyperlipidemia   . Hypertension     Assessment: 59yoF admitted with chest pain. Pharmacy consulted to dose heparin for ACS. No anti-coag PTA. CBC stable. No report of bleeding  Goal of Therapy:  Heparin level 0.3-0.7 units/ml Monitor platelets by anticoagulation protocol: Yes   Plan:  -Heparin 4000 units bolus - start drip at 850 units/hr - 6 hr HL -daily HL and CBC - Monitor sxs of bleeding  Pollyann Samples, PharmD, BCPS 10/20/2014, 10:32 AM Pager: [phone]

## 2014-10-20 NOTE — ED Notes (Signed)
Pt reports having recent UTI and started taking Cipro on Friday. Pt reports SOB and chest pain starting after that. Pt denies chest pain at this time.

## 2014-10-20 NOTE — H&P (Signed)
[facility] History and Physical  Brittany [name] AVW:[id number] DOB: January 28, 1955 DOA: 10/20/2014  Referring physician: Dr. Patria Mane PCP: Brittany Carney, MD   Chief Complaint: SOB and chest pain  HPI: Brittany [name] is a 59 y.o. female with a history of diabetes and hypertension in the ED with shortness of breath . Patient saw PCP Friday for change in urine color, she apparently had a low-grade temp at the time. She was started on Cipro for presumed UTI . After a dose of Cipro on Friday patient developed shortness of breath and left-sided chest discomfort. She stopped Cipro after 3rd dose on Saturday because of persistent SOB and chest discomfort. After discontinuation of the anti-biotic her chest discomfort subsided but the shortness of breath persisted.  No cough , she has had chills and low-grade temp since Friday . No other complaints. In the emergency department patient has multiple lab abnormalities.   ED course:   Patient tachycardic with heart rate of 117, respirations rapid in the thirties. Afebrile. Oxygen saturation 95% on room air. ABGs - pH 7.3, PCO2 low at 30.4, PO2 99, bicarbonate 16. Troponin elevated at 0.63, EKG reveals sinus tachycardia at 150 bpm, anterior infarct, age undetermined. Hemoglobin normal at13, MCV 76. D-dimer 5.43. CTA not done given renal failure  But Ventilation/perfusion scan negative for pulmonary embolism (very low probability).  Chest x-ray reveals mild cardiomegaly, no acute findings.    Potassium 3.1, BUN 25, creatinine 1.87, alkaline phosphatase 462, albumin 2.9, AST 135, ALT 217, total bilirubin 3.9. Glucose 201.  Anion gap 29.   UA positive for ketones, only a few bacteria and negative nitrites. Large glucose  Abdominal ultrasound is pending.  Review of Systems:  Constitutional:  No weight loss, night sweats, ++ fever, ++ chills, fatigue.  HEENT:  No headaches, Difficulty swallowing,Tooth/dental problems,Sore throat,  No sneezing, itching, ear  ache, nasal congestion, post nasal drip,  Cardio-vascular:  No Orthopnea, PND, swelling in lower extremities, anasarca, dizziness, palpitations  GI:  No heartburn, indigestion, abdominal pain, nausea, vomiting, diarrhea, change in bowel habits, loss of appetite  Resp:  ++SOB at rest. No excess mucus, no productive cough, No non-productive cough, No coughing up of blood.No change in color of mucus.No wheezing.No chest wall deformity  Skin:  no rash or lesions.  GU:  no dysuria, ++change in color of urine, no urgency or frequency. No flank pain.  Musculoskeletal:  No joint pain or swelling. No decreased range of motion. No back pain.  Psych:  No change in mood or affect. No depression or anxiety. No memory loss.   Past Medical History  Diagnosis Date  . Diabetes mellitus without complication (HCC)   . Hyperlipidemia   . Hypertension     Social History:  reports that she has quit smoking. She has never used smokeless tobacco. Her alcohol and drug histories are not on file.   Patient lives at home, with husband. No assistive devices needed for ambulation  Allergies  Allergen Reactions  . Actos [Pioglitazone] Other (See Comments)    Headache and myalgia  . Statins Nausea Only    Family History  Problem Relation Age of Onset  . Asthma Other   . Hypertension Other   . Hyperlipidemia Other   . Diabetes Other   . Heart block Mother     Prior to Admission medications   Medication Sig Start Date End Date Taking? Authorizing Provider  aspirin 81 MG tablet Take 81 mg by mouth daily.   Yes Historical Provider,  MD  Canagliflozin (INVOKANA) 100 MG TABS Take 100 mg by mouth daily.   Yes Historical Provider, MD  Exenatide ER (BYDUREON) 2 MG SUSR Inject 2 mg into the skin once a week. Saturday   Yes Historical Provider, MD  lisinopril-hydrochlorothiazide (PRINZIDE,ZESTORETIC) 20-12.5 MG per tablet Take 1 tablet by mouth daily.   Yes Historical Provider, MD  metFORMIN (GLUCOPHAGE) 500 MG  tablet Take 1,000 mg by mouth 2 (two) times daily with a meal.   Yes Historical Provider, MD  Multiple Vitamins-Calcium (ONE-A-DAY WOMENS PO) Take 1 tablet by mouth daily.   Yes Historical Provider, MD  sitaGLIPtin (JANUVIA) 100 MG tablet Take 100 mg by mouth daily.   Yes Historical Provider, MD  ciprofloxacin (CIPRO) 500 MG tablet Take 500 mg by mouth 2 (two) times daily. For 7 days 10-7 to 10-14    Historical Provider, MD  diazepam (VALIUM) 5 MG tablet Take 1 tablet (5 mg total) by mouth every 8 (eight) hours as needed for anxiety. Patient not taking: Reported on 07/29/2014 07/17/14   Roxy Horseman, PA-C   Physical Exam: Filed Vitals:   10/20/14 1015 10/20/14 1030 10/20/14 1045 10/20/14 1225  BP: 111/70 110/71 117/74 113/84  Pulse: 109 112 108 118  Temp:      TempSrc:      Resp: 31 27 30  33  Height:      Weight:      SpO2: 96% 97% 96% 96%    Wt Readings from Last 3 Encounters:  10/20/14 87.544 kg (193 lb)  07/29/14 89.903 kg (198 lb 3.2 oz)  07/17/14 88.905 kg (196 lb)    General:  Pleasant, obese black female appears calm and comfortable Eyes: PER, normal lids, irises & conjunctiva ENT: grossly normal hearing, lips & tongue Neck: no LAD, masses or thyromegaly Cardiovascular:   RRR, no m/r/g. No LE edema. Telemetry: SR, no arrhythmias  Respiratory: + Tachypnea , CTA bilaterally, no w/r/r. Normal respiratory effort. Abdomen: soft, ntnd Skin: no rash or induration seen on limited exam Musculoskeletal: grossly normal tone BUE/BLE Psychiatric: grossly normal mood and affect, speech fluent and appropriate Neurologic: grossly non-focal.          Labs on Admission:  Basic Metabolic Panel:  Recent Labs Lab 10/20/14 0853  NA 140  K 3.1*  CL 94*  CO2 17*  GLUCOSE 201*  BUN 25*  CREATININE 1.87*  CALCIUM 9.1   Liver Function Tests:  Recent Labs Lab 10/20/14 0853  AST 135*  ALT 217*  ALKPHOS 462*  BILITOT 3.9*  PROT 7.2  ALBUMIN 2.9*     CBC:  Recent  Labs Lab 10/20/14 0853  WBC 18.7*  NEUTROABS 14.4*  HGB 13.0  HCT 42.1  MCV 76.7*  PLT 214   EKG: Sinus tachycardia Anterior infarct, old.  Vent. rate 109 BPM PR interval 134 ms QRS duration 77 ms QT/QTc 358/482 ms P-R-T axes 69 79 21  Assessment/Plan  Chest pain / dyspnea. Troponin 0.6 but no acute changes on EKG. Chest pain actually resolved two days ago. D-dimer > 5. Ventilation Perfusion scan - negative low probability. CXR negative. Chest pain probably from metabolic acidosis, cycling troponins.   Metabolic acidosis with respiratory alkalosis, anion gap 29. Lactic acid normal. Urine ketones > 80. Suspect DKA. Will check urine acetone. Check CPK to rule out rhabdomyolysis      Diabetes mellitus type 2, uncontrolled, with complications (HCC).  Suspect DKA. Glucose currently 201, ++ glycosuria, large urine ketones.  -Admit to stepdown -DKA protocol with  scheduled CBGs, insulin drip, potassium repletion.  -Hold home oral diabetic agents.   Leukocytosis. Probably secondary to DKA, recheck am CBC. Antibiotics discontinued.   Elevated Troponin.  ED Troponin 0.63, 0.6 at 5 hours. Elevation probably secondary to demand ischemia.  Hopefully Troponin will improve with correction of metabolic acidosis. No chest pain in 2 days now -Continue Heparin drip for now.  -Cycle troponins.  -Continue telemetry.  -obtain echocardiogram.   -Cardiology consult depending on clinical course   Hypokalemia. Will replete, cycle BMETs per DKA protocol   Consults: Discussed case over phone with Cardiology.   Code Status: Full code DVT Prophylaxis: On heparin drip Family Communication: Patient alert, oriented and understands plan of care.  Disposition Plan:  Home in 48-72 hours  Time spent: 60 minutes  Willette Cluster , NP [facility] Pager [phone]

## 2014-10-20 NOTE — ED Notes (Signed)
Pt remains in radiology 

## 2014-10-21 ENCOUNTER — Inpatient Hospital Stay (HOSPITAL_COMMUNITY): Payer: 59

## 2014-10-21 DIAGNOSIS — R079 Chest pain, unspecified: Secondary | ICD-10-CM

## 2014-10-21 DIAGNOSIS — R748 Abnormal levels of other serum enzymes: Secondary | ICD-10-CM | POA: Diagnosis present

## 2014-10-21 DIAGNOSIS — R739 Hyperglycemia, unspecified: Secondary | ICD-10-CM | POA: Diagnosis present

## 2014-10-21 DIAGNOSIS — I5032 Chronic diastolic (congestive) heart failure: Secondary | ICD-10-CM | POA: Diagnosis present

## 2014-10-21 LAB — BASIC METABOLIC PANEL
ANION GAP: 12 (ref 5–15)
ANION GAP: 16 — AB (ref 5–15)
Anion gap: 12 (ref 5–15)
Anion gap: 11 (ref 5–15)
Anion gap: 11 (ref 5–15)
BUN: 18 mg/dL (ref 6–20)
BUN: 12 mg/dL (ref 6–20)
BUN: 12 mg/dL (ref 6–20)
BUN: 10 mg/dL (ref 6–20)
BUN: 9 mg/dL (ref 6–20)
CALCIUM: 7.8 mg/dL — AB (ref 8.9–10.3)
CHLORIDE: 102 mmol/L (ref 101–111)
CHLORIDE: 102 mmol/L (ref 101–111)
CHLORIDE: 105 mmol/L (ref 101–111)
CHLORIDE: 98 mmol/L — AB (ref 101–111)
CO2: 20 mmol/L — AB (ref 22–32)
CO2: 20 mmol/L — ABNORMAL LOW (ref 22–32)
CO2: 24 mmol/L (ref 22–32)
CO2: 24 mmol/L (ref 22–32)
CO2: 25 mmol/L (ref 22–32)
CREATININE: 0.94 mg/dL (ref 0.44–1.00)
Calcium: 7.5 mg/dL — ABNORMAL LOW (ref 8.9–10.3)
Calcium: 7.8 mg/dL — ABNORMAL LOW (ref 8.9–10.3)
Calcium: 8 mg/dL — ABNORMAL LOW (ref 8.9–10.3)
Calcium: 8.1 mg/dL — ABNORMAL LOW (ref 8.9–10.3)
Chloride: 99 mmol/L — ABNORMAL LOW (ref 101–111)
Creatinine, Ser: 1.22 mg/dL — ABNORMAL HIGH (ref 0.44–1.00)
Creatinine, Ser: 0.88 mg/dL (ref 0.44–1.00)
Creatinine, Ser: 0.91 mg/dL (ref 0.44–1.00)
Creatinine, Ser: 0.8 mg/dL (ref 0.44–1.00)
GFR calc non Af Amer: 47 mL/min — ABNORMAL LOW (ref >60)
GFR calc non Af Amer: >60 mL/min (ref >60)
GFR calc non Af Amer: >60 mL/min (ref >60)
GFR calc non Af Amer: >60 mL/min (ref >60)
GFR calc non Af Amer: >60 mL/min (ref >60)
GFR, EST AFRICAN AMERICAN: 55 mL/min — AB (ref >60)
Glucose, Bld: 160 mg/dL — ABNORMAL HIGH (ref 65–99)
Glucose, Bld: 143 mg/dL — ABNORMAL HIGH (ref 65–99)
Glucose, Bld: 145 mg/dL — ABNORMAL HIGH (ref 65–99)
Glucose, Bld: 147 mg/dL — ABNORMAL HIGH (ref 65–99)
Glucose, Bld: 132 mg/dL — ABNORMAL HIGH (ref 65–99)
POTASSIUM: 3.3 mmol/L — AB (ref 3.5–5.1)
POTASSIUM: 3.6 mmol/L (ref 3.5–5.1)
POTASSIUM: 3.7 mmol/L (ref 3.5–5.1)
POTASSIUM: 3.2 mmol/L — AB (ref 3.5–5.1)
Potassium: 3.6 mmol/L (ref 3.5–5.1)
SODIUM: 136 mmol/L (ref 135–145)
SODIUM: 138 mmol/L (ref 135–145)
SODIUM: 138 mmol/L (ref 135–145)
SODIUM: 134 mmol/L — AB (ref 135–145)
Sodium: 135 mmol/L (ref 135–145)

## 2014-10-21 LAB — GLUCOSE, CAPILLARY
GLUCOSE-CAPILLARY: 134 mg/dL — AB (ref 65–99)
GLUCOSE-CAPILLARY: 140 mg/dL — AB (ref 65–99)
GLUCOSE-CAPILLARY: 141 mg/dL — AB (ref 65–99)
GLUCOSE-CAPILLARY: 145 mg/dL — AB (ref 65–99)
GLUCOSE-CAPILLARY: 148 mg/dL — AB (ref 65–99)
GLUCOSE-CAPILLARY: 148 mg/dL — AB (ref 65–99)
GLUCOSE-CAPILLARY: 151 mg/dL — AB (ref 65–99)
GLUCOSE-CAPILLARY: 152 mg/dL — AB (ref 65–99)
GLUCOSE-CAPILLARY: 155 mg/dL — AB (ref 65–99)
Glucose-Capillary: 145 mg/dL — ABNORMAL HIGH (ref 65–99)
Glucose-Capillary: 145 mg/dL — ABNORMAL HIGH (ref 65–99)
Glucose-Capillary: 162 mg/dL — ABNORMAL HIGH (ref 65–99)
Glucose-Capillary: 160 mg/dL — ABNORMAL HIGH (ref 65–99)
Glucose-Capillary: 153 mg/dL — ABNORMAL HIGH (ref 65–99)
Glucose-Capillary: 140 mg/dL — ABNORMAL HIGH (ref 65–99)
Glucose-Capillary: 144 mg/dL — ABNORMAL HIGH (ref 65–99)
Glucose-Capillary: 137 mg/dL — ABNORMAL HIGH (ref 65–99)
Glucose-Capillary: 147 mg/dL — ABNORMAL HIGH (ref 65–99)
Glucose-Capillary: 128 mg/dL — ABNORMAL HIGH (ref 65–99)
Glucose-Capillary: 141 mg/dL — ABNORMAL HIGH (ref 65–99)
Glucose-Capillary: 137 mg/dL — ABNORMAL HIGH (ref 65–99)
Glucose-Capillary: 133 mg/dL — ABNORMAL HIGH (ref 65–99)
Glucose-Capillary: 130 mg/dL — ABNORMAL HIGH (ref 65–99)
Glucose-Capillary: 136 mg/dL — ABNORMAL HIGH (ref 65–99)

## 2014-10-21 LAB — COMPREHENSIVE METABOLIC PANEL
ALBUMIN: 2.4 g/dL — AB (ref 3.5–5.0)
ALT: 222 U/L — AB (ref 14–54)
AST: 179 U/L — AB (ref 15–41)
Alkaline Phosphatase: 410 U/L — ABNORMAL HIGH (ref 38–126)
Anion gap: 13 (ref 5–15)
BILIRUBIN TOTAL: 2.2 mg/dL — AB (ref 0.3–1.2)
BUN: 16 mg/dL (ref 6–20)
CO2: 23 mmol/L (ref 22–32)
CREATININE: 1.09 mg/dL — AB (ref 0.44–1.00)
Calcium: 7.6 mg/dL — ABNORMAL LOW (ref 8.9–10.3)
Chloride: 100 mmol/L — ABNORMAL LOW (ref 101–111)
GFR calc Af Amer: >60 mL/min (ref >60)
GFR calc non Af Amer: 54 mL/min — ABNORMAL LOW (ref >60)
GLUCOSE: 155 mg/dL — AB (ref 65–99)
POTASSIUM: 3.1 mmol/L — AB (ref 3.5–5.1)
Sodium: 136 mmol/L (ref 135–145)
TOTAL PROTEIN: 6.3 g/dL — AB (ref 6.5–8.1)

## 2014-10-21 LAB — CBC
HEMATOCRIT: 36 % (ref 36.0–46.0)
HEMOGLOBIN: 11.5 g/dL — AB (ref 12.0–15.0)
MCH: 24.3 pg — ABNORMAL LOW (ref 26.0–34.0)
MCHC: 31.9 g/dL (ref 30.0–36.0)
MCV: 75.9 fL — ABNORMAL LOW (ref 78.0–100.0)
Platelets: 185 10*3/uL (ref 150–400)
RBC: 4.74 MIL/uL (ref 3.87–5.11)
RDW: 15.3 % (ref 11.5–15.5)
WBC: 13.9 10*3/uL — AB (ref 4.0–10.5)

## 2014-10-21 LAB — TROPONIN I
Troponin I: 0.29 ng/mL — ABNORMAL HIGH (ref <0.031)
Troponin I: 0.2 ng/mL — ABNORMAL HIGH (ref <0.031)

## 2014-10-21 LAB — HEPARIN LEVEL (UNFRACTIONATED)
HEPARIN UNFRACTIONATED: 0.22 [IU]/mL — AB (ref 0.30–0.70)
HEPARIN UNFRACTIONATED: 0.31 [IU]/mL (ref 0.30–0.70)

## 2014-10-21 LAB — URINE CULTURE: Culture: NO GROWTH

## 2014-10-21 MED ORDER — POTASSIUM CHLORIDE 10 MEQ/100ML IV SOLN
10.0000 meq | INTRAVENOUS | Status: AC
  Administered 2014-10-21 (×4): 10 meq via INTRAVENOUS
  Filled 2014-10-21 (×4): qty 100

## 2014-10-21 MED ORDER — POTASSIUM CHLORIDE 10 MEQ/100ML IV SOLN: 10.0000 meq | INTRAVENOUS | Status: DC

## 2014-10-21 MED ORDER — INSULIN ASPART 100 UNIT/ML ~~LOC~~ SOLN
0.0000 [IU] | SUBCUTANEOUS | Status: DC
  Administered 2014-10-21 – 2014-10-22 (×3): 1 [IU] via SUBCUTANEOUS

## 2014-10-21 MED ORDER — INSULIN GLARGINE 100 UNIT/ML ~~LOC~~ SOLN
10.0000 [IU] | Freq: Every day | SUBCUTANEOUS | Status: DC
  Administered 2014-10-21 – 2014-10-22 (×2): 10 [IU] via SUBCUTANEOUS
  Filled 2014-10-21 (×2): qty 0.1

## 2014-10-21 NOTE — Progress Notes (Signed)
  Echocardiogram 2D Echocardiogram has been performed.  Brittany [name] M 10/21/2014, 11:42 AM

## 2014-10-21 NOTE — Progress Notes (Signed)
Dr. Joseph Art notified by text of 1600 BMET results posted.

## 2014-10-21 NOTE — Progress Notes (Signed)
Dr. Joseph Art notified by text that the BMET results from noon are back.

## 2014-10-21 NOTE — Progress Notes (Signed)
UR COMPLETED  

## 2014-10-21 NOTE — Progress Notes (Signed)
Left antecubital IV with heparin infiltrated.  Some ecchymosis at site.  IV D/Cd cath tip intact.  Ice pack placed to area.

## 2014-10-21 NOTE — Progress Notes (Signed)
ANTICOAGULATION CONSULT NOTE  Pharmacy Consult for Heparin Indication: chest pain/ACS  Allergies  Allergen Reactions  . Actos [Pioglitazone] Other (See Comments)    Headache and myalgia  . Statins Nausea Only    Patient Measurements: Height:  (160 cm) Weight: 193 lb 8 oz (87.771 kg) IBW/kg (Calculated) : 52.4 Heparin Dosing Weight: 72.1  Vital Signs: Temp: 98.2 F (36.8 C) (10/11 1359) Temp Source: Oral (10/11 1359) BP: 126/84 mmHg (10/11 0737) Pulse Rate: 110 (10/11 0737)  Labs:  Recent Labs  10/20/14 0853 10/20/14 1716  10/20/14 2241  10/21/14 0417 10/21/14 1222 10/21/14 1353  HGB 13.0  --   --   --   --  11.5*  --   --   HCT 42.1  --   --   --   --  36.0  --   --   PLT 214  --   --   --   --  185  --   --   HEPARINUNFRC  --  0.31  --   --   --  0.22*  --  0.31  CREATININE 1.87* 1.47*  < >  --   < > 1.09* 0.94 0.88  CKTOTAL  --  342*  --   --   --   --   --   --   TROPONINI  --  0.41*  --  0.31*  --   --   --  0.29*  < > = values in this interval not displayed.  Estimated Creatinine Clearance: 71.5 mL/min (by C-G formula based on Cr of 0.88).  Assessment: 59yoF on heparin for CP. Trop elevated to 0.41 - now trending down. Heparin level 0.31 (low end of therapeutic). No bleeding noted.  Goal of Therapy:  Heparin level 0.3-0.7 units/ml Monitor platelets by anticoagulation protocol: Yes   Plan:  - Increase heparin gtt slightly to 1100 units/hr - F/u daily heparin level and CBC  Christoper Fabian, PharmD, BCPS Clinical pharmacist, pager [phone]  10/21/2014 3:09 PM

## 2014-10-21 NOTE — Care Management Note (Addendum)
Case Management Note  Patient Details  Name: Brittany [name] MRN: [id number] Date of Birth: 12-18-1955  Subjective/Objective:              From home with spouse admitted with metabolic acidosis secondary to DKA, hx of  hyperlipidemia, hypertension. Independent with ADL's.       Action/Plan Return to home when medically stable. CM to f/u with d/c disposition.  Expected Discharge Date:                  Expected Discharge Plan:  Home/Self Care  In-House Referral:     Discharge planning Services  CM Consult  Post Acute Care Choice:    Choice offered to:     DME Arranged:    DME Agency:     HH Arranged:    HH Agency:     Status of Service:  In process, will continue to follow  Medicare Important Message Given:    Date Medicare IM Given:    Medicare IM give by:    Date Additional Medicare IM Given:    Additional Medicare Important Message give by:     If discussed at Long Length of Stay Meetings, dates discussed:    Additional Comments: NIANG MITCHELTREE  [phone]   Gae Gallop [place], [place] [phone] 10/21/2014, 8:13 PM

## 2014-10-21 NOTE — Progress Notes (Signed)
Inpatient Diabetes Program Recommendations  AACE/ADA: New Consensus Statement on Inpatient Glycemic Control (2015)  Target Ranges:  Prepandial:   less than 140 mg/dL      Peak postprandial:   less than 180 mg/dL (1-2 hours)      Critically ill patients:  140 - 180 mg/dL   Results for Brittany [name], Brittany [name] (MRN [id number]) as of 10/21/2014 11:09  Ref. Range 10/20/2014 08:53  Sodium Latest Ref Range: 135-145 mmol/[name] 140  Potassium Latest Ref Range: 3.5-5.1 mmol/[name] 3.1 ([name])  Chloride Latest Ref Range: 101-111 mmol/[name] 94 ([name])  CO2 Latest Ref Range: 22-32 mmol/[name] 17 ([name])  BUN Latest Ref Range: 6-20 mg/dL 25 (H)  Creatinine Latest Ref Range: 0.44-1.00 mg/dL 1.87 (H)  Calcium Latest Ref Range: 8.9-10.3 mg/dL 9.1  EGFR (Non-African Amer.) Latest Ref Range: >60 mL/min 28 ([name])  EGFR (African American) Latest Ref Range: >60 mL/min 33 ([name])  Glucose Latest Ref Range: 65-99 mg/dL 201 (H)  Anion gap Latest Ref Range: 5-15  29 (H)    Home DM Meds: Invokana 100 mg daily        Metformin 1000 mg bid        Januvia 100 mg daily  Current Insulin Orders: IV Insulin drip per DKA Protocol Order set   "59 y.o. female with a history of diabetes and hypertension in the ED with shortness of breath . Patient saw PCP Friday for change in urine color, she apparently had a low-grade temp at the time. She was started on Cipro for presumed UTI . After a dose of Cipro on Friday patient developed shortness of breath and left-sided chest discomfort. She stopped Cipro after 3rd dose on Saturday because of persistent SOB and chest discomfort."    -Concern that patient may have been at high risk for DKA secondary to the Invokana she is taking at home.    -Would Not restart her Invokana in the hospital once patient is transitioned off the IV insulin drip.    -Recommend we transition patient to Novolog SSI when she is ready to transition off the IV insulin drip.    -Per 4am BMET today, CO2 was up to 23 and Anion Gap was down to  13.    MD- When patient is ready to transition off IV insulin drip, please start Novolog Moderate SSI.  Not sure patient will need basal insulin to transition off the IV insulin drip.  Requiring very little IV insulin to keep CBGs WNL.    ----Will follow patient during hospitalization----  Jeannine Johnston Fishel RN, MSN, CDE Diabetes Coordinator Inpatient Glycemic Control Team Team Pager: [phone] (8a-5p)       

## 2014-10-21 NOTE — Progress Notes (Signed)
[employer] TEAM 1 - Stepdown/ICU TEAM Progress Note  Brittany [name] ZOX:[id number] DOB: 1955-04-21 DOA: 10/20/2014 PCP: Lupe Carney, MD  Admit HPI / Brief Narrative: Brittany [name] is a 59 y.o. female with a history of diabetes and hypertension in the ED with shortness of breath . Patient saw PCP Friday for change in urine color, she apparently had a low-grade temp at the time. She was started on Cipro for presumed UTI . After a dose of Cipro on Friday patient developed shortness of breath and left-sided chest discomfort. She stopped Cipro after 3rd dose on Saturday because of persistent SOB and chest discomfort. After discontinuation of the anti-biotic her chest discomfort subsided but the shortness of breath persisted. No cough , she has had chills and low-grade temp since Friday . No other complaints. In the emergency department patient has multiple lab abnormalities.  Patient tachycardic with heart rate of 117, respirations rapid in the thirties. Afebrile. Oxygen saturation 95% on room air. ABGs - pH 7.3, PCO2 low at 30.4, PO2 99, bicarbonate 16. Troponin elevated at 0.63, EKG reveals sinus tachycardia at 150 bpm, anterior infarct, age undetermined. Hemoglobin normal at13, MCV 76. D-dimer 5.43. CTA not done given renal failure But Ventilation/perfusion scan negative for pulmonary embolism (very low probability). Chest x-ray reveals mild cardiomegaly, no acute findings.   Potassium 3.1, BUN 25, creatinine 1.87, alkaline phosphatase 462, albumin 2.9, AST 135, ALT 217, total bilirubin 3.9. Glucose 201. Anion gap 29.   UA positive for ketones, only a few bacteria and negative nitrites. Large glucose  Abdominal ultrasound is pending.  HPI/Subjective: 10/11 A/O 4, negative CP, positive continued SOB. Patient states does not use home O2.   Assessment/Plan: Chest pain / dyspnea.  -Troponin 0.6 but no acute changes on EKG. Chest pain actually resolved two days ago.  -D-dimer > 5.  Ventilation Perfusion scan - negative low probability.  -CXR negative. Chest pain probably from metabolic acidosis, cycling troponins.   Metabolic acidosis with respiratory alkalosis, -Resolved -CK mildly elevated, will monitor  DKA/Diabetes mellitus type 2, uncontrolled, with complications (HCC).  Suspect DKA. Glucose currently 201, ++ glycosuria, large urine ketones.  -5/13 hemoglobin A1c= 7.6 -Admit to stepdown -DKA protocol with scheduled CBGs, insulin drip, potassium repletion.  -Transition patient to Lantus 10 units, continue glucose stabilizer protocol for 2 hours then DC  -Start Sensitive SSI after glucose stabilizer DC'd  Hypokalemia -Potassium IV 40 mEq  Leukocytosis.  -Probably secondary to DKA, recheck am CBC. Antibiotics discontinued.   Diastolic CHF -mild, see echocardiogram results below  Elevated Troponin.  -ED Troponin 0.63, 0.6 at 5 hours. Elevation probably secondary to demand ischemia. Trending down will continue to trend  -No chest pain in 2 days now -Continue Heparin drip for now.  -Echocardiogram; see results below   Elevated liver enzymes -Possibly secondary to shock liver, trend enzymes    Code Status: FULL Family Communication: no family present at time of exam Disposition Plan: Resolution DKA    Consultants: NP Willette Cluster  Discussed case over phone with Cardiology    Procedure/Significant Events: 10/11 echocardiogram;- Left ventricle: mild LVH. -LVEF=50% to 55%. - (grade 1 diastolic dysfunction).   Culture 10/10 urine negative 10/10 blood left forearm/arm NGTD 10/10 MRSA by PCR negative  Antibiotics: None  DVT prophylaxis: Heparin drip   Devices    LINES / TUBES:      Continuous Infusions: . sodium chloride 150 mL/hr at 10/20/14 1630  . dextrose 5 % and 0.45% NaCl 125 mL/hr at  10/21/14 0700  . heparin 1,100 Units/hr (10/21/14 1530)  . insulin (NOVOLIN-R) infusion Stopped (10/21/14 1606)     Objective: VITAL SIGNS: Temp: 98.2 F (36.8 C) (10/11 1359) Temp Source: Oral (10/11 1359) BP: 132/77 mmHg (10/11 1531) Pulse Rate: 110 (10/11 1531) SPO2; FIO2:   Intake/Output Summary (Last 24 hours) at 10/21/14 1736 Last data filed at 10/21/14 1529  Gross per 24 hour  Intake 2788.5 ml  Output   2285 ml  Net  503.5 ml     Exam: General: A/O 4, positive acute respiratory distress Eyes: Negative headache, eye pain, double vision,negative scleral hemorrhage ENT: Negative Runny nose, negative ear pain, negative gingival bleeding, Neck:  Negative scars, masses, torticollis, lymphadenopathy, JVD Lungs: Clear to auscultation bilaterally without wheezes or crackles Cardiovascular: Tachycardic, Regular rhythm without murmur gallop or rub normal S1 and S2 Abdomen:negative abdominal pain, nondistended, positive soft, bowel sounds, no rebound, no ascites, no appreciable mass Extremities: No significant cyanosis, clubbing, or edema bilateral lower extremities Psychiatric:  Negative depression, negative anxiety, negative fatigue, negative mania  Neurologic:  Cranial nerves II through XII intact, tongue/uvula midline, all extremities muscle strength 5/5, sensation intact throughout, negative dysarthria, negative expressive aphasia, negative receptive aphasia.   Data Reviewed: Basic Metabolic Panel:  Recent Labs Lab 10/20/14 1716  10/21/14 0015 10/21/14 0417 10/21/14 1222 10/21/14 1353 10/21/14 1550  NA 137  < > 134* 136 135 136 138  K 3.2*  < > 3.2* 3.1* 3.6 3.7 3.6  CL 97*  < > 98* 100* 99* 105 102  CO2 19*  < > 20* 23 24 20* 25  GLUCOSE 132*  < > 160* 155* 143* 145* 147*  BUN 22*  < > CREATININE 1.47*  < > 1.22* 1.09* 0.94 0.88 0.91  CALCIUM 7.7*  < > 7.5* 7.6* 7.8* 7.8* 8.0*  MG 2.7*  --   --   --   --   --   --   PHOS 3.8  --   --   --   --   --   --   < > = values in this interval not displayed. Liver Function Tests:  Recent Labs Lab  10/20/14 0853 10/21/14 0417  AST 135* 179*  ALT 217* 222*  ALKPHOS 462* 410*  BILITOT 3.9* 2.2*  PROT 7.2 6.3*  ALBUMIN 2.9* 2.4*    Recent Labs Lab 10/20/14 1426  LIPASE 118*   No results for input(s): AMMONIA in the last 168 hours. CBC:  Recent Labs Lab 10/20/14 0853 10/21/14 0417  WBC 18.7* 13.9*  NEUTROABS 14.4*  --   HGB 13.0 11.5*  HCT 42.1 36.0  MCV 76.7* 75.9*  PLT 214 185   Cardiac Enzymes:  Recent Labs Lab 10/20/14 1716 10/20/14 2241 10/21/14 1353  CKTOTAL 342*  --   --   TROPONINI 0.41* 0.31* 0.29*   BNP (last 3 results) No results for input(s): BNP in the last 8760 hours.  ProBNP (last 3 results) No results for input(s): PROBNP in the last 8760 hours.  CBG:  Recent Labs Lab 10/21/14 1259 10/21/14 1356 10/21/14 1451 10/21/14 1604 10/21/14 1658  GLUCAP 141* 155* 141* 134* 137*    Recent Results (from the past 240 hour(s))  Urine culture     Status: None   Collection Time: 10/20/14 10:10 AM  Result Value Ref Range Status   Specimen Description URINE, RANDOM  Final   Special Requests NONE  Final   Culture NO GROWTH 1  DAY  Final   Report Status 10/21/2014 FINAL  Final  Blood culture (routine x 2)     Status: None (Preliminary result)   Collection Time: 10/20/14 10:45 AM  Result Value Ref Range Status   Specimen Description BLOOD LEFT FOREARM  Final   Special Requests BOTTLES DRAWN AEROBIC AND ANAEROBIC  Final   Culture NO GROWTH 1 DAY  Final   Report Status PENDING  Incomplete  Blood culture (routine x 2)     Status: None (Preliminary result)   Collection Time: 10/20/14 11:10 AM  Result Value Ref Range Status   Specimen Description BLOOD LEFT ARM  Final   Special Requests BOTTLES DRAWN AEROBIC AND ANAEROBIC  Final   Culture NO GROWTH 1 DAY  Final   Report Status PENDING  Incomplete  MRSA PCR Screening     Status: None   Collection Time: 10/20/14  7:07 PM  Result Value Ref Range Status   MRSA by PCR NEGATIVE NEGATIVE  Final    Comment:        The GeneXpert MRSA Assay (FDA approved for NASAL specimens only), is one component of a comprehensive MRSA colonization surveillance program. It is not intended to diagnose MRSA infection nor to guide or monitor treatment for MRSA infections.      Studies:  Recent x-ray studies have been reviewed in detail by the Attending Physician  Scheduled Meds:  Scheduled Meds: . aspirin  81 mg Oral Daily  . docusate sodium  100 mg Oral BID  . insulin aspart  0-9 Units Subcutaneous 6 times per day  . insulin glargine  10 Units Subcutaneous Daily    Time spent on care of this patient: 40 mins   Lindel Marcell, Roselind Messier , MD  [facility] Office  [phone] Pager [phone]  On-Call/Text Page:      [email or web address]      password [email or web address]  If 7PM-7AM, please contact night-coverage [email or web address] Password [email or web address] 10/21/2014, 5:36 PM   LOS: 1 day   Care during the described time interval was provided by me .  I have reviewed this patient's available data, including medical history, events of note, physical examination, and all test results as part of my evaluation. I have personally reviewed and interpreted all radiology studies.   Carolyne Littles, MD [phone] Pager

## 2014-10-21 NOTE — Progress Notes (Signed)
ANTICOAGULATION CONSULT NOTE  Pharmacy Consult for Heparin Indication: chest pain/ACS  Allergies  Allergen Reactions  . Actos [Pioglitazone] Other (See Comments)    Headache and myalgia  . Statins Nausea Only    Patient Measurements: Height:  (160 cm) Weight: 193 lb 8 oz (87.771 kg) IBW/kg (Calculated) : 52.4 Heparin Dosing Weight: 72.1  Vital Signs: Temp: 98.2 F (36.8 C) (10/11 0324) Temp Source: Oral (10/11 0324) BP: 114/68 mmHg (10/11 0324) Pulse Rate: 105 (10/11 0324)  Labs:  Recent Labs  10/20/14 0853 10/20/14 1716 10/20/14 2022 10/20/14 2241 10/21/14 0015 10/21/14 0417  HGB 13.0  --   --   --   --  11.5*  HCT 42.1  --   --   --   --  36.0  PLT 214  --   --   --   --  185  HEPARINUNFRC  --  0.31  --   --   --  0.22*  CREATININE 1.87* 1.47* 1.28*  --  1.22*  --   CKTOTAL  --  342*  --   --   --   --   TROPONINI  --  0.41*  --  0.31*  --   --     Estimated Creatinine Clearance: 51.6 mL/min (by C-G formula based on Cr of 1.22).  Assessment: 59yoF admitted with chest pain. Pharmacy consulted to dose heparin for ACS. No anti-coag PTA. CBC stable. No report of bleeding  Heparin level is now subtherapeutic.   Goal of Therapy:  Heparin level 0.3-0.7 units/ml Monitor platelets by anticoagulation protocol: Yes   Plan:  - Increase heparin gtt to 1000 units/hr - Check an 8 hour heparin level  Lysle Pearl, PharmD, BCPS Pager # [phone] 10/21/2014 5:20 AM

## 2014-10-22 ENCOUNTER — Inpatient Hospital Stay (HOSPITAL_COMMUNITY): Payer: 59

## 2014-10-22 DIAGNOSIS — E876 Hypokalemia: Secondary | ICD-10-CM

## 2014-10-22 DIAGNOSIS — R748 Abnormal levels of other serum enzymes: Secondary | ICD-10-CM

## 2014-10-22 DIAGNOSIS — E1165 Type 2 diabetes mellitus with hyperglycemia: Secondary | ICD-10-CM

## 2014-10-22 DIAGNOSIS — I5032 Chronic diastolic (congestive) heart failure: Secondary | ICD-10-CM

## 2014-10-22 DIAGNOSIS — E118 Type 2 diabetes mellitus with unspecified complications: Secondary | ICD-10-CM

## 2014-10-22 LAB — BASIC METABOLIC PANEL
Anion gap: 13 (ref 5–15)
Anion gap: 16 — ABNORMAL HIGH (ref 5–15)
BUN: 6 mg/dL (ref 6–20)
BUN: 7 mg/dL (ref 6–20)
CHLORIDE: 99 mmol/L — AB (ref 101–111)
CHLORIDE: 100 mmol/L — AB (ref 101–111)
CO2: 25 mmol/L (ref 22–32)
CO2: 23 mmol/L (ref 22–32)
CREATININE: 0.82 mg/dL (ref 0.44–1.00)
CREATININE: 0.8 mg/dL (ref 0.44–1.00)
Calcium: 7.9 mg/dL — ABNORMAL LOW (ref 8.9–10.3)
Calcium: 8.1 mg/dL — ABNORMAL LOW (ref 8.9–10.3)
GFR calc Af Amer: >60 mL/min (ref >60)
GFR calc non Af Amer: >60 mL/min (ref >60)
GFR calc non Af Amer: >60 mL/min (ref >60)
Glucose, Bld: 127 mg/dL — ABNORMAL HIGH (ref 65–99)
Glucose, Bld: 149 mg/dL — ABNORMAL HIGH (ref 65–99)
POTASSIUM: 3 mmol/L — AB (ref 3.5–5.1)
Potassium: 3.7 mmol/L (ref 3.5–5.1)
SODIUM: 137 mmol/L (ref 135–145)
Sodium: 139 mmol/L (ref 135–145)

## 2014-10-22 LAB — GLUCOSE, CAPILLARY
GLUCOSE-CAPILLARY: 126 mg/dL — AB (ref 65–99)
GLUCOSE-CAPILLARY: 134 mg/dL — AB (ref 65–99)
GLUCOSE-CAPILLARY: 182 mg/dL — AB (ref 65–99)
Glucose-Capillary: 127 mg/dL — ABNORMAL HIGH (ref 65–99)
Glucose-Capillary: 117 mg/dL — ABNORMAL HIGH (ref 65–99)
Glucose-Capillary: 127 mg/dL — ABNORMAL HIGH (ref 65–99)

## 2014-10-22 LAB — CBC
HEMATOCRIT: 35.7 % — AB (ref 36.0–46.0)
Hemoglobin: 11.6 g/dL — ABNORMAL LOW (ref 12.0–15.0)
MCH: 24.6 pg — AB (ref 26.0–34.0)
MCHC: 32.5 g/dL (ref 30.0–36.0)
MCV: 75.8 fL — AB (ref 78.0–100.0)
Platelets: 211 10*3/uL (ref 150–400)
RBC: 4.71 MIL/uL (ref 3.87–5.11)
RDW: 15.2 % (ref 11.5–15.5)
WBC: 14.3 10*3/uL — ABNORMAL HIGH (ref 4.0–10.5)

## 2014-10-22 LAB — TROPONIN I: Troponin I: 0.17 ng/mL — ABNORMAL HIGH (ref <0.031)

## 2014-10-22 LAB — OCCULT BLOOD X 1 CARD TO LAB, STOOL: FECAL OCCULT BLD: NEGATIVE

## 2014-10-22 LAB — HEPARIN LEVEL (UNFRACTIONATED): Heparin Unfractionated: 0.33 IU/mL (ref 0.30–0.70)

## 2014-10-22 LAB — MRSA PCR SCREENING: MRSA by PCR: NEGATIVE

## 2014-10-22 MED ORDER — SODIUM CHLORIDE 0.9 % IV SOLN
INTRAVENOUS | Status: DC
  Administered 2014-10-22: 04:00:00 via INTRAVENOUS

## 2014-10-22 MED ORDER — ENOXAPARIN SODIUM 40 MG/0.4ML ~~LOC~~ SOLN
40.0000 mg | SUBCUTANEOUS | Status: DC
  Administered 2014-10-22 – 2014-10-24 (×3): 40 mg via SUBCUTANEOUS
  Filled 2014-10-22 (×3): qty 0.4

## 2014-10-22 MED ORDER — LINAGLIPTIN 5 MG PO TABS
5.0000 mg | ORAL_TABLET | Freq: Every day | ORAL | Status: DC
  Administered 2014-10-22 – 2014-10-24 (×3): 5 mg via ORAL
  Filled 2014-10-22 (×3): qty 1

## 2014-10-22 MED ORDER — BYDUREON 2 MG ~~LOC~~ SUSR: 2.0000 mg | SUBCUTANEOUS | Status: DC

## 2014-10-22 MED ORDER — FUROSEMIDE 10 MG/ML IJ SOLN
40.0000 mg | Freq: Once | INTRAMUSCULAR | Status: AC
  Administered 2014-10-22: 40 mg via INTRAVENOUS
  Filled 2014-10-22: qty 4

## 2014-10-22 MED ORDER — INSULIN ASPART 100 UNIT/ML ~~LOC~~ SOLN
0.0000 [IU] | Freq: Three times a day (TID) | SUBCUTANEOUS | Status: DC
  Administered 2014-10-22 (×2): 1 [IU] via SUBCUTANEOUS
  Administered 2014-10-23: 2 [IU] via SUBCUTANEOUS
  Administered 2014-10-23: 1 [IU] via SUBCUTANEOUS
  Administered 2014-10-23 – 2014-10-24 (×3): 2 [IU] via SUBCUTANEOUS

## 2014-10-22 MED ORDER — HYDROCHLOROTHIAZIDE 12.5 MG PO CAPS
12.5000 mg | ORAL_CAPSULE | Freq: Every day | ORAL | Status: DC
  Administered 2014-10-22 – 2014-10-24 (×3): 12.5 mg via ORAL
  Filled 2014-10-22 (×3): qty 1

## 2014-10-22 MED ORDER — CANAGLIFLOZIN 100 MG PO TABS: 100.0000 mg | ORAL_TABLET | Freq: Every day | ORAL | Status: DC

## 2014-10-22 MED ORDER — METOPROLOL TARTRATE 12.5 MG HALF TABLET
12.5000 mg | ORAL_TABLET | Freq: Two times a day (BID) | ORAL | Status: DC
  Administered 2014-10-22 – 2014-10-24 (×4): 12.5 mg via ORAL
  Filled 2014-10-22 (×6): qty 1

## 2014-10-22 MED ORDER — LISINOPRIL-HYDROCHLOROTHIAZIDE 20-12.5 MG PO TABS
1.0000 | ORAL_TABLET | Freq: Every day | ORAL | Status: DC
Start: 2014-10-22 — End: 2014-10-22

## 2014-10-22 MED ORDER — LISINOPRIL 20 MG PO TABS
20.0000 mg | ORAL_TABLET | Freq: Every day | ORAL | Status: DC
  Administered 2014-10-22 – 2014-10-24 (×3): 20 mg via ORAL
  Filled 2014-10-22 (×3): qty 1

## 2014-10-22 NOTE — Progress Notes (Signed)
ANTICOAGULATION CONSULT NOTE  Pharmacy Consult for Heparin Indication: chest pain/ACS  Allergies  Allergen Reactions  . Actos [Pioglitazone] Other (See Comments)    Headache and myalgia  . Statins Nausea Only    Patient Measurements: Height: 5\' 3"  (160 cm) Weight: 193 lb 8 oz (87.771 kg) IBW/kg (Calculated) : 52.4 Heparin Dosing Weight: 72.1  Vital Signs: Temp: 98.3 F (36.8 C) (10/12 0754) Temp Source: Oral (10/12 0754) BP: 139/79 mmHg (10/12 0754) Pulse Rate: 109 (10/12 0754)  Labs:  Recent Labs  10/20/14 0853  10/20/14 1716  10/21/14 0417  10/21/14 1353 10/21/14 1550 10/21/14 1915 10/21/14 2300 10/22/14 0524  HGB 13.0  --   --   --  11.5*  --   --   --   --   --  11.6*  HCT 42.1  --   --   --  36.0  --   --   --   --   --  35.7*  PLT 214  --   --   --  185  --   --   --   --   --  211  HEPARINUNFRC  --   < > 0.31  --  0.22*  --  0.31  --   --   --  0.33  CREATININE 1.87*  --  1.47*  < > 1.09*  < > 0.88 0.91 0.80 0.82 0.80  CKTOTAL  --   --  342*  --   --   --   --   --   --   --   --   TROPONINI  --   --  0.41*  < >  --   --  0.29* 0.20*  --  0.17*  --   < > = values in this interval not displayed.  Estimated Creatinine Clearance: 78.6 mL/min (by C-G formula based on Cr of 0.8).  Assessment: 59yoF on heparin for CP. Trop elevated to 0.41 - now trending down. Heparin level 0.33 (therapeutic). No bleeding noted. CBC stable.  Goal of Therapy:  Heparin level 0.3-0.7 units/ml Monitor platelets by anticoagulation protocol: Yes   Plan:  - Continue heparin gtt at 1100 units/hr - F/u daily heparin level and CBC  Christoper Fabianaron Lennie Dunnigan, PharmD, BCPS Clinical pharmacist, pager [phone]  10/22/2014 10:44 AM

## 2014-10-22 NOTE — Progress Notes (Signed)
[facility] TEAM 1 - Stepdown/ICU TEAM PROGRESS NOTE  [name] L Cannaday NWG:[id number] DOB: 18-Nov-1955 DOA: 10/20/2014 PCP: Lupe Carneyean Mitchell, MD  Admit HPI / Brief Narrative: 59 y.o. female with a history of diabetes and hypertension who presented to the ED with shortness of breath . Patient saw PCP on a Friday for change in urine color, she apparently had a low-grade temp at the time. She was started on Cipro for presumed UTI . After a dose of Cipro patient developed shortness of breath and left-sided chest discomfort. She stopped Cipro after 3rd dose on because of persistent SOB and chest discomfort. After discontinuation of the anti-biotic her chest discomfort subsided but the shortness of breath persisted. In the emergency department patient was tachycardic with heart rate of 117, respirations rapid in the thirties. Afebrile. Oxygen saturation 95% on room air. ABG - pH 7.3, PCO2 low at 30.4, PO2 99, bicarbonate 16. Troponin elevated at 0.63, EKG revealed sinus tachycardia at 150 bpm, anterior infarct, age undetermined. Hemoglobin normal at13, MCV 76. D-dimer 5.43. CTA not done given renal failurebut Ventilation/perfusion scan negative for pulmonary embolism (very low probability). Chest x-ray revealed mild cardiomegaly, no acute findings.   HPI/Subjective: The patient states that overall she was feeling much better yesterday afternoon but over the course of the night/this morning she has become progressively short of breath.  She denies current chest pain nausea vomiting abdominal pain diarrhea or constipation.  Assessment/Plan:  Dyspnea - tachypnea  -Troponin peaked at 0.6 - no acute changes on EKG - chest pain resolved two days prior to admit   -D-dimer > 5 - V/Q scan negative / low probability- stop IV heparin  -CXR negative admit but now having crackles on exam - suspect pulmonary edema due to volume resuscitation - check CXR - diurese gently - stop IVF   Sinus tachycardia  -no evidence of  PE on VQ scan -suspect this is due to pulmonary edema - diurese - follow   Metabolic acidosis with respiratory compensation  -Resolved  Diabetes mellitus type 2, uncontrolled, with complications  -no evidence to suggest true DKA at present - ?early DKA at presentation - CBG now well controlled  -5/13 A1c 7.6  Acute kidney injury Present at admission - resolved [facility]   Elevated lipase - ?Pancreatitis  -?etiology - no evidence of stones on US abdom - recheck lipase in AM - no exam findings to suggest actual pancreatitis   Hypokalemia -corrected to normal level  Leukocytosis  -?occult PNA not evident on admit CXR due to dehydration - f/u CXR today - hold abx unless febrile or clear infiltrate on CXR   Diastolic CHF -mild, see echocardiogram results below  Elevated Troponin -trop peaked at 0.63 - elevation probably secondary to demand ischemia -no chest pain in 2+ days  -Echocardiogram w/o evidence of WMA   Elevated liver enzymes - Diffuse hepatic steatosis per US -LFTs climbing, but Tbil improved - follow trend   Microcytic anemia -no clear blood loss - check Fe studies - check stool  Obesity - Body mass index is 34.29 kg/(m^2).  Code Status: FULL Family Communication: Spoke with husband at bedside at length Disposition Plan: SDU until respiratory status more stable  Consultants: none  Procedures: 10/11 TTE - Left ventricle: mild LVH - EF 50% to 55% - grade 1 diastolic dysfunction  Antibiotics: none  DVT prophylaxis: lovenox     Objective: Blood pressure 139/79, pulse 109, temperature 98.3 F (36.8 C), temperature  source Oral, resp. rate 27, height  (1.6 m), weight 87.771 kg (193 lb 8 oz), SpO2 99 %.  Intake/Output Summary (Last 24 hours) at 10/22/14 1028 Last data filed at 10/22/14 0924  Gross per 24 hour  Intake 3633.56 ml  Output   2900 ml  Net 733.56 ml   Exam: General: mild tachypnea at rest in bedside chair    Lungs:  Diffuse mild crackles bilaterally midway up with no wheeze Cardiovascular: tachycardic but regular without appreciable gallop or rub  - no murmur  Abdomen: Nontender, obese, soft, bowel sounds positive, no rebound, no ascites, no appreciable mass Extremities: No significant cyanosis, clubbing;  trace edema bilateral lower extremities  Data Reviewed: Basic Metabolic Panel:  Recent Labs Lab 10/20/14 1716  10/21/14 1353 10/21/14 1550 10/21/14 1915 10/21/14 2300 10/22/14 0524  NA 137  < > 136 138 138 137 139  K 3.2*  < > 3.7 3.6 3.3* 3.0* 3.7  CL 97*  < > 105 102 102 99* 100*  CO2 19*  < > 20* GLUCOSE 132*  < > 145* 147* 132* 127* 149*  BUN 22*  < > CREATININE 1.47*  < > 0.88 0.91 0.80 0.82 0.80  CALCIUM 7.7*  < > 7.8* 8.0* 8.1* 7.9* 8.1*  MG 2.7*  --   --   --   --   --   --   PHOS 3.8  --   --   --   --   --   --   < > = values in this interval not displayed.  CBC:  Recent Labs Lab 10/20/14 0853 10/21/14 0417 10/22/14 0524  WBC 18.7* 13.9* 14.3*  NEUTROABS 14.4*  --   --   HGB 13.0 11.5* 11.6*  HCT 42.1 36.0 35.7*  MCV 76.7* 75.9* 75.8*  PLT 214 185 211    Liver Function Tests:  Recent Labs Lab 10/20/14 0853 10/21/14 0417  AST 135* 179*  ALT 217* 222*  ALKPHOS 462* 410*  BILITOT 3.9* 2.2*  PROT 7.2 6.3*  ALBUMIN 2.9* 2.4*    Recent Labs Lab 10/20/14 1426  LIPASE 118*   Cardiac Enzymes:  Recent Labs Lab 10/20/14 1716 10/20/14 2241 10/21/14 1353 10/21/14 1550 10/21/14 2300  CKTOTAL 342*  --   --   --   --   TROPONINI 0.41* 0.31* 0.29* 0.20* 0.17*    CBG:  Recent Labs Lab 10/21/14 1800 10/21/14 1854 10/21/14 2011 10/21/14 2329 10/22/14 0750  GLUCAP 133* 130* 127* 136* 117*    Recent Results (from the past 240 hour(s))  Urine culture     Status: None   Collection Time: 10/20/14 10:10 AM  Result Value Ref Range Status   Specimen Description URINE, RANDOM  Final   Special Requests NONE  Final    Culture NO GROWTH 1 DAY  Final   Report Status 10/21/2014 FINAL  Final  Blood culture (routine x 2)     Status: None (Preliminary result)   Collection Time: 10/20/14 10:45 AM  Result Value Ref Range Status   Specimen Description BLOOD LEFT FOREARM  Final   Special Requests BOTTLES DRAWN AEROBIC AND ANAEROBIC  Final   Culture NO GROWTH 1 DAY  Final   Report Status PENDING  Incomplete  Blood culture (routine x 2)     Status: None (Preliminary result)   Collection Time: 10/20/14 11:10 AM  Result Value Ref Range Status   Specimen  Description BLOOD LEFT ARM  Final   Special Requests BOTTLES DRAWN AEROBIC AND ANAEROBIC  Final   Culture NO GROWTH 1 DAY  Final   Report Status PENDING  Incomplete  MRSA PCR Screening     Status: None   Collection Time: 10/20/14  7:07 PM  Result Value Ref Range Status   MRSA by PCR NEGATIVE NEGATIVE Final    Comment:        The GeneXpert MRSA Assay (FDA approved for NASAL specimens only), is one component of a comprehensive MRSA colonization surveillance program. It is not intended to diagnose MRSA infection nor to guide or monitor treatment for MRSA infections.   MRSA PCR Screening     Status: None   Collection Time: 10/21/14 10:45 PM  Result Value Ref Range Status   MRSA by PCR NEGATIVE NEGATIVE Final    Comment:        The GeneXpert MRSA Assay (FDA approved for NASAL specimens only), is one component of a comprehensive MRSA colonization surveillance program. It is not intended to diagnose MRSA infection nor to guide or monitor treatment for MRSA infections.      Studies:   Recent x-ray studies have been reviewed in detail by the Attending Physician  Scheduled Meds:  Scheduled Meds: . aspirin  81 mg Oral Daily  . docusate sodium  100 mg Oral BID  . insulin aspart  0-9 Units Subcutaneous TID WC  . insulin glargine  10 Units Subcutaneous Daily    Time spent on care of this patient: 35 mins   Nazanin Kinner T , MD    [facility] Office  [phone] Pager - Text Page per Loretha Stapler as per below:  On-Call/Text Page:      [email or web address]      password [email or web address]  If 7PM-7AM, please contact night-coverage [email or web address] Password [email or web address] 10/22/2014, 10:28 AM   LOS: 2 days

## 2014-10-22 NOTE — Progress Notes (Signed)
Inpatient Diabetes Program Recommendations  AACE/ADA: New Consensus Statement on Inpatient Glycemic Control (2015)  Target Ranges:  Prepandial:   less than 140 mg/dL      Peak postprandial:   less than 180 mg/dL (1-2 hours)      Critically ill patients:  140 - 180 mg/dL   Results for Rudi RummageCATHEY, Earlyne L (MRN [id number]) as of 10/22/2014 11:17  Ref. Range 10/22/2014 07:50  Glucose-Capillary Latest Ref Range: 65-99 mg/dL 409117 (H)    Home DM Meds: Invokana 100 mg daily  Metformin 1000 mg bid  Januvia 100 mg daily  Current Insulin Orders: Novolog Sensitive SSI (0-9 units) TID AC   "59 y.o. female with a history of diabetes and hypertension in the ED with shortness of breath . Patient saw PCP Friday for change in urine color, she apparently had a low-grade temp at the time. She was started on Cipro for presumed UTI . After a dose of Cipro on Friday patient developed shortness of breath and left-sided chest discomfort. She stopped Cipro after 3rd dose on Saturday because of persistent SOB and chest discomfort."    -Concern that patient may have been at high risk for DKA secondary to the Invokana she is taking at home.   -Would Not restart her Invokana in the hospital once patient is transitioned off the IV insulin drip.   -Spoke with patient today about the course of events that led to her hospitalization.  Patient and her family member told me that patient was being treated for a UTI by her PCP.  Became progressively sicker and weaker over the weekend.  Was still taking all three of her oral DM medicines (including the Invokana) and trying to maintain hydration.  Discussed with patient that her oral DM medicine Invokana can place patients at higher risk for DKA when they are not able to take adequate hydration.  Explained all the treatments we have given her (IVF hydration, IV insulin drip, SQ insulin, frequent labs) and the reasons behind all  these treatments.  Encouraged patient to follow up with her PCP right after d/c from the hospital to discuss what to do with her Invokana (especially on sick days).    -Alerted Dr. Sharon SellerMcClung to this information as well.    ----Will follow patient during hospitalization----  Ambrose FinlandJeannine Johnston Jacquan Savas RN, MSN, CDE Diabetes Coordinator Inpatient Glycemic Control Team Team Pager: [phone] (8a-5p)

## 2014-10-23 DIAGNOSIS — J810 Acute pulmonary edema: Secondary | ICD-10-CM

## 2014-10-23 DIAGNOSIS — D72829 Elevated white blood cell count, unspecified: Secondary | ICD-10-CM | POA: Diagnosis present

## 2014-10-23 DIAGNOSIS — J811 Chronic pulmonary edema: Secondary | ICD-10-CM | POA: Diagnosis present

## 2014-10-23 LAB — IRON AND TIBC
Iron: 64 ug/dL (ref 28–170)
Saturation Ratios: 25 % (ref 10.4–31.8)
TIBC: 260 ug/dL (ref 250–450)
UIBC: 196 ug/dL

## 2014-10-23 LAB — COMPREHENSIVE METABOLIC PANEL
ALK PHOS: 450 U/L — AB (ref 38–126)
ALT: 27 U/L (ref 14–54)
AST: 75 U/L — ABNORMAL HIGH (ref 15–41)
Albumin: 2.3 g/dL — ABNORMAL LOW (ref 3.5–5.0)
Anion gap: 17 — ABNORMAL HIGH (ref 5–15)
BUN: 7 mg/dL (ref 6–20)
CALCIUM: 8.7 mg/dL — AB (ref 8.9–10.3)
CO2: 29 mmol/L (ref 22–32)
CREATININE: 0.88 mg/dL (ref 0.44–1.00)
Chloride: 97 mmol/L — ABNORMAL LOW (ref 101–111)
Glucose, Bld: 137 mg/dL — ABNORMAL HIGH (ref 65–99)
Potassium: 2.8 mmol/L — ABNORMAL LOW (ref 3.5–5.1)
Sodium: 143 mmol/L (ref 135–145)
TOTAL PROTEIN: 6.9 g/dL (ref 6.5–8.1)
Total Bilirubin: 1.8 mg/dL — ABNORMAL HIGH (ref 0.3–1.2)

## 2014-10-23 LAB — CBC
HEMATOCRIT: 37.4 % (ref 36.0–46.0)
Hemoglobin: 11.9 g/dL — ABNORMAL LOW (ref 12.0–15.0)
MCH: 24.2 pg — ABNORMAL LOW (ref 26.0–34.0)
MCHC: 31.8 g/dL (ref 30.0–36.0)
MCV: 76 fL — ABNORMAL LOW (ref 78.0–100.0)
Platelets: 230 10*3/uL (ref 150–400)
RBC: 4.92 MIL/uL (ref 3.87–5.11)
RDW: 14.9 % (ref 11.5–15.5)
WBC: 14.8 10*3/uL — AB (ref 4.0–10.5)

## 2014-10-23 LAB — GLUCOSE, CAPILLARY
GLUCOSE-CAPILLARY: 169 mg/dL — AB (ref 65–99)
GLUCOSE-CAPILLARY: 198 mg/dL — AB (ref 65–99)
Glucose-Capillary: 140 mg/dL — ABNORMAL HIGH (ref 65–99)
Glucose-Capillary: 230 mg/dL — ABNORMAL HIGH (ref 65–99)

## 2014-10-23 LAB — RETICULOCYTES
RBC.: 4.92 MIL/uL (ref 3.87–5.11)
RETIC COUNT ABSOLUTE: 73.8 10*3/uL (ref 19.0–186.0)
RETIC CT PCT: 1.5 % (ref 0.4–3.1)

## 2014-10-23 LAB — VITAMIN B12: VITAMIN B 12: 1270 pg/mL — AB (ref 180–914)

## 2014-10-23 LAB — LIPASE, BLOOD: LIPASE: 86 U/L — AB (ref 22–51)

## 2014-10-23 LAB — FOLATE: Folate: 17.7 ng/mL (ref >5.9)

## 2014-10-23 LAB — FERRITIN: Ferritin: 893 ng/mL — ABNORMAL HIGH (ref 11–307)

## 2014-10-23 MED ORDER — POTASSIUM CHLORIDE CRYS ER 20 MEQ PO TBCR
50.0000 meq | EXTENDED_RELEASE_TABLET | Freq: Once | ORAL | Status: AC
  Administered 2014-10-23: 50 meq via ORAL
  Filled 2014-10-23: qty 1

## 2014-10-23 NOTE — Progress Notes (Signed)
[facility] TEAM 1 - Stepdown/ICU TEAM Progress Note  Brittany [name]:[id number] DOB: 07/13/1955 DOA: 10/20/2014 PCP: Lupe Carney, MD  Admit HPI / Brief Narrative: Brittany [name] is a 59 y.o. female with a history of diabetes and hypertension in the ED with shortness of breath . Patient saw PCP Friday for change in urine color, she apparently had a low-grade temp at the time. She was started on Cipro for presumed UTI . After a dose of Cipro on Friday patient developed shortness of breath and left-sided chest discomfort. She stopped Cipro after 3rd dose on Saturday because of persistent SOB and chest discomfort. After discontinuation of the anti-biotic her chest discomfort subsided but the shortness of breath persisted. No cough , she has had chills and low-grade temp since Friday . No other complaints. In the emergency department patient has multiple lab abnormalities.  Patient tachycardic with heart rate of 117, respirations rapid in the thirties. Afebrile. Oxygen saturation 95% on room air. ABGs - pH 7.3, PCO2 low at 30.4, PO2 99, bicarbonate 16. Troponin elevated at 0.63, EKG reveals sinus tachycardia at 150 bpm, anterior infarct, age undetermined. Hemoglobin normal at13, MCV 76. D-dimer 5.43. CTA not done given renal failure But Ventilation/perfusion scan negative for pulmonary embolism (very low probability). Chest x-ray reveals mild cardiomegaly, no acute findings.   Potassium 3.1, BUN 25, creatinine 1.87, alkaline phosphatase 462, albumin 2.9, AST 135, ALT 217, total bilirubin 3.9. Glucose 201. Anion gap 29.   UA positive for ketones, only a few bacteria and negative nitrites. Large glucose  Abdominal ultrasound is pending.  HPI/Subjective: 10/13  A/O 4, negative CP, negative continued SOB. Patient states does not use home O2. States has not ambulated yet   Assessment/Plan: Chest pain / dyspnea.  -Troponin 0.6 but no acute changes on EKG. Chest pain actually resolved two days  ago.  -D-dimer > 5. Ventilation Perfusion scan - negative low probability.  -CXR negative. Chest pain probably from metabolic acidosis, cycling troponins. -  Sinus tachycardia  -no evidence of PE on VQ scan -suspect this is due to pulmonary edema - diurese - follow   Metabolic acidosis with respiratory alkalosis, -Resolved  DKA/Diabetes mellitus type 2, uncontrolled, with complications (HCC).  Suspect DKA. Glucose currently 201, ++ glycosuria, large urine ketones.  -5/13 hemoglobin A1c= 7.6 -Trajenta  5 mg daily -Continue Sensitive SSI   Acute kidney injury Present at admission - resolved - appears to have been due to [facility]   Elevated lipase - ?Pancreatitis  -?etiology - no evidence of stones on US abdom - recheck lipase in AM - no exam findings to suggest actual pancreatitis   Hypokalemia -Potassium goal>4 -K-Dur 50 mEq -Check magnesium and potassium a.m.  Leukocytosis.  -Negative findings on CXR, Afebrile, negative bands, negative left shift not convinced this is a true infection, will monitor closely. -Hold antibiotics at this time .   Diastolic CHF -mild, see echocardiogram results below  Elevated Troponin.  -Troponin peak 0.63, 0.6 at 5 hours. Elevation probably secondary to demand ischemia. -Echocardiogram; see results below   Elevated liver enzymes -Possibly secondary to shock liver, enzymes trending down -Acute hepatitis panel pending -Anti-LKM pending    Code Status: FULL Family Communication: Husband present at time of exam Disposition Plan: Resolution DKA    Consultants: NP Willette Cluster  Discussed case over phone with Cardiology    Procedure/Significant Events: 10/11 echocardiogram;- Left ventricle: mild LVH. -LVEF=50% to 55%. - (grade 1 diastolic dysfunction).   Culture 10/10 urine negative  10/10 blood left forearm/arm NGTD 10/10 MRSA by PCR negative  Antibiotics: None  DVT prophylaxis: Lovenox   Devices    LINES / TUBES:       Continuous Infusions: . sodium chloride 10 mL/hr at 10/22/14 1117    Objective: VITAL SIGNS: Temp: 99.1 F (37.3 C) (10/13 1500) Temp Source: Oral (10/13 1500) BP: 136/80 mmHg (10/13 1213) Pulse Rate: 98 (10/13 1213) SPO2; FIO2:   Intake/Output Summary (Last 24 hours) at 10/23/14 1746 Last data filed at 10/23/14 1540  Gross per 24 hour  Intake    680 ml  Output   1100 ml  Net   -420 ml     Exam: General: A/O 4, positive acute respiratory distress Eyes: Negative headache, eye pain, double vision,negative scleral hemorrhage ENT: Negative Runny nose, negative ear pain, negative gingival bleeding, Neck:  Negative scars, masses, torticollis, lymphadenopathy, JVD Lungs: Clear to auscultation bilaterally without wheezes or crackles Cardiovascular: Tachycardic, Regular rhythm without murmur gallop or rub normal S1 and S2 Abdomen:negative abdominal pain, nondistended, positive soft, bowel sounds, no rebound, no ascites, no appreciable mass Extremities: No significant cyanosis, clubbing, or edema bilateral lower extremities Psychiatric:  Negative depression, negative anxiety, negative fatigue, negative mania  Neurologic:  Cranial nerves II through XII intact, tongue/uvula midline, all extremities muscle strength 5/5, sensation intact throughout, negative dysarthria, negative expressive aphasia, negative receptive aphasia.   Data Reviewed: Basic Metabolic Panel:  Recent Labs Lab 10/20/14 1716  10/21/14 1550 10/21/14 1915 10/21/14 2300 10/22/14 0524 10/23/14 0314  NA 137  < > 138 138 137 139 143  K 3.2*  < > 3.6 3.3* 3.0* 3.7 2.8*  CL 97*  < > 102 102 99* 100* 97*  CO2 19*  < > GLUCOSE 132*  < > 147* 132* 127* 149* 137*  BUN 22*  < > CREATININE 1.47*  < > 0.91 0.80 0.82 0.80 0.88  CALCIUM 7.7*  < > 8.0* 8.1* 7.9* 8.1* 8.7*  MG 2.7*  --   --   --   --   --   --   PHOS 3.8  --   --   --   --   --   --   < > = values in this interval  not displayed. Liver Function Tests:  Recent Labs Lab 10/20/14 0853 10/21/14 0417 10/23/14 0314  AST 135* 179* 75*  ALT 217* 222* 27  ALKPHOS 462* 410* 450*  BILITOT 3.9* 2.2* 1.8*  PROT 7.2 6.3* 6.9  ALBUMIN 2.9* 2.4* 2.3*    Recent Labs Lab 10/20/14 1426 10/23/14 0314  LIPASE 118* 86*   No results for input(s): AMMONIA in the last 168 hours. CBC:  Recent Labs Lab 10/20/14 0853 10/21/14 0417 10/22/14 0524 10/23/14 0314  WBC 18.7* 13.9* 14.3* 14.8*  NEUTROABS 14.4*  --   --   --   HGB 13.0 11.5* 11.6* 11.9*  HCT 42.1 36.0 35.7* 37.4  MCV 76.7* 75.9* 75.8* 76.0*  PLT 214 185 211 230   Cardiac Enzymes:  Recent Labs Lab 10/20/14 1716 10/20/14 2241 10/21/14 1353 10/21/14 1550 10/21/14 2300  CKTOTAL 342*  --   --   --   --   TROPONINI 0.41* 0.31* 0.29* 0.20* 0.17*   BNP (last 3 results) No results for input(s): BNP in the last 8760 hours.  ProBNP (last 3 results) No results for input(s): PROBNP in the last 8760 hours.  CBG:  Recent Labs  Lab 10/22/14 1651 10/22/14 2208 10/23/14 0750 10/23/14 1215 10/23/14 1541  GLUCAP 127* 182* 169* 140* 198*    Recent Results (from the past 240 hour(s))  Urine culture     Status: None   Collection Time: 10/20/14 10:10 AM  Result Value Ref Range Status   Specimen Description URINE, RANDOM  Final   Special Requests NONE  Final   Culture NO GROWTH 1 DAY  Final   Report Status 10/21/2014 FINAL  Final  Blood culture (routine x 2)     Status: None (Preliminary result)   Collection Time: 10/20/14 10:45 AM  Result Value Ref Range Status   Specimen Description BLOOD LEFT FOREARM  Final   Special Requests BOTTLES DRAWN AEROBIC AND ANAEROBIC 5ML  Final   Culture NO GROWTH 3 DAYS  Final   Report Status PENDING  Incomplete  Blood culture (routine x 2)     Status: None (Preliminary result)   Collection Time: 10/20/14 11:10 AM  Result Value Ref Range Status   Specimen Description BLOOD LEFT ARM  Final   Special  Requests BOTTLES DRAWN AEROBIC AND ANAEROBIC 5ML  Final   Culture NO GROWTH 3 DAYS  Final   Report Status PENDING  Incomplete  MRSA PCR Screening     Status: None   Collection Time: 10/20/14  7:07 PM  Result Value Ref Range Status   MRSA by PCR NEGATIVE NEGATIVE Final    Comment:        The GeneXpert MRSA Assay (FDA approved for NASAL specimens only), is one component of a comprehensive MRSA colonization surveillance program. It is not intended to diagnose MRSA infection nor to guide or monitor treatment for MRSA infections.   MRSA PCR Screening     Status: None   Collection Time: 10/21/14 10:45 PM  Result Value Ref Range Status   MRSA by PCR NEGATIVE NEGATIVE Final    Comment:        The GeneXpert MRSA Assay (FDA approved for NASAL specimens only), is one component of a comprehensive MRSA colonization surveillance program. It is not intended to diagnose MRSA infection nor to guide or monitor treatment for MRSA infections.      Studies:  Recent x-ray studies have been reviewed in detail by the Attending Physician  Scheduled Meds:  Scheduled Meds: . aspirin  81 mg Oral Daily  . BYDUREON  2 mg Subcutaneous Weekly  . docusate sodium  100 mg Oral BID  . enoxaparin (LOVENOX) injection  40 mg Subcutaneous Q24H  . hydrochlorothiazide  12.5 mg Oral Daily  . insulin aspart  0-9 Units Subcutaneous TID WC  . linagliptin  5 mg Oral Daily  . lisinopril  20 mg Oral Daily  . metoprolol tartrate  12.5 mg Oral BID  . potassium chloride  50 mEq Oral Once    Time spent on care of this patient: 40 mins   Lazar Tierce, Roselind MessierURTIS J , MD  [facility] Office  [phone] Pager - [phone]  On-Call/Text Page:      Loretha [email or web address]      password [email or web address]  If 7PM-7AM, please contact night-coverage [email or web address] Password [email or web address] 10/23/2014, 5:46 PM   LOS: 3 days   Care during the described time interval was provided by me .  I have reviewed this patient's available data, including  medical history, events of note, physical examination, and all test results as part of my evaluation. I have personally reviewed and interpreted all radiology studies.   Carolyne Littlesurtis Lizbet Cirrincione, MD [phone] Pager

## 2014-10-23 NOTE — Progress Notes (Signed)
Patient to transfer to 6N18 report given to receiving nurse all questions answered at this time.  Pt. VSS with no s/s of distress noted.  Patient stable at transfer. 

## 2014-10-23 NOTE — Progress Notes (Signed)
Patient to transfer to 6N attempted to give report but nurse was unavailable at this time.  Will continue to follow up.

## 2014-10-24 ENCOUNTER — Ambulatory Visit: Payer: 59

## 2014-10-24 DIAGNOSIS — R739 Hyperglycemia, unspecified: Secondary | ICD-10-CM

## 2014-10-24 DIAGNOSIS — D72829 Elevated white blood cell count, unspecified: Secondary | ICD-10-CM

## 2014-10-24 LAB — CBC WITH DIFFERENTIAL/PLATELET
BASOS ABS: 0.1 10*3/uL (ref 0.0–0.1)
Basophils Relative: 1 %
EOS ABS: 5.4 10*3/uL — AB (ref 0.0–0.7)
Eosinophils Relative: 37 %
HCT: 37.1 % (ref 36.0–46.0)
HEMOGLOBIN: 11.8 g/dL — AB (ref 12.0–15.0)
Lymphocytes Relative: 16 %
Lymphs Abs: 2.3 10*3/uL (ref 0.7–4.0)
MCH: 24.3 pg — ABNORMAL LOW (ref 26.0–34.0)
MCHC: 31.8 g/dL (ref 30.0–36.0)
MCV: 76.5 fL — ABNORMAL LOW (ref 78.0–100.0)
MONOS PCT: 9 %
Monocytes Absolute: 1.3 10*3/uL — ABNORMAL HIGH (ref 0.1–1.0)
NEUTROS PCT: 37 %
Neutro Abs: 5.4 10*3/uL (ref 1.7–7.7)
PLATELETS: 250 10*3/uL (ref 150–400)
RBC: 4.85 MIL/uL (ref 3.87–5.11)
RDW: 15 % (ref 11.5–15.5)
WBC: 14.5 10*3/uL — ABNORMAL HIGH (ref 4.0–10.5)

## 2014-10-24 LAB — COMPREHENSIVE METABOLIC PANEL
ALBUMIN: 2.4 g/dL — AB (ref 3.5–5.0)
ALK PHOS: 429 U/L — AB (ref 38–126)
ALT: 9 U/L — ABNORMAL LOW (ref 14–54)
ANION GAP: 10 (ref 5–15)
AST: 63 U/L — ABNORMAL HIGH (ref 15–41)
BILIRUBIN TOTAL: 1.3 mg/dL — AB (ref 0.3–1.2)
BUN: 6 mg/dL (ref 6–20)
CALCIUM: 8.7 mg/dL — AB (ref 8.9–10.3)
CO2: 33 mmol/L — AB (ref 22–32)
Chloride: 99 mmol/L — ABNORMAL LOW (ref 101–111)
Creatinine, Ser: 0.64 mg/dL (ref 0.44–1.00)
GFR calc non Af Amer: >60 mL/min (ref >60)
GLUCOSE: 159 mg/dL — AB (ref 65–99)
POTASSIUM: 3 mmol/L — AB (ref 3.5–5.1)
SODIUM: 142 mmol/L (ref 135–145)
TOTAL PROTEIN: 6.6 g/dL (ref 6.5–8.1)

## 2014-10-24 LAB — GLUCOSE, CAPILLARY
GLUCOSE-CAPILLARY: 181 mg/dL — AB (ref 65–99)
Glucose-Capillary: 154 mg/dL — ABNORMAL HIGH (ref 65–99)

## 2014-10-24 LAB — HEPATITIS PANEL, ACUTE
HEP A IGM: NEGATIVE
HEP B S AG: NEGATIVE
Hep B C IgM: NEGATIVE

## 2014-10-24 LAB — LIPASE, BLOOD: LIPASE: 88 U/L — AB (ref 22–51)

## 2014-10-24 LAB — MAGNESIUM: MAGNESIUM: 2.1 mg/dL (ref 1.7–2.4)

## 2014-10-24 LAB — PATHOLOGIST SMEAR REVIEW: PATH REVIEW: ELEVATED

## 2014-10-24 LAB — ANTI-MICROSOMAL ANTIBODY LIVER / KIDNEY: LKM1 Ab: 3.1 Units (ref 0.0–20.0)

## 2014-10-24 MED ORDER — METOPROLOL TARTRATE 25 MG PO TABS: 12.5000 mg | ORAL_TABLET | Freq: Two times a day (BID) | ORAL | Status: DC

## 2014-10-24 MED ORDER — POTASSIUM CHLORIDE CRYS ER 20 MEQ PO TBCR: 40.0000 meq | EXTENDED_RELEASE_TABLET | Freq: Once | ORAL | Status: DC

## 2014-10-24 MED ORDER — POTASSIUM CHLORIDE CRYS ER 20 MEQ PO TBCR: 20.0000 meq | EXTENDED_RELEASE_TABLET | Freq: Every day | ORAL | Status: DC

## 2014-10-24 NOTE — Discharge Summary (Signed)
DISCHARGE SUMMARY  Brittany [name]  MR#: [id number]  DOB:21-Aug-1955  Date of Admission: 10/20/2014 Date of Discharge: 10/24/2014  Attending Physician:MCCLUNG,JEFFREY T  Patient's WUJ:WJXB Clovis Riley, MD  Consults:  None   Disposition: D/C home   Follow-up Appts:     Follow-up Information    Follow up with Lupe Carney, MD In 1 week.   Specialty:  Family Medicine   Contact information:   [address] [place] [address] [phone]      Tests Needing Follow-up: -recheck lipase in 7 days - if remains elevated, CT abdom suggested to further evaluate  -recheck CBC to evaluate for persisting leukocytosis  -check CBG control   Discharge Diagnoses: Dyspnea - tachypnea - acute hypoxic respiratory failure due to pulmonary edema  Sinus tachycardia  Metabolic acidosis with respiratory compensation  Diabetes mellitus type 2, uncontrolled, with complications  Acute kidney injury Elevated lipase - ?Pancreatitis  Hypokalemia Leukocytosis  Diastolic CHF Elevated Troponin Elevated liver enzymes - Diffuse hepatic steatosis per [place]  Microcytic anemia Obesity - Body mass index is 34.29 kg/(m^2)  Initial presentation: 59 y.o. female with a history of diabetes and hypertension who presented to the ED with shortness of breath . Patient saw PCP on a Friday for change in urine color, she apparently had a low-grade temp at the time. She was started on Cipro for presumed UTI . After a dose of Cipro patient developed shortness of breath and left-sided chest discomfort. She stopped Cipro after 3rd dose on because of persistent SOB and chest discomfort. After discontinuation of the anti-biotic her chest discomfort subsided but the shortness of breath persisted. In the emergency department patient was tachycardic with heart rate of 117, respirations rapid in the thirties. Afebrile. Oxygen saturation 95% on room air. ABG - pH 7.3, PCO2 low at 30.4, PO2 99, bicarbonate 16.  Troponin elevated at 0.63, EKG revealed sinus tachycardia at 150 bpm, anterior infarct, age undetermined. Hemoglobin normal at13, MCV 76. D-dimer 5.43. CTA not done given renal failurebut Ventilation/perfusion scan negative for pulmonary embolism (very low probability). Chest x-ray revealed mild cardiomegaly, no acute findings.   Hospital Course:  Dyspnea - tachypnea - acute hypoxic respiratory failure due to pulmonary edema  -Troponin peaked at 0.6 - no acute changes on EKG - chest pain resolved two days prior to admit  -D-dimer > 5 - V/Q scan negative / low probability -CXR negative admit but after volume expansion developed crackles on exam > pulmonary edema due to volume resuscitation - diuresed gently and sx resolved - stable on RA at time of d/c   Sinus tachycardia  -no evidence of PE on VQ scan -suspect this is due to pulmonary edema - resolved w/ diuresis  Metabolic acidosis with respiratory compensation  -Resolved  Diabetes mellitus type 2, uncontrolled, with complications  -no evidence to suggest full blown DKA at presention - ?early DKA  - CBG now reasonably well controlled  -Invokana stopped due to possible link to early DKA  -5/13 A1c 7.6  Acute kidney injury Present at admission - resolved - appears to have been due to [facility]   Elevated lipase - ?Pancreatitis  -?etiology - no evidence of stones on US abdom - recheck lipase remained mildly elevated, but pt entirely asymtomatic - no exam findings to suggest actual pancreatitis   Hypokalemia -corrected w/ supplementation   Leukocytosis  -f/u CXR w/o evidence of infiltrate - afebrile - recheck of CBC suggested in f/u - no localizing sx  Diastolic CHF -mild,  see echocardiogram results below  Elevated Troponin -trop peaked at 0.63 - elevation probably secondary to demand ischemia -no chest pain in 4+ days  -Echocardiogram w/o evidence of WMA   Elevated liver enzymes - Diffuse hepatic steatosis per [place] -LFTs  essentially normalized at time of d/c   Microcytic anemia -no clear blood loss - Fe studies w/o evidence of Fe deficiency   Obesity - Body mass index is 34.29 kg/(m^2).   Procedures: 10/11 TTE - Left ventricle: mild LVH - EF 50% to 55% - grade 1 diastolic dysfunction    Medication List    STOP taking these medications        ciprofloxacin 500 MG tablet  Commonly known as:  CIPRO     INVOKANA 100 MG Tabs tablet  Generic drug:  canagliflozin      TAKE these medications        aspirin 81 MG tablet  Take 81 mg by mouth daily.     BYDUREON 2 MG Susr  Generic drug:  Exenatide ER  Inject 2 mg into the skin once a week. Saturday     diazepam 5 MG tablet  Commonly known as:  VALIUM  Take 1 tablet (5 mg total) by mouth every 8 (eight) hours as needed for anxiety.     lisinopril-hydrochlorothiazide 20-12.5 MG tablet  Commonly known as:  PRINZIDE,ZESTORETIC  Take 1 tablet by mouth daily.     metFORMIN 500 MG tablet  Commonly known as:  GLUCOPHAGE  Take 1,000 mg by mouth 2 (two) times daily with a meal.     metoprolol tartrate 25 MG tablet  Commonly known as:  LOPRESSOR  Take 0.5 tablets (12.5 mg total) by mouth 2 (two) times daily.     ONE-A-DAY WOMENS PO  Take 1 tablet by mouth daily.     potassium chloride SA 20 MEQ tablet  Commonly known as:  K-DUR,KLOR-CON  Take 1 tablet (20 mEq total) by mouth daily.     sitaGLIPtin 100 MG tablet  Commonly known as:  JANUVIA  Take 100 mg by mouth daily.        Day of Discharge BP 129/69 mmHg  Pulse 95  Temp(Src) 98.9 F (37.2 C) (Oral)  Resp 18  Ht  (1.6 m)  Wt 90.2 kg (198 lb 13.7 oz)  BMI 35.23 kg/m2  SpO2 96%  Physical Exam: General: No acute respiratory distress Lungs: Clear to auscultation bilaterally without wheezes or crackles Cardiovascular: Regular rate and rhythm without murmur gallop or rub normal S1 and S2 Abdomen: Nontender, nondistended, soft, bowel sounds positive, no rebound, no ascites, no  appreciable mass Extremities: No significant cyanosis, clubbing, or edema bilateral lower extremities  Basic Metabolic Panel:  Recent Labs Lab 10/20/14 1716  10/21/14 1915 10/21/14 2300 10/22/14 0524 10/23/14 0314 10/24/14 0338  NA 137  < > 138 137 139 143 142  K 3.2*  < > 3.3* 3.0* 3.7 2.8* 3.0*  CL 97*  < > 102 99* 100* 97* 99*  CO2 19*  < > 33*  GLUCOSE 132*  < > 132* 127* 149* 137* 159*  BUN 22*  < > CREATININE 1.47*  < > 0.80 0.82 0.80 0.88 0.64  CALCIUM 7.7*  < > 8.1* 7.9* 8.1* 8.7* 8.7*  MG 2.7*  --   --   --   --   --  2.1  PHOS 3.8  --   --   --   --   --   --   < > =  values in this interval not displayed.  Liver Function Tests:  Recent Labs Lab 10/20/14 0853 10/21/14 0417 10/23/14 0314 10/24/14 0338  AST 135* 179* 75* 63*  ALT 217* 222* 27 9*  ALKPHOS 462* 410* 450* 429*  BILITOT 3.9* 2.2* 1.8* 1.3*  PROT 7.2 6.3* 6.9 6.6  ALBUMIN 2.9* 2.4* 2.3* 2.4*    Recent Labs Lab 10/20/14 1426 10/23/14 0314 10/24/14 0338  LIPASE 118* 86* 88*    CBC:  Recent Labs Lab 10/20/14 0853 10/21/14 0417 10/22/14 0524 10/23/14 0314 10/24/14 0338  WBC 18.7* 13.9* 14.3* 14.8* 14.5*  NEUTROABS 14.4*  --   --   --  5.4  HGB 13.0 11.5* 11.6* 11.9* 11.8*  HCT 42.1 36.0 35.7* 37.4 37.1  MCV 76.7* 75.9* 75.8* 76.0* 76.5*  PLT 214 185 211 230 250    Cardiac Enzymes:  Recent Labs Lab 10/20/14 1716 10/20/14 2241 10/21/14 1353 10/21/14 1550 10/21/14 2300  CKTOTAL 342*  --   --   --   --   TROPONINI 0.41* 0.31* 0.29* 0.20* 0.17*    CBG:  Recent Labs Lab 10/23/14 1215 10/23/14 1541 10/23/14 2145 10/24/14 0814 10/24/14 1151  GLUCAP 140* 198* 230* 181* 154*    Recent Results (from the past 240 hour(s))  Urine culture     Status: None   Collection Time: 10/20/14 10:10 AM  Result Value Ref Range Status   Specimen Description URINE, RANDOM  Final   Special Requests NONE  Final   Culture NO GROWTH 1 DAY  Final   Report Status  10/21/2014 FINAL  Final  Blood culture (routine x 2)     Status: None (Preliminary result)   Collection Time: 10/20/14 10:45 AM  Result Value Ref Range Status   Specimen Description BLOOD LEFT FOREARM  Final   Special Requests BOTTLES DRAWN AEROBIC AND ANAEROBIC 5ML  Final   Culture NO GROWTH 3 DAYS  Final   Report Status PENDING  Incomplete  Blood culture (routine x 2)     Status: None (Preliminary result)   Collection Time: 10/20/14 11:10 AM  Result Value Ref Range Status   Specimen Description BLOOD LEFT ARM  Final   Special Requests BOTTLES DRAWN AEROBIC AND ANAEROBIC 5ML  Final   Culture NO GROWTH 3 DAYS  Final   Report Status PENDING  Incomplete  MRSA PCR Screening     Status: None   Collection Time: 10/20/14  7:07 PM  Result Value Ref Range Status   MRSA by PCR NEGATIVE NEGATIVE Final    Comment:        The GeneXpert MRSA Assay (FDA approved for NASAL specimens only), is one component of a comprehensive MRSA colonization surveillance program. It is not intended to diagnose MRSA infection nor to guide or monitor treatment for MRSA infections.   MRSA PCR Screening     Status: None   Collection Time: 10/21/14 10:45 PM  Result Value Ref Range Status   MRSA by PCR NEGATIVE NEGATIVE Final    Comment:        The GeneXpert MRSA Assay (FDA approved for NASAL specimens only), is one component of a comprehensive MRSA colonization surveillance program. It is not intended to diagnose MRSA infection nor to guide or monitor treatment for MRSA infections.     Time spent in discharge (includes decision making & examination of pt): >35 minutes  10/24/2014, 2:08 PM   Lonia BloodJeffrey T. McClung, MD [facility] Office  [phone] Pager [phone]  On-Call/Text Page:  [email or web address]      password [email or web address]

## 2014-10-24 NOTE — Discharge Instructions (Signed)
Blood Glucose Monitoring, Adult ° °Monitoring your blood glucose (also know as blood sugar) helps you to manage your diabetes. It also helps you and your health care provider monitor your diabetes and determine how well your treatment plan is working. °WHY SHOULD YOU MONITOR YOUR BLOOD GLUCOSE? °· It can help you understand how food, exercise, and medicine affect your blood glucose. °· It allows you to know what your blood glucose is at any given moment. You can quickly tell if you are having low blood glucose (hypoglycemia) or high blood glucose (hyperglycemia). °· It can help you and your health care provider know how to adjust your medicines. °· It can help you understand how to manage an illness or adjust medicine for exercise. °WHEN SHOULD YOU TEST? °Your health care provider will help you decide how often you should check your blood glucose. This may depend on the type of diabetes you have, your diabetes control, or the types of medicines you are taking. Be sure to write down all of your blood glucose readings so that this information can be reviewed with your health care provider. See below for examples of testing times that your health care provider may suggest. °Type 1 Diabetes °· Test at least 2 times per day if your diabetes is well controlled, if you are using an insulin pump, or if you perform multiple daily injections. °· If your diabetes is not well controlled or if you are sick, you may need to test more often. °· It is a good idea to also test: °¨ Before every insulin injection. °¨ Before and after exercise. °¨ Between meals and 2 hours after a meal. °¨ Occasionally between 2:00 a.m. and 3:00 a.m. °Type 2 Diabetes °· If you are taking insulin, test at least 2 times per day. However, it is best to test before every insulin injection. °· If you take medicines by mouth (orally), test 2 times a day. °· If you are on a controlled diet, test once a day. °· If your diabetes is not well controlled or if you  are sick, you may need to monitor more often. °HOW TO MONITOR YOUR BLOOD GLUCOSE °Supplies Needed °· Blood glucose meter. °· Test strips for your meter. Each meter has its own strips. You must use the strips that go with your own meter. °· A pricking needle (lancet). °· A device that holds the lancet (lancing device). °· A journal or log book to write down your results. °Procedure °· Wash your hands with soap and water. Alcohol is not preferred. °· Prick the side of your finger (not the tip) with the lancet. °· Gently milk the finger until a small drop of blood appears. °· Follow the instructions that come with your meter for inserting the test strip, applying blood to the strip, and using your blood glucose meter. °Other Areas to Get Blood for Testing °Some meters allow you to use other areas of your body (other than your finger) to test your blood. These areas are called alternative sites. The most common alternative sites are: °· The forearm. °· The thigh. °· The back area of the lower leg. °· The palm of the hand. °The blood flow in these areas is slower. Therefore, the blood glucose values you get may be delayed, and the numbers are different from what you would get from your fingers. Do not use alternative sites if you think you are having hypoglycemia. Your reading will not be accurate. Always use a finger if you   are having hypoglycemia. Also, if you cannot feel your lows (hypoglycemia unawareness), always use your fingers for your blood glucose checks. °ADDITIONAL TIPS FOR GLUCOSE MONITORING °· Do not reuse lancets. °· Always carry your supplies with you. °· All blood glucose meters have a 24-hour "hotline" number to call if you have questions or need help. °· Adjust (calibrate) your blood glucose meter with a control solution after finishing a few boxes of strips. °BLOOD GLUCOSE RECORD KEEPING °It is a good idea to keep a daily record or log of your blood glucose readings. Most glucose meters, if not all,  keep your glucose records stored in the meter. Some meters come with the ability to download your records to your home computer. Keeping a record of your blood glucose readings is especially helpful if you are wanting to look for patterns. Make notes to go along with the blood glucose readings because you might forget what happened at that exact time. Keeping good records helps you and your health care provider to work together to achieve good diabetes management.  °  °This information is not intended to replace advice given to you by your health care provider. Make sure you discuss any questions you have with your health care provider. °  °Document Released: 12/30/2002 Document Revised: 01/17/2014 Document Reviewed: 05/21/2012 °Elsevier Interactive Patient Education ©2016 [employer] ° °

## 2014-10-25 ENCOUNTER — Emergency Department (HOSPITAL_COMMUNITY): Payer: 59

## 2014-10-25 ENCOUNTER — Inpatient Hospital Stay (HOSPITAL_COMMUNITY)
Admission: EM | Admit: 2014-10-25 | Discharge: 2014-10-27 | DRG: 194 | Disposition: A | Discharge status: Home / Self Care | Payer: 59 | Attending: Internal Medicine | Admitting: Internal Medicine

## 2014-10-25 ENCOUNTER — Encounter (HOSPITAL_COMMUNITY): Payer: Self-pay | Admitting: Vascular Surgery

## 2014-10-25 DIAGNOSIS — Z8249 Family history of ischemic heart disease and other diseases of the circulatory system: Secondary | ICD-10-CM | POA: Diagnosis not present

## 2014-10-25 DIAGNOSIS — Z87891 Personal history of nicotine dependence: Secondary | ICD-10-CM

## 2014-10-25 DIAGNOSIS — R0682 Tachypnea, not elsewhere classified: Secondary | ICD-10-CM | POA: Diagnosis not present

## 2014-10-25 DIAGNOSIS — Z7982 Long term (current) use of aspirin: Secondary | ICD-10-CM

## 2014-10-25 DIAGNOSIS — Z833 Family history of diabetes mellitus: Secondary | ICD-10-CM

## 2014-10-25 DIAGNOSIS — R740 Nonspecific elevation of levels of transaminase and lactic acid dehydrogenase [LDH]: Secondary | ICD-10-CM | POA: Diagnosis present

## 2014-10-25 DIAGNOSIS — Z79899 Other long term (current) drug therapy: Secondary | ICD-10-CM

## 2014-10-25 DIAGNOSIS — E118 Type 2 diabetes mellitus with unspecified complications: Secondary | ICD-10-CM | POA: Diagnosis not present

## 2014-10-25 DIAGNOSIS — R509 Fever, unspecified: Secondary | ICD-10-CM | POA: Insufficient documentation

## 2014-10-25 DIAGNOSIS — K760 Fatty (change of) liver, not elsewhere classified: Secondary | ICD-10-CM | POA: Diagnosis present

## 2014-10-25 DIAGNOSIS — E785 Hyperlipidemia, unspecified: Secondary | ICD-10-CM | POA: Diagnosis present

## 2014-10-25 DIAGNOSIS — E1165 Type 2 diabetes mellitus with hyperglycemia: Secondary | ICD-10-CM | POA: Diagnosis present

## 2014-10-25 DIAGNOSIS — A419 Sepsis, unspecified organism: Secondary | ICD-10-CM | POA: Diagnosis not present

## 2014-10-25 DIAGNOSIS — I5032 Chronic diastolic (congestive) heart failure: Secondary | ICD-10-CM | POA: Diagnosis not present

## 2014-10-25 DIAGNOSIS — K759 Inflammatory liver disease, unspecified: Secondary | ICD-10-CM

## 2014-10-25 DIAGNOSIS — Z825 Family history of asthma and other chronic lower respiratory diseases: Secondary | ICD-10-CM

## 2014-10-25 DIAGNOSIS — R911 Solitary pulmonary nodule: Secondary | ICD-10-CM | POA: Diagnosis present

## 2014-10-25 DIAGNOSIS — I110 Hypertensive heart disease with heart failure: Secondary | ICD-10-CM | POA: Diagnosis present

## 2014-10-25 DIAGNOSIS — J189 Pneumonia, unspecified organism: Principal | ICD-10-CM | POA: Diagnosis present

## 2014-10-25 DIAGNOSIS — R6510 Systemic inflammatory response syndrome (SIRS) of non-infectious origin without acute organ dysfunction: Secondary | ICD-10-CM | POA: Diagnosis not present

## 2014-10-25 DIAGNOSIS — E876 Hypokalemia: Secondary | ICD-10-CM | POA: Diagnosis present

## 2014-10-25 DIAGNOSIS — R7401 Elevation of levels of liver transaminase levels: Secondary | ICD-10-CM | POA: Diagnosis present

## 2014-10-25 DIAGNOSIS — Z7984 Long term (current) use of oral hypoglycemic drugs: Secondary | ICD-10-CM

## 2014-10-25 DIAGNOSIS — IMO0002 Reserved for concepts with insufficient information to code with codable children: Secondary | ICD-10-CM | POA: Diagnosis present

## 2014-10-25 DIAGNOSIS — R7989 Other specified abnormal findings of blood chemistry: Secondary | ICD-10-CM | POA: Diagnosis present

## 2014-10-25 DIAGNOSIS — D72829 Elevated white blood cell count, unspecified: Secondary | ICD-10-CM | POA: Diagnosis present

## 2014-10-25 LAB — CBC WITH DIFFERENTIAL/PLATELET
BASOS PCT: 1 %
Basophils Absolute: 0.1 10*3/uL (ref 0.0–0.1)
EOS PCT: 31 %
Eosinophils Absolute: 4.1 10*3/uL — ABNORMAL HIGH (ref 0.0–0.7)
HEMATOCRIT: 38.8 % (ref 36.0–46.0)
Hemoglobin: 12.6 g/dL (ref 12.0–15.0)
LYMPHS ABS: 1.3 10*3/uL (ref 0.7–4.0)
Lymphocytes Relative: 10 %
MCH: 24.6 pg — AB (ref 26.0–34.0)
MCHC: 32.5 g/dL (ref 30.0–36.0)
MCV: 75.8 fL — AB (ref 78.0–100.0)
MONO ABS: 0.7 10*3/uL (ref 0.1–1.0)
MONOS PCT: 5 %
NEUTROS ABS: 7 10*3/uL (ref 1.7–7.7)
Neutrophils Relative %: 53 %
Platelets: 232 10*3/uL (ref 150–400)
RBC: 5.12 MIL/uL — ABNORMAL HIGH (ref 3.87–5.11)
RDW: 15.1 % (ref 11.5–15.5)
WBC: 13.2 10*3/uL — ABNORMAL HIGH (ref 4.0–10.5)

## 2014-10-25 LAB — URINE MICROSCOPIC-ADD ON

## 2014-10-25 LAB — CULTURE, BLOOD (ROUTINE X 2)
Culture: NO GROWTH
Culture: NO GROWTH

## 2014-10-25 LAB — URINALYSIS, ROUTINE W REFLEX MICROSCOPIC
BILIRUBIN URINE: NEGATIVE
KETONES UR: 40 mg/dL — AB
NITRITE: NEGATIVE
PH: 6 (ref 5.0–8.0)
Protein, ur: NEGATIVE mg/dL
SPECIFIC GRAVITY, URINE: 1.025 (ref 1.005–1.030)
Urobilinogen, UA: 0.2 mg/dL (ref 0.0–1.0)

## 2014-10-25 LAB — COMPREHENSIVE METABOLIC PANEL
ALBUMIN: 2.9 g/dL — AB (ref 3.5–5.0)
ALT: 20 U/L (ref 14–54)
AST: 136 U/L — AB (ref 15–41)
Alkaline Phosphatase: 496 U/L — ABNORMAL HIGH (ref 38–126)
Anion gap: 14 (ref 5–15)
BUN: 5 mg/dL — AB (ref 6–20)
CHLORIDE: 93 mmol/L — AB (ref 101–111)
CO2: 29 mmol/L (ref 22–32)
CREATININE: 0.83 mg/dL (ref 0.44–1.00)
Calcium: 9.2 mg/dL (ref 8.9–10.3)
GFR calc Af Amer: >60 mL/min (ref >60)
GFR calc non Af Amer: >60 mL/min (ref >60)
GLUCOSE: 157 mg/dL — AB (ref 65–99)
POTASSIUM: 3.5 mmol/L (ref 3.5–5.1)
Sodium: 136 mmol/L (ref 135–145)
Total Bilirubin: 1.8 mg/dL — ABNORMAL HIGH (ref 0.3–1.2)
Total Protein: 7.3 g/dL (ref 6.5–8.1)

## 2014-10-25 LAB — CBG MONITORING, ED: GLUCOSE-CAPILLARY: 147 mg/dL — AB (ref 65–99)

## 2014-10-25 LAB — I-STAT CG4 LACTIC ACID, ED
Lactic Acid, Venous: 0.68 mmol/L (ref 0.5–2.0)
Lactic Acid, Venous: 0.85 mmol/L (ref 0.5–2.0)
Lactic Acid, Venous: 1.4 mmol/L (ref 0.5–2.0)

## 2014-10-25 MED ORDER — VANCOMYCIN HCL IN DEXTROSE 1-5 GM/200ML-% IV SOLN
1000.0000 mg | Freq: Two times a day (BID) | INTRAVENOUS | Status: DC
  Administered 2014-10-26 – 2014-10-27 (×3): 1000 mg via INTRAVENOUS
  Filled 2014-10-25 (×4): qty 200

## 2014-10-25 MED ORDER — PIPERACILLIN-TAZOBACTAM 3.375 G IVPB
3.3750 g | Freq: Three times a day (TID) | INTRAVENOUS | Status: DC
  Administered 2014-10-25 – 2014-10-27 (×5): 3.375 g via INTRAVENOUS
  Filled 2014-10-25 (×8): qty 50

## 2014-10-25 MED ORDER — IOHEXOL 350 MG/ML SOLN
100.0000 mL | Freq: Once | INTRAVENOUS | Status: AC | PRN
  Administered 2014-10-25: 100 mL via INTRAVENOUS

## 2014-10-25 MED ORDER — SODIUM CHLORIDE 0.9 % IV BOLUS (SEPSIS)
1000.0000 mL | INTRAVENOUS | Status: AC
  Administered 2014-10-25 (×3): 1000 mL via INTRAVENOUS

## 2014-10-25 MED ORDER — ACETAMINOPHEN 325 MG PO TABS
650.0000 mg | ORAL_TABLET | Freq: Once | ORAL | Status: AC
  Administered 2014-10-25: 650 mg via ORAL

## 2014-10-25 MED ORDER — VANCOMYCIN HCL IN DEXTROSE 1-5 GM/200ML-% IV SOLN
1000.0000 mg | Freq: Once | INTRAVENOUS | Status: AC
  Administered 2014-10-25: 1000 mg via INTRAVENOUS
  Filled 2014-10-25: qty 200

## 2014-10-25 MED ORDER — PIPERACILLIN-TAZOBACTAM 3.375 G IVPB 30 MIN: 3.3750 g | Freq: Three times a day (TID) | INTRAVENOUS | Status: DC

## 2014-10-25 MED ORDER — ACETAMINOPHEN 325 MG PO TABS
ORAL_TABLET | ORAL | Status: AC
  Filled 2014-10-25: qty 1

## 2014-10-25 MED ORDER — SODIUM CHLORIDE 0.9 % IV SOLN
1000.0000 mL | INTRAVENOUS | Status: DC
  Administered 2014-10-25: 1000 mL via INTRAVENOUS

## 2014-10-25 NOTE — ED Notes (Signed)
Pt reports to the ED for eval of fever. Pt was d/c yesterday at approx 1500 following an admission for DKA. Pt reports that today she satred running fevers. At 11:20 it was 101.8, 13:00 it was 101.2, and at 1800 it was 103.4. Pt has a dry cough and was told she had fluid on her lungs. Denies any dysuria, or N/V/D. Pt A&Ox4, resp e/u, and skin warm and dry.

## 2014-10-25 NOTE — Progress Notes (Signed)
ANTIBIOTIC CONSULT NOTE - INITIAL  Pharmacy Consult for Vancomycin Indication: pneumonia  Allergies  Allergen Reactions  . Actos [Pioglitazone] Other (See Comments)    Headache and myalgia  . Statins Nausea Only    Patient Measurements: Weight: 188 lb 11.2 oz (85.594 kg)  Vital Signs: Temp: 102.4 F (39.1 C) (10/15 1951) Temp Source: Oral (10/15 1951) BP: 143/82 mmHg (10/15 2015) Pulse Rate: 116 (10/15 2015) Intake/Output from previous day:   Intake/Output from this shift:    Labs:  Recent Labs  10/23/14 0314 10/24/14 0338 10/25/14 2006  WBC 14.8* 14.5* 13.2*  HGB 11.9* 11.8* 12.6  PLT 230 250 232  CREATININE 0.88 0.64 0.83   Estimated Creatinine Clearance: 74.8 mL/min (by C-G formula based on Cr of 0.83). No results for input(s): VANCOTROUGH, VANCOPEAK, VANCORANDOM, GENTTROUGH, GENTPEAK, GENTRANDOM, TOBRATROUGH, TOBRAPEAK, TOBRARND, AMIKACINPEAK, AMIKACINTROU, AMIKACIN in the last 72 hours.   Microbiology: Recent Results (from the past 720 hour(s))  Urine culture     Status: None   Collection Time: 10/20/14 10:10 AM  Result Value Ref Range Status   Specimen Description URINE, RANDOM  Final   Special Requests NONE  Final   Culture NO GROWTH 1 DAY  Final   Report Status 10/21/2014 FINAL  Final  Blood culture (routine x 2)     Status: None   Collection Time: 10/20/14 10:45 AM  Result Value Ref Range Status   Specimen Description BLOOD LEFT FOREARM  Final   Special Requests BOTTLES DRAWN AEROBIC AND ANAEROBIC 5ML  Final   Culture NO GROWTH 5 DAYS  Final   Report Status 10/25/2014 FINAL  Final  Blood culture (routine x 2)     Status: None   Collection Time: 10/20/14 11:10 AM  Result Value Ref Range Status   Specimen Description BLOOD LEFT ARM  Final   Special Requests BOTTLES DRAWN AEROBIC AND ANAEROBIC 5ML  Final   Culture NO GROWTH 5 DAYS  Final   Report Status 10/25/2014 FINAL  Final  MRSA PCR Screening     Status: None   Collection Time: 10/20/14  7:07  PM  Result Value Ref Range Status   MRSA by PCR NEGATIVE NEGATIVE Final    Comment:        The GeneXpert MRSA Assay (FDA approved for NASAL specimens only), is one component of a comprehensive MRSA colonization surveillance program. It is not intended to diagnose MRSA infection nor to guide or monitor treatment for MRSA infections.   MRSA PCR Screening     Status: None   Collection Time: 10/21/14 10:45 PM  Result Value Ref Range Status   MRSA by PCR NEGATIVE NEGATIVE Final    Comment:        The GeneXpert MRSA Assay (FDA approved for NASAL specimens only), is one component of a comprehensive MRSA colonization surveillance program. It is not intended to diagnose MRSA infection nor to guide or monitor treatment for MRSA infections.     Medical History: Past Medical History  Diagnosis Date  . Diabetes mellitus without complication (HCC)   . Hyperlipidemia   . Hypertension     Assessment: 59 year old female just discharged 10/15 following admission for DKA.  Now with fevers, dry cough, and fluid on her lungs.  Pharmacy asked to dose Vancomycin for possible pneumonia.  Also on Zosyn  Goal of Therapy:  Vancomycin trough level 15-20 mcg/ml  Plan:  Vancomycin 1 gram iv Q 12 hours Zosyn 3.375 grams iv Q 8 hours - 4 hr  infusion Follow up progress, Scr, cultures.  Thank you Okey Regal, PharmD [phone]  10/25/2014,8:57 PM

## 2014-10-25 NOTE — ED Provider Notes (Signed)
CSN: [id number]     Arrival date & time 10/25/14  1944 History   First MD Initiated Contact with Patient 10/25/14 2004     Chief Complaint  Patient presents with  . Fever     (Consider location/radiation/quality/duration/timing/severity/associated sxs/prior Treatment) HPI Patient was discharged from the hospital yesterday. Today she has developed a fever up to 103 at home. Her symptoms continue to be nonlocalizing. She has had some shortness of breath, fatigue and loss of appetite. She continues to deny localizing pain. She endorses some minimal amount of cough that just started. She is denying localizing abdominal pain. No pain burning or urgency with urination. She denies any prior rashes, joint swellings or insect bites. Her first symptoms approximately one week ago were mild dysuria, low-grade fever and fatigue for which she saw her family doctor. She was started on ciprofloxacin without improvement and subsequently developed chest pain and shortness of breath which she attributed to the ciprofloxacin. She was seen in the emergency department and admitted to the hospital. She was discharged yesterday. Past Medical History  Diagnosis Date  . Diabetes mellitus without complication (HCC)   . Hyperlipidemia   . Hypertension    History reviewed. No pertinent past surgical history. Family History  Problem Relation Age of Onset  . Asthma Other   . Hypertension Other   . Hyperlipidemia Other   . Diabetes Other   . Heart block Mother    Social History  Substance Use Topics  . Smoking status: Former Games developer  . Smokeless tobacco: Never Used  . Alcohol Use: No   OB History    No data available     Review of Systems 10 Systems reviewed and are negative for acute change except as noted in the HPI.    Allergies  Actos and Statins  Home Medications   Prior to Admission medications   Medication Sig Start Date End Date Taking? Authorizing Provider  aspirin 81 MG tablet Take 81 mg  by mouth daily.   Yes Historical Provider, MD  Exenatide ER (BYDUREON) 2 MG SUSR Inject 2 mg into the skin once a week. Saturday   Yes Historical Provider, MD  lisinopril-hydrochlorothiazide (PRINZIDE,ZESTORETIC) 20-12.5 MG per tablet Take 1 tablet by mouth daily.   Yes Historical Provider, MD  metFORMIN (GLUCOPHAGE) 500 MG tablet Take 1,000 mg by mouth 2 (two) times daily with a meal.   Yes Historical Provider, MD  metoprolol tartrate (LOPRESSOR) 25 MG tablet Take 0.5 tablets (12.5 mg total) by mouth 2 (two) times daily. 10/24/14  Yes Lonia Blood, MD  Multiple Vitamins-Calcium (ONE-A-DAY WOMENS PO) Take 1 tablet by mouth daily.   Yes Historical Provider, MD  potassium chloride SA (K-DUR,KLOR-CON) 20 MEQ tablet Take 1 tablet (20 mEq total) by mouth daily. 10/24/14  Yes Lonia Blood, MD  sitaGLIPtin (JANUVIA) 100 MG tablet Take 100 mg by mouth daily.   Yes Historical Provider, MD  diazepam (VALIUM) 5 MG tablet Take 1 tablet (5 mg total) by mouth every 8 (eight) hours as needed for anxiety. Patient not taking: Reported on 07/29/2014 07/17/14   Roxy Horseman, PA-C   BP 107/67 mmHg  Pulse 102  Temp(Src) 98.9 F (37.2 C) (Oral)  Resp 28  Ht 5\' 1"  (1.549 m)  Wt 188 lb 11.2 oz (85.594 kg)  BMI 35.67 kg/m2  SpO2 92% Physical Exam  Constitutional: She is oriented to person, place, and time. She appears well-developed and well-nourished.  Patient is mildly ill in appearance. She is alert  with clear mental status. She is tachypneic but speaking in clear full sentences without appearance of imminent respiratory failure. Mental status is clear.  HENT:  Head: Normocephalic and atraumatic.  Nose: Nose normal.  Bilateral TMs normal without erythema or bulging. No facial tenderness. No rashes. Posterior oropharynx is widely patent no tonsillar erythema or exudate. Tongue is mildly dry with some white plaque on it.  Eyes: EOM are normal. Pupils are equal, round, and reactive to light. Right eye  exhibits no discharge. Left eye exhibits no discharge.  Neck: Neck supple.  No meningismus.  Cardiovascular: Regular rhythm, normal heart sounds and intact distal pulses.   Tachycardia.  Pulmonary/Chest: Effort normal and breath sounds normal. She exhibits no tenderness.  Patient is to Without acute respiratory distress.  Abdominal: Soft. Bowel sounds are normal. She exhibits no distension. There is no tenderness.  Musculoskeletal: Normal range of motion. She exhibits no edema or tenderness.  Neurological: She is alert and oriented to person, place, and time. She has normal strength. No cranial nerve deficit. She exhibits normal muscle tone. Coordination normal. GCS eye subscore is 4. GCS verbal subscore is 5. GCS motor subscore is 6.  Skin: Skin is warm, dry and intact. No rash noted.  Psychiatric: She has a normal mood and affect.    ED Course  Procedures (including critical care time) CRITICAL CARE Performed by: Arby BarrettePfeiffer, Samantha Ragen   Total critical care time: 45  Critical care time was exclusive of separately billable procedures and treating other patients.  Critical care was necessary to treat or prevent imminent or life-threatening deterioration.  Critical care was time spent personally by me on the following activities: development of treatment plan with patient and/or surrogate as well as nursing, discussions with consultants, evaluation of patient's response to treatment, examination of patient, obtaining history from patient or surrogate, ordering and performing treatments and interventions, ordering and review of laboratory studies, ordering and review of radiographic studies, pulse oximetry and re-evaluation of patient's condition. Labs Review Labs Reviewed  COMPREHENSIVE METABOLIC PANEL - Abnormal; Notable for the following:    Chloride 93 (*)    Glucose, Bld 157 (*)    BUN 5 (*)    Albumin 2.9 (*)    AST 136 (*)    Alkaline Phosphatase 496 (*)    Total Bilirubin 1.8 (*)     All other components within normal limits  CBC WITH DIFFERENTIAL/PLATELET - Abnormal; Notable for the following:    WBC 13.2 (*)    RBC 5.12 (*)    MCV 75.8 (*)    MCH 24.6 (*)    Eosinophils Absolute 4.1 (*)    All other components within normal limits  URINALYSIS, ROUTINE W REFLEX MICROSCOPIC (NOT AT [facility]) - Abnormal; Notable for the following:    APPearance CLOUDY (*)    Glucose, UA >1000 (*)    Hgb urine dipstick TRACE (*)    Ketones, ur 40 (*)    Leukocytes, UA SMALL (*)    All other components within normal limits  CBG MONITORING, ED - Abnormal; Notable for the following:    Glucose-Capillary 147 (*)    All other components within normal limits  CULTURE, BLOOD (ROUTINE X 2)  CULTURE, BLOOD (ROUTINE X 2)  URINE CULTURE  URINE MICROSCOPIC-ADD ON  HEPATITIS PANEL, ACUTE  HIV ANTIBODY (ROUTINE TESTING)  SEDIMENTATION RATE  I-STAT CG4 LACTIC ACID, ED  I-STAT CG4 LACTIC ACID, ED  I-STAT CG4 LACTIC ACID, ED  I-STAT CG4 LACTIC ACID, ED  Imaging Review Dg Chest 2 View  10/25/2014  CLINICAL DATA:  Fever and shortness of breath. History of hypertension. EXAM: CHEST  2 VIEW COMPARISON:  10/22/2014 FINDINGS: The heart size and mediastinal contours are within normal limits. Both lungs are clear. No pleural effusion or pneumothorax. Bony thorax is intact. IMPRESSION: No active cardiopulmonary disease. Electronically Signed   By: Amie Portland M.D.   On: 10/25/2014 21:25   Ct Angio Chest Pe W/cm &/or Wo Cm  10/25/2014  CLINICAL DATA:  Dyspnea and fever. EXAM: CT ANGIOGRAPHY CHEST CT ABDOMEN AND PELVIS WITH CONTRAST TECHNIQUE: Multidetector CT imaging of the chest was performed using the standard protocol during bolus administration of intravenous contrast. Multiplanar CT image reconstructions and MIPs were obtained to evaluate the vascular anatomy. Multidetector CT imaging of the abdomen and pelvis was performed using the standard protocol during bolus administration of intravenous  contrast. CONTRAST:  OMNIPAQUE IOHEXOL 350 MG/ML SOLN COMPARISON:  None. FINDINGS: CTA CHEST FINDINGS Technically adequate study with good opacification of the central and segmental pulmonary arteries. No focal filling defects demonstrated. No evidence of significant pulmonary embolus. Normal heart size. Normal caliber thoracic aorta. No evidence of aortic dissection. Calcification of the aorta. Great vessel origins are patent. Mediastinal lymph nodes are not pathologically enlarged. Esophagus is decompressed. Evaluation of lungs is limited due to respiratory motion artifact. There is atelectasis in the lung bases and posterior lungs. There is a 7 mm nodule in the superior segment of the right lower lung. If the patient is at high risk for bronchogenic carcinoma, follow-up chest CT at 3-6 months is recommended. If the patient is at low risk for bronchogenic carcinoma, follow-up chest CT at 6-12 months is recommended. This recommendation follows the consensus statement: Guidelines for Management of Small Pulmonary Nodules Detected on CT Scans: A Statement from the Fleischner Society as published in Radiology 2005; 237:395-400. No focal consolidation. Airways appear patent. No pleural effusion. No pneumothorax. Degenerative changes in the thoracic spine. No destructive bone lesions. CT ABDOMEN and PELVIS FINDINGS Mild diffuse fatty infiltration of the liver. Calcification in the caudate lobe likely representing a granuloma. No focal liver lesions otherwise demonstrated. Gallbladder, bile ducts, spleen, pancreas, adrenal glands, abdominal aorta, inferior vena cava, and retroperitoneal lymph nodes are unremarkable. Cyst in the midpole left kidney measuring 1.4 cm diameter. No hydronephrosis in either kidney. Renal nephrograms are symmetrical. Stomach, small bowel, and colon are not abnormally distended. Stool fills the colon. No free air or free fluid in the abdomen. Abdominal wall musculature appears intact.  Infiltration in the anterior abdominal wall subcutaneous fat consistent with injection sites. Pelvis: The appendix is normal. Uterus and ovaries are not enlarged. Bladder wall is not thickened. No free or loculated pelvic fluid collections. No pelvic mass or lymphadenopathy. Mild degenerative changes in the lumbar spine. No destructive bone lesions. Review of the MIP images confirms the above findings. IMPRESSION: No evidence of significant pulmonary embolus. Dependent atelectasis in the lungs. 7 mm nodule in the right lower lung. See above followup recommendations. No acute process demonstrated in the abdomen or pelvis. Electronically Signed   By: Burman Nieves M.D.   On: 10/25/2014 23:16   Ct Abdomen Pelvis W Contrast  10/25/2014  CLINICAL DATA:  Dyspnea and fever. EXAM: CT ANGIOGRAPHY CHEST CT ABDOMEN AND PELVIS WITH CONTRAST TECHNIQUE: Multidetector CT imaging of the chest was performed using the standard protocol during bolus administration of intravenous contrast. Multiplanar CT image reconstructions and MIPs were obtained to evaluate the vascular  anatomy. Multidetector CT imaging of the abdomen and pelvis was performed using the standard protocol during bolus administration of intravenous contrast. CONTRAST:  OMNIPAQUE IOHEXOL 350 MG/ML SOLN COMPARISON:  None. FINDINGS: CTA CHEST FINDINGS Technically adequate study with good opacification of the central and segmental pulmonary arteries. No focal filling defects demonstrated. No evidence of significant pulmonary embolus. Normal heart size. Normal caliber thoracic aorta. No evidence of aortic dissection. Calcification of the aorta. Great vessel origins are patent. Mediastinal lymph nodes are not pathologically enlarged. Esophagus is decompressed. Evaluation of lungs is limited due to respiratory motion artifact. There is atelectasis in the lung bases and posterior lungs. There is a 7 mm nodule in the superior segment of the right lower lung. If the  patient is at high risk for bronchogenic carcinoma, follow-up chest CT at 3-6 months is recommended. If the patient is at low risk for bronchogenic carcinoma, follow-up chest CT at 6-12 months is recommended. This recommendation follows the consensus statement: Guidelines for Management of Small Pulmonary Nodules Detected on CT Scans: A Statement from the Fleischner Society as published in Radiology 2005; 237:395-400. No focal consolidation. Airways appear patent. No pleural effusion. No pneumothorax. Degenerative changes in the thoracic spine. No destructive bone lesions. CT ABDOMEN and PELVIS FINDINGS Mild diffuse fatty infiltration of the liver. Calcification in the caudate lobe likely representing a granuloma. No focal liver lesions otherwise demonstrated. Gallbladder, bile ducts, spleen, pancreas, adrenal glands, abdominal aorta, inferior vena cava, and retroperitoneal lymph nodes are unremarkable. Cyst in the midpole left kidney measuring 1.4 cm diameter. No hydronephrosis in either kidney. Renal nephrograms are symmetrical. Stomach, small bowel, and colon are not abnormally distended. Stool fills the colon. No free air or free fluid in the abdomen. Abdominal wall musculature appears intact. Infiltration in the anterior abdominal wall subcutaneous fat consistent with injection sites. Pelvis: The appendix is normal. Uterus and ovaries are not enlarged. Bladder wall is not thickened. No free or loculated pelvic fluid collections. No pelvic mass or lymphadenopathy. Mild degenerative changes in the lumbar spine. No destructive bone lesions. Review of the MIP images confirms the above findings. IMPRESSION: No evidence of significant pulmonary embolus. Dependent atelectasis in the lungs. 7 mm nodule in the right lower lung. See above followup recommendations. No acute process demonstrated in the abdomen or pelvis. Electronically Signed   By: Burman Nieves M.D.   On: 10/25/2014 23:16   I have personally reviewed  and evaluated these images and lab results as part of my medical decision-making.   EKG Interpretation   Date/Time:  Saturday October 25 2014 21:45:31 EDT Ventricular Rate:  98 PR Interval:  138 QRS Duration: 81 QT Interval:  339 QTC Calculation: 433 R Axis:   88 Text Interpretation:  Sinus tachycardia Probable anterior infarct, old  Baseline wander in lead(s) V6 no acute ischemia. no change from previous  Confirmed by Donnald Garre, MD, Lebron Conners [phone]) on 10/25/2014 11:16:45 PM      MDM   Final diagnoses:  Fever, unspecified fever cause  Tachypnea  Hepatitis  Leukocytosis   Patient is a febrile illness. Temperature at home has been 103. Here temperature is 102.4. Heart rate 136. Patient does not have significantly localizing symptoms. Complete diagnostic workup has been undertaken. They're currently no evidence of surgical etiology. She does have LFT elevation. Additional diagnostic studies will be added for hepatitis, HIV will be added as well as sed rate. Considerations are for occult bacteremia, viral illness or autoimmune illness. Patient will be admitted for further  monitoring and diagnostic evaluation as indicated.    Arby Barrette, MD 10/25/14 [phone]

## 2014-10-25 NOTE — ED Notes (Signed)
Checked CBG 147 RN Gala MurdochYasauia

## 2014-10-25 NOTE — ED Notes (Signed)
Patient transported to X-ray 

## 2014-10-25 NOTE — ED Notes (Signed)
phlebotomy drawing blood

## 2014-10-26 ENCOUNTER — Encounter (HOSPITAL_COMMUNITY): Payer: Self-pay | Admitting: General Practice

## 2014-10-26 DIAGNOSIS — R7401 Elevation of levels of liver transaminase levels: Secondary | ICD-10-CM | POA: Diagnosis present

## 2014-10-26 DIAGNOSIS — R0682 Tachypnea, not elsewhere classified: Secondary | ICD-10-CM

## 2014-10-26 DIAGNOSIS — E785 Hyperlipidemia, unspecified: Secondary | ICD-10-CM | POA: Diagnosis present

## 2014-10-26 DIAGNOSIS — R740 Nonspecific elevation of levels of transaminase and lactic acid dehydrogenase [LDH]: Secondary | ICD-10-CM

## 2014-10-26 DIAGNOSIS — R6510 Systemic inflammatory response syndrome (SIRS) of non-infectious origin without acute organ dysfunction: Secondary | ICD-10-CM | POA: Diagnosis present

## 2014-10-26 LAB — CBC
HCT: 35.7 % — ABNORMAL LOW (ref 36.0–46.0)
Hemoglobin: 11 g/dL — ABNORMAL LOW (ref 12.0–15.0)
MCH: 23.8 pg — ABNORMAL LOW (ref 26.0–34.0)
MCHC: 30.8 g/dL (ref 30.0–36.0)
MCV: 77.1 fL — AB (ref 78.0–100.0)
PLATELETS: 215 10*3/uL (ref 150–400)
RBC: 4.63 MIL/uL (ref 3.87–5.11)
RDW: 15.6 % — AB (ref 11.5–15.5)
WBC: 13.7 10*3/uL — AB (ref 4.0–10.5)

## 2014-10-26 LAB — BASIC METABOLIC PANEL
Anion gap: 10 (ref 5–15)
BUN: <5 mg/dL — ABNORMAL LOW (ref 6–20)
CHLORIDE: 98 mmol/L — AB (ref 101–111)
CO2: 29 mmol/L (ref 22–32)
CREATININE: 0.82 mg/dL (ref 0.44–1.00)
Calcium: 8.3 mg/dL — ABNORMAL LOW (ref 8.9–10.3)
Glucose, Bld: 145 mg/dL — ABNORMAL HIGH (ref 65–99)
POTASSIUM: 3.3 mmol/L — AB (ref 3.5–5.1)
SODIUM: 137 mmol/L (ref 135–145)

## 2014-10-26 LAB — GLUCOSE, CAPILLARY
Glucose-Capillary: 104 mg/dL — ABNORMAL HIGH (ref 65–99)
Glucose-Capillary: 181 mg/dL — ABNORMAL HIGH (ref 65–99)

## 2014-10-26 LAB — APTT: aPTT: 28 seconds (ref 24–37)

## 2014-10-26 LAB — PROTIME-INR
INR: 1.25 (ref 0.00–1.49)
PROTHROMBIN TIME: 15.8 s — AB (ref 11.6–15.2)

## 2014-10-26 LAB — INFLUENZA PANEL BY PCR (TYPE A & B)
H1N1 flu by pcr: NOT DETECTED
INFLAPCR: NEGATIVE
Influenza B By PCR: NEGATIVE

## 2014-10-26 LAB — SEDIMENTATION RATE: Sed Rate: 57 mm/hr — ABNORMAL HIGH (ref 0–22)

## 2014-10-26 LAB — LACTIC ACID, PLASMA
LACTIC ACID, VENOUS: 1 mmol/L (ref 0.5–2.0)
Lactic Acid, Venous: 0.9 mmol/L (ref 0.5–2.0)

## 2014-10-26 LAB — HIV ANTIBODY (ROUTINE TESTING W REFLEX): HIV Screen 4th Generation wRfx: NONREACTIVE

## 2014-10-26 LAB — PROCALCITONIN: PROCALCITONIN: 0.59 ng/mL

## 2014-10-26 MED ORDER — ASPIRIN EC 81 MG PO TBEC
81.0000 mg | DELAYED_RELEASE_TABLET | Freq: Every day | ORAL | Status: DC
  Administered 2014-10-26 – 2014-10-27 (×2): 81 mg via ORAL
  Filled 2014-10-26 (×3): qty 1

## 2014-10-26 MED ORDER — ALUM & MAG HYDROXIDE-SIMETH 200-200-20 MG/5ML PO SUSP
30.0000 mL | Freq: Four times a day (QID) | ORAL | Status: DC | PRN
  Administered 2014-10-26: 30 mL via ORAL
  Filled 2014-10-26: qty 30

## 2014-10-26 MED ORDER — HYDROMORPHONE HCL 1 MG/ML IJ SOLN: 0.5000 mg | INTRAMUSCULAR | Status: DC | PRN

## 2014-10-26 MED ORDER — SODIUM CHLORIDE 0.9 % IV SOLN
INTRAVENOUS | Status: DC
  Administered 2014-10-26: 01:00:00 via INTRAVENOUS

## 2014-10-26 MED ORDER — ONDANSETRON HCL 4 MG/2ML IJ SOLN: 4.0000 mg | Freq: Four times a day (QID) | INTRAMUSCULAR | Status: DC | PRN

## 2014-10-26 MED ORDER — POTASSIUM CHLORIDE CRYS ER 20 MEQ PO TBCR
20.0000 meq | EXTENDED_RELEASE_TABLET | Freq: Every day | ORAL | Status: DC
  Administered 2014-10-27: 20 meq via ORAL
  Filled 2014-10-26: qty 1

## 2014-10-26 MED ORDER — ONDANSETRON HCL 4 MG PO TABS: 4.0000 mg | ORAL_TABLET | Freq: Four times a day (QID) | ORAL | Status: DC | PRN

## 2014-10-26 MED ORDER — DIPHENHYDRAMINE HCL 25 MG PO CAPS
25.0000 mg | ORAL_CAPSULE | ORAL | Status: AC
Start: 2014-10-26 — End: 2014-10-26
  Administered 2014-10-26: 25 mg via ORAL
  Filled 2014-10-26: qty 1

## 2014-10-26 MED ORDER — DIPHENHYDRAMINE HCL 25 MG PO CAPS: 25.0000 mg | ORAL_CAPSULE | Freq: Four times a day (QID) | ORAL | Status: DC | PRN

## 2014-10-26 MED ORDER — PIPERACILLIN-TAZOBACTAM 3.375 G IVPB 30 MIN: 3.3750 g | Freq: Three times a day (TID) | INTRAVENOUS | Status: DC

## 2014-10-26 MED ORDER — ENOXAPARIN SODIUM 40 MG/0.4ML ~~LOC~~ SOLN
40.0000 mg | SUBCUTANEOUS | Status: DC
  Administered 2014-10-26 – 2014-10-27 (×2): 40 mg via SUBCUTANEOUS
  Filled 2014-10-26 (×2): qty 0.4

## 2014-10-26 MED ORDER — SODIUM CHLORIDE 0.9 % IV SOLN
INTRAVENOUS | Status: AC
  Administered 2014-10-26: 01:00:00 via INTRAVENOUS

## 2014-10-26 MED ORDER — OXYCODONE HCL 5 MG PO TABS: 5.0000 mg | ORAL_TABLET | ORAL | Status: DC | PRN

## 2014-10-26 MED ORDER — POTASSIUM CHLORIDE CRYS ER 20 MEQ PO TBCR: 20.0000 meq | EXTENDED_RELEASE_TABLET | Freq: Every day | ORAL | Status: DC

## 2014-10-26 MED ORDER — ACETAMINOPHEN 325 MG PO TABS: 650.0000 mg | ORAL_TABLET | Freq: Four times a day (QID) | ORAL | Status: DC | PRN

## 2014-10-26 MED ORDER — ACETAMINOPHEN 650 MG RE SUPP: 650.0000 mg | Freq: Four times a day (QID) | RECTAL | Status: DC | PRN

## 2014-10-26 MED ORDER — POTASSIUM CHLORIDE CRYS ER 20 MEQ PO TBCR
60.0000 meq | EXTENDED_RELEASE_TABLET | Freq: Once | ORAL | Status: AC
  Administered 2014-10-26: 60 meq via ORAL
  Filled 2014-10-26: qty 3

## 2014-10-26 MED ORDER — METOPROLOL TARTRATE 12.5 MG HALF TABLET
12.5000 mg | ORAL_TABLET | Freq: Two times a day (BID) | ORAL | Status: DC
  Administered 2014-10-26 – 2014-10-27 (×3): 12.5 mg via ORAL
  Filled 2014-10-26 (×3): qty 1

## 2014-10-26 NOTE — Progress Notes (Addendum)
[facility] PROGRESS NOTE  Brittany [name] ZOX:[id number] DOB: [birth year] DOA: 10/25/2014 PCP: Lupe Carney, MD  HPI/Brief narrative 59 y.o. female with a history of DM2, HTN, and Hyperlipidemia who presents to the ED with complaints of fever, chillsc and cough for the past day prior to admit.At home her temperature was reported to be 103F. She was recently discharged from the hospital after an admission for DKA.Patient was admitted for concerns of possible sepsis  Assessment/Plan: 1. Fevers, not septic 1. Unclear etiology for fevers 2. Blood and urine cx pending 3. Presently afebrile 4. For now, would continue current abx until results of cultures are available 2. Leukocytosis 1. WBC predominantly Eosinophils 2. Patient reports dry cough and itchy eyes 3. Question possible allergic rhinitis. Will give trial of benadryl. Avoid steroid for now given recent admission for DKA 3. DM2 1. Recently admitted for DKA 2. Glucose currently stable 4. Chronic diastolic CHF 1. Clinically euvolemic 2. Cont monitor 5. HLD 1. Not on statin, but would avoid if possible for now given elevated LFT's (see below) 2. Will check lipids in AM 6. Elevated LFT's 1. Unclear etiology 2. Pt is now s/p recent abd [place] and ct abd/pelvis notable only for diffuse fatty infiltration of the liver 7. DVT prophylaxis 1. Lovenox subQ 8. 7mm R lower lung nodule 1. Noted incidentally on imaging 2. Recommendations for repeat CT chest in 6-12 months  Code Status: Full Family Communication: Pt in room, family at bedside Disposition Plan: Pending   Consultants:    Procedures:    Antibiotics: Anti-infectives    Start     Dose/Rate Route Frequency Ordered Stop   10/26/14 0900  vancomycin (VANCOCIN) IVPB 1000 mg/200 mL premix     1,000 mg 200 mL/hr over 60 Minutes Intravenous Every 12 hours 10/25/14 2102     10/26/14 0500  piperacillin-tazobactam (ZOSYN) IVPB 3.375 g  Status:  Discontinued     3.375  g 100 mL/hr over 30 Minutes Intravenous 3 times per day 10/26/14 0057 10/26/14 0059   10/25/14 2200  piperacillin-tazobactam (ZOSYN) IVPB 3.375 g  Status:  Discontinued     3.375 g 100 mL/hr over 30 Minutes Intravenous 3 times per day 10/25/14 2055 10/25/14 2110   10/25/14 2115  piperacillin-tazobactam (ZOSYN) IVPB 3.375 g     3.375 g 12.5 mL/hr over 240 Minutes Intravenous Every 8 hours 10/25/14 2110     10/25/14 2100  vancomycin (VANCOCIN) IVPB 1000 mg/200 mL premix     1,000 mg 200 mL/hr over 60 Minutes Intravenous  Once 10/25/14 2055 10/25/14 2339      HPI/Subjective: Reports feeling better today. Still having dry cough and itchy eyes  Objective: Filed Vitals:   10/26/14 0000 10/26/14 0032 10/26/14 0919 10/26/14 1300  BP: 117/63 124/66 113/64 107/68  Pulse: 93 93 98 94  Temp:  98.8 F (37.1 C) 99.1 F (37.3 C) 98.2 F (36.8 C)  TempSrc:  Oral Oral Oral  Resp: Height:   (1.549 m)    Weight:  83.099 kg (183 lb 3.2 oz)    SpO2: 95% 96% 96% 95%    Intake/Output Summary (Last 24 hours) at 10/26/14 1456 Last data filed at 10/26/14 1400  Gross per 24 hour  Intake 1361.25 ml  Output   2650 ml  Net -1288.75 ml   Filed Weights   10/25/14 2009 10/26/14 0032  Weight: 85.594 kg (188 lb 11.2 oz) 83.099 kg (183 lb 3.2 oz)    Exam:  General:  Awake, in nad  Cardiovascular: regular, s1, s2  Respiratory: normal resp effort, no wheezing  Abdomen: soft, obese, nondistended  Musculoskeletal: perfused, no clubbing   Data Reviewed: Basic Metabolic Panel:  Recent Labs Lab 10/20/14 1716  10/22/14 0524 10/23/14 0314 10/24/14 0338 10/25/14 2006 10/26/14 0440  NA 137  < > 139 143 142 136 137  K 3.2*  < > 3.7 2.8* 3.0* 3.5 3.3*  CL 97*  < > 100* 97* 99* 93* 98*  CO2 19*  < > 23 29 33* 29 29  GLUCOSE 132*  < > 149* 137* 159* 157* 145*  BUN 22*  < > 7 7 6  5* <5*  CREATININE 1.47*  < > 0.80 0.88 0.64 0.83 0.82  CALCIUM 7.7*  < > 8.1* 8.7* 8.7* 9.2  8.3*  MG 2.7*  --   --   --  2.1  --   --   PHOS 3.8  --   --   --   --   --   --   < > = values in this interval not displayed. Liver Function Tests:  Recent Labs Lab 10/20/14 0853 10/21/14 0417 10/23/14 0314 10/24/14 0338 10/25/14 2006  AST 135* 179* 75* 63* 136*  ALT 217* 222* 27 9* 20  ALKPHOS 462* 410* 450* 429* 496*  BILITOT 3.9* 2.2* 1.8* 1.3* 1.8*  PROT 7.2 6.3* 6.9 6.6 7.3  ALBUMIN 2.9* 2.4* 2.3* 2.4* 2.9*    Recent Labs Lab 10/20/14 1426 10/23/14 0314 10/24/14 0338  LIPASE 118* 86* 88*   No results for input(s): AMMONIA in the last 168 hours. CBC:  Recent Labs Lab 10/20/14 0853  10/22/14 0524 10/23/14 0314 10/24/14 0338 10/25/14 2006 10/26/14 0440  WBC 18.7*  < > 14.3* 14.8* 14.5* 13.2* 13.7*  NEUTROABS 14.4*  --   --   --  5.4 7.0  --   HGB 13.0  < > 11.6* 11.9* 11.8* 12.6 11.0*  HCT 42.1  < > 35.7* 37.4 37.1 38.8 35.7*  MCV 76.7*  < > 75.8* 76.0* 76.5* 75.8* 77.1*  PLT 214  < > 211 230 250 232 215  < > = values in this interval not displayed. Cardiac Enzymes:  Recent Labs Lab 10/20/14 1716 10/20/14 2241 10/21/14 1353 10/21/14 1550 10/21/14 2300  CKTOTAL 342*  --   --   --   --   TROPONINI 0.41* 0.31* 0.29* 0.20* 0.17*   BNP (last 3 results) No results for input(s): BNP in the last 8760 hours.  ProBNP (last 3 results) No results for input(s): PROBNP in the last 8760 hours.  CBG:  Recent Labs Lab 10/23/14 2145 10/24/14 0814 10/24/14 1151 10/25/14 2016 10/26/14 0627  GLUCAP 230* 181* 154* 147* 104*    Recent Results (from the past 240 hour(s))  Urine culture     Status: None   Collection Time: 10/20/14 10:10 AM  Result Value Ref Range Status   Specimen Description URINE, RANDOM  Final   Special Requests NONE  Final   Culture NO GROWTH 1 DAY  Final   Report Status 10/21/2014 FINAL  Final  Blood culture (routine x 2)     Status: None   Collection Time: 10/20/14 10:45 AM  Result Value Ref Range Status   Specimen Description  BLOOD LEFT FOREARM  Final   Special Requests BOTTLES DRAWN AEROBIC AND ANAEROBIC 5ML  Final   Culture NO GROWTH 5 DAYS  Final   Report Status 10/25/2014 FINAL  Final  Blood  culture (routine x 2)     Status: None   Collection Time: 10/20/14 11:10 AM  Result Value Ref Range Status   Specimen Description BLOOD LEFT ARM  Final   Special Requests BOTTLES DRAWN AEROBIC AND ANAEROBIC  Final   Culture NO GROWTH 5 DAYS  Final   Report Status 10/25/2014 FINAL  Final  MRSA PCR Screening     Status: None   Collection Time: 10/20/14  7:07 PM  Result Value Ref Range Status   MRSA by PCR NEGATIVE NEGATIVE Final    Comment:        The GeneXpert MRSA Assay (FDA approved for NASAL specimens only), is one component of a comprehensive MRSA colonization surveillance program. It is not intended to diagnose MRSA infection nor to guide or monitor treatment for MRSA infections.   MRSA PCR Screening     Status: None   Collection Time: 10/21/14 10:45 PM  Result Value Ref Range Status   MRSA by PCR NEGATIVE NEGATIVE Final    Comment:        The GeneXpert MRSA Assay (FDA approved for NASAL specimens only), is one component of a comprehensive MRSA colonization surveillance program. It is not intended to diagnose MRSA infection nor to guide or monitor treatment for MRSA infections.   Culture, blood (routine x 2)     Status: None (Preliminary result)   Collection Time: 10/25/14  7:51 PM  Result Value Ref Range Status   Specimen Description BLOOD RIGHT ARM  Final   Special Requests BOTTLES DRAWN AEROBIC AND ANAEROBIC 10CC   Final   Culture NO GROWTH < 24 HOURS  Final   Report Status PENDING  Incomplete  Culture, blood (routine x 2)     Status: None (Preliminary result)   Collection Time: 10/25/14  8:01 PM  Result Value Ref Range Status   Specimen Description BLOOD RIGHT HAND  Final   Special Requests BOTTLES DRAWN AEROBIC ONLY 10CC  Final   Culture NO GROWTH < 24 HOURS  Final   Report Status  PENDING  Incomplete  Urine culture     Status: None (Preliminary result)   Collection Time: 10/25/14  8:09 PM  Result Value Ref Range Status   Specimen Description URINE, CLEAN CATCH  Final   Special Requests NONE  Final   Culture NO GROWTH < 12 HOURS  Final   Report Status PENDING  Incomplete     Studies: Dg Chest 2 View  10/25/2014  CLINICAL DATA:  Fever and shortness of breath. History of hypertension. EXAM: CHEST  2 VIEW COMPARISON:  10/22/2014 FINDINGS: The heart size and mediastinal contours are within normal limits. Both lungs are clear. No pleural effusion or pneumothorax. Bony thorax is intact. IMPRESSION: No active cardiopulmonary disease. Electronically Signed   By: Amie Portland M.D.   On: 10/25/2014 21:25   Ct Angio Chest Pe W/cm &/or Wo Cm  10/25/2014  CLINICAL DATA:  Dyspnea and fever. EXAM: CT ANGIOGRAPHY CHEST CT ABDOMEN AND PELVIS WITH CONTRAST TECHNIQUE: Multidetector CT imaging of the chest was performed using the standard protocol during bolus administration of intravenous contrast. Multiplanar CT image reconstructions and MIPs were obtained to evaluate the vascular anatomy. Multidetector CT imaging of the abdomen and pelvis was performed using the standard protocol during bolus administration of intravenous contrast. CONTRAST:  OMNIPAQUE IOHEXOL 350 MG/ML SOLN COMPARISON:  None. FINDINGS: CTA CHEST FINDINGS Technically adequate study with good opacification of the central and segmental pulmonary arteries. No focal filling defects  demonstrated. No evidence of significant pulmonary embolus. Normal heart size. Normal caliber thoracic aorta. No evidence of aortic dissection. Calcification of the aorta. Great vessel origins are patent. Mediastinal lymph nodes are not pathologically enlarged. Esophagus is decompressed. Evaluation of lungs is limited due to respiratory motion artifact. There is atelectasis in the lung bases and posterior lungs. There is a 7 mm nodule in the  superior segment of the right lower lung. If the patient is at high risk for bronchogenic carcinoma, follow-up chest CT at 3-6 months is recommended. If the patient is at low risk for bronchogenic carcinoma, follow-up chest CT at 6-12 months is recommended. This recommendation follows the consensus statement: Guidelines for Management of Small Pulmonary Nodules Detected on CT Scans: A Statement from the Fleischner Society as published in Radiology 2005; 237:395-400. No focal consolidation. Airways appear patent. No pleural effusion. No pneumothorax. Degenerative changes in the thoracic spine. No destructive bone lesions. CT ABDOMEN and PELVIS FINDINGS Mild diffuse fatty infiltration of the liver. Calcification in the caudate lobe likely representing a granuloma. No focal liver lesions otherwise demonstrated. Gallbladder, bile ducts, spleen, pancreas, adrenal glands, abdominal aorta, inferior vena cava, and retroperitoneal lymph nodes are unremarkable. Cyst in the midpole left kidney measuring 1.4 cm diameter. No hydronephrosis in either kidney. Renal nephrograms are symmetrical. Stomach, small bowel, and colon are not abnormally distended. Stool fills the colon. No free air or free fluid in the abdomen. Abdominal wall musculature appears intact. Infiltration in the anterior abdominal wall subcutaneous fat consistent with injection sites. Pelvis: The appendix is normal. Uterus and ovaries are not enlarged. Bladder wall is not thickened. No free or loculated pelvic fluid collections. No pelvic mass or lymphadenopathy. Mild degenerative changes in the lumbar spine. No destructive bone lesions. Review of the MIP images confirms the above findings. IMPRESSION: No evidence of significant pulmonary embolus. Dependent atelectasis in the lungs. 7 mm nodule in the right lower lung. See above followup recommendations. No acute process demonstrated in the abdomen or pelvis. Electronically Signed   By: Burman Nieves M.D.    On: 10/25/2014 23:16   Ct Abdomen Pelvis W Contrast  10/25/2014  CLINICAL DATA:  Dyspnea and fever. EXAM: CT ANGIOGRAPHY CHEST CT ABDOMEN AND PELVIS WITH CONTRAST TECHNIQUE: Multidetector CT imaging of the chest was performed using the standard protocol during bolus administration of intravenous contrast. Multiplanar CT image reconstructions and MIPs were obtained to evaluate the vascular anatomy. Multidetector CT imaging of the abdomen and pelvis was performed using the standard protocol during bolus administration of intravenous contrast. CONTRAST:  OMNIPAQUE IOHEXOL 350 MG/ML SOLN COMPARISON:  None. FINDINGS: CTA CHEST FINDINGS Technically adequate study with good opacification of the central and segmental pulmonary arteries. No focal filling defects demonstrated. No evidence of significant pulmonary embolus. Normal heart size. Normal caliber thoracic aorta. No evidence of aortic dissection. Calcification of the aorta. Great vessel origins are patent. Mediastinal lymph nodes are not pathologically enlarged. Esophagus is decompressed. Evaluation of lungs is limited due to respiratory motion artifact. There is atelectasis in the lung bases and posterior lungs. There is a 7 mm nodule in the superior segment of the right lower lung. If the patient is at high risk for bronchogenic carcinoma, follow-up chest CT at 3-6 months is recommended. If the patient is at low risk for bronchogenic carcinoma, follow-up chest CT at 6-12 months is recommended. This recommendation follows the consensus statement: Guidelines for Management of Small Pulmonary Nodules Detected on CT Scans: A Statement from the Fleischner  Society as published in Radiology 2005; [id number]. No focal consolidation. Airways appear patent. No pleural effusion. No pneumothorax. Degenerative changes in the thoracic spine. No destructive bone lesions. CT ABDOMEN and PELVIS FINDINGS Mild diffuse fatty infiltration of the liver. Calcification in the  caudate lobe likely representing a granuloma. No focal liver lesions otherwise demonstrated. Gallbladder, bile ducts, spleen, pancreas, adrenal glands, abdominal aorta, inferior vena cava, and retroperitoneal lymph nodes are unremarkable. Cyst in the midpole left kidney measuring 1.4 cm diameter. No hydronephrosis in either kidney. Renal nephrograms are symmetrical. Stomach, small bowel, and colon are not abnormally distended. Stool fills the colon. No free air or free fluid in the abdomen. Abdominal wall musculature appears intact. Infiltration in the anterior abdominal wall subcutaneous fat consistent with injection sites. Pelvis: The appendix is normal. Uterus and ovaries are not enlarged. Bladder wall is not thickened. No free or loculated pelvic fluid collections. No pelvic mass or lymphadenopathy. Mild degenerative changes in the lumbar spine. No destructive bone lesions. Review of the MIP images confirms the above findings. IMPRESSION: No evidence of significant pulmonary embolus. Dependent atelectasis in the lungs. 7 mm nodule in the right lower lung. See above followup recommendations. No acute process demonstrated in the abdomen or pelvis. Electronically Signed   By: Burman Nieves M.D.   On: 10/25/2014 23:16    Scheduled Meds: . aspirin EC  81 mg Oral Daily  . enoxaparin (LOVENOX) injection  40 mg Subcutaneous Q24H  . metoprolol tartrate  12.5 mg Oral BID  . piperacillin-tazobactam (ZOSYN)  IV  3.375 g Intravenous Q8H  . [START ON 10/27/2014] potassium chloride SA  20 mEq Oral Daily  . vancomycin  1,000 mg Intravenous Q12H   Continuous Infusions: . sodium chloride Stopped (10/25/14 2339)  . sodium chloride 75 mL/hr at 10/26/14 0103    Principal Problem:   SIRS (systemic inflammatory response syndrome) (HCC) Active Problems:   Diabetes mellitus type 2, uncontrolled, with complications (HCC)   Chronic diastolic congestive heart failure (HCC)   Leukocytosis   Sepsis (HCC)    Tachypnea   Hyperlipidemia   Elevated transaminase level   CHIU, STEPHEN K  [facility] Pager [phone]. If 7PM-7AM, please contact night-coverage at [email or web address], password [facility] 10/26/2014, 2:56 PM  LOS: 1 day

## 2014-10-26 NOTE — Progress Notes (Signed)
   10/26/14 0032  Vitals  Temp 98.8 F (37.1 C)  Temp Source Oral  BP 124/66 mmHg  BP Location Left Arm  BP Method Automatic  Patient Position (if appropriate) Lying  Pulse Rate 93  Pulse Rate Source Dinamap  ECG Heart Rate 79  Resp 18  Oxygen Therapy  SpO2 96 %  O2 Device Room Air  Height and Weight  Height 5\' 1"  (1.549 m)  Weight 83.099 kg (183 lb 3.2 oz)  Type of Scale Used Standing (scale c)  BSA (Calculated - sq m) 1.89 sq meters  BMI (Calculated) 34.7  Weight in (lb) to have BMI = 25 132  Admitted pt to rm 3E11 from ED, pt alert and oriented, denied pain at this time, admission assessment  done, orders carried out.

## 2014-10-26 NOTE — H&P (Signed)
[facility] Admission History and Physical       Brittany [name] WUJ:[id number] DOB: 23-May-1955 DOA: 10/25/2014  Referring physician: EDP PCP: Lupe Carney, MD  Specialists:   Chief Complaint: Fever and Chills  HPI: Brittany [name] is a 59 y.o. female with a history of DM2, HTN, and Hyperlipidemia who presents to the ED with complaints of Fever and Chills and Cough for the past 24 hours.   At home her temperature was 103. She was just discharged from the hospital 1 day ago after an admission for DKA.   Tonight in the ED, she was evaluated and a Sepsis Workup was initiated, and she was placed on IV Vancomycin and Zosyn.     Review of Systems:  Constitutional: No Weight Loss, No Weight Gain, Night Sweats, +Fevers, +Chills, Dizziness, Light Headedness, Fatigue, or Generalized Weakness HEENT: No Headaches, Difficulty Swallowing,Tooth/Dental Problems,Sore Throat,  No Sneezing, Rhinitis, Ear Ache, Nasal Congestion, or Post Nasal Drip,  Cardio-vascular:  No Chest pain, Orthopnea, PND, Edema in Lower Extremities, Anasarca, Dizziness, Palpitations  Resp: No Dyspnea, No DOE, No Productive Cough, +Non-Productive Cough, No Hemoptysis, No Wheezing.    GI: No Heartburn, Indigestion, Abdominal Pain, Nausea, Vomiting, Diarrhea, Constipation, Hematemesis, Hematochezia, Melena, Change in Bowel Habits,  Loss of Appetite  GU: No Dysuria, No Change in Color of Urine, No Urgency or Urinary Frequency, No Flank pain.  Musculoskeletal: No Joint Pain or Swelling, No Decreased Range of Motion, No Back Pain.  Neurologic: No Syncope, No Seizures, Muscle Weakness, Paresthesia, Vision Disturbance or Loss, No Diplopia, No Vertigo, No Difficulty Walking,  Skin: No Rash or Lesions. Psych: No Change in Mood or Affect, No Depression or Anxiety, No Memory loss, No Confusion, or Hallucinations   Past Medical History  Diagnosis Date  . Diabetes mellitus without complication (HCC)   . Hyperlipidemia   .  Hypertension      History reviewed. No pertinent past surgical history.    Prior to Admission medications   Medication Sig Start Date End Date Taking? Authorizing Provider  aspirin 81 MG tablet Take 81 mg by mouth daily.   Yes Historical Provider, MD  Exenatide ER (BYDUREON) 2 MG SUSR Inject 2 mg into the skin once a week. Saturday   Yes Historical Provider, MD  lisinopril-hydrochlorothiazide (PRINZIDE,ZESTORETIC) 20-12.5 MG per tablet Take 1 tablet by mouth daily.   Yes Historical Provider, MD  metFORMIN (GLUCOPHAGE) 500 MG tablet Take 1,000 mg by mouth 2 (two) times daily with a meal.   Yes Historical Provider, MD  metoprolol tartrate (LOPRESSOR) 25 MG tablet Take 0.5 tablets (12.5 mg total) by mouth 2 (two) times daily. 10/24/14  Yes Lonia Blood, MD  Multiple Vitamins-Calcium (ONE-A-DAY WOMENS PO) Take 1 tablet by mouth daily.   Yes Historical Provider, MD  potassium chloride SA (K-DUR,KLOR-CON) 20 MEQ tablet Take 1 tablet (20 mEq total) by mouth daily. 10/24/14  Yes Lonia Blood, MD  sitaGLIPtin (JANUVIA) 100 MG tablet Take 100 mg by mouth daily.   Yes Historical Provider, MD  diazepam (VALIUM) 5 MG tablet Take 1 tablet (5 mg total) by mouth every 8 (eight) hours as needed for anxiety. Patient not taking: Reported on 07/29/2014 07/17/14   Roxy Horseman, PA-C     Allergies  Allergen Reactions  . Actos [Pioglitazone] Other (See Comments)    Headache and myalgia  . Statins Nausea Only    Social History:  reports that she has quit smoking. She has never used smokeless tobacco. She reports that  she does not drink alcohol or use illicit drugs.    Family History  Problem Relation Age of Onset  . Asthma Other   . Hypertension Other   . Hyperlipidemia Other   . Diabetes Other   . Heart block Mother        Physical Exam:  GEN:  Pleasant Obese  59 y.o. [place] female examined and in no acute distress; cooperative with exam Filed Vitals:   10/25/14 2330  10/25/14 2345 10/26/14 0000 10/26/14 0032  BP: 123/70 116/73 117/63 124/66  Pulse: 94 93 93 93  Temp:    98.8 F (37.1 C)  TempSrc:    Oral  Resp: 23 23 28 18   Height:    5\' 1"  (1.549 m)  Weight:    83.099 kg (183 lb 3.2 oz)  SpO2: 96% 95% 95% 96%   Blood pressure 124/66, pulse 93, temperature 98.8 F (37.1 C), temperature source Oral, resp. rate 18, height 5\' 1"  (1.549 m), weight 83.099 kg (183 lb 3.2 oz), SpO2 96 %. PSYCH: She is alert and oriented x4; does not appear anxious does not appear depressed; affect is normal HEENT: Normocephalic and Atraumatic, Mucous membranes pink; PERRLA; EOM intact; Fundi:  Benign;  No scleral icterus, Nares: Patent, Oropharynx: Clear,  Fair Dentition,    Neck:  FROM, No Cervical Lymphadenopathy nor Thyromegaly or Carotid Bruit; No JVD; Breasts:: Not examined CHEST WALL: No tenderness CHEST: Normal respiration, clear to auscultation bilaterally HEART: Regular rate and rhythm; no murmurs rubs or gallops BACK: No kyphosis or scoliosis; No CVA tenderness ABDOMEN: Positive Bowel Sounds, Obese, Soft Non-Tender, No Rebound or Guarding; No Masses, No Organomegaly, No Pannus; No Intertriginous candida. Rectal Exam: Not done EXTREMITIES: No Cyanosis, Clubbing, or Edema; No Ulcerations. Genitalia: not examined PULSES: 2+ and symmetric SKIN: Normal hydration no rash or ulceration CNS:  Alert and Oriented x 4, No Focal Deficits Vascular: pulses palpable throughout    Labs on Admission:  Basic Metabolic Panel:  Recent Labs Lab 10/20/14 1716  10/21/14 2300 10/22/14 0524 10/23/14 0314 10/24/14 0338 10/25/14 2006  NA 137  < > 137 139 143 142 136  K 3.2*  < > 3.0* 3.7 2.8* 3.0* 3.5  CL 97*  < > 99* 100* 97* 99* 93*  CO2 19*  < > 25 23 29  33* 29  GLUCOSE 132*  < > 127* 149* 137* 159* 157*  BUN 22*  < > 6 7 7 6  5*  CREATININE 1.47*  < > 0.82 0.80 0.88 0.64 0.83  CALCIUM 7.7*  < > 7.9* 8.1* 8.7* 8.7* 9.2  MG 2.7*  --   --   --   --  2.1  --   PHOS 3.8   --   --   --   --   --   --   < > = values in this interval not displayed. Liver Function Tests:  Recent Labs Lab 10/20/14 0853 10/21/14 0417 10/23/14 0314 10/24/14 0338 10/25/14 2006  AST 135* 179* 75* 63* 136*  ALT 217* 222* 27 9* 20  ALKPHOS 462* 410* 450* 429* 496*  BILITOT 3.9* 2.2* 1.8* 1.3* 1.8*  PROT 7.2 6.3* 6.9 6.6 7.3  ALBUMIN 2.9* 2.4* 2.3* 2.4* 2.9*    Recent Labs Lab 10/20/14 1426 10/23/14 0314 10/24/14 0338  LIPASE 118* 86* 88*   No results for input(s): AMMONIA in the last 168 hours. CBC:  Recent Labs Lab 10/20/14 0853 10/21/14 0417 10/22/14 0524 10/23/14 0314 10/24/14 0338 10/25/14 2006  WBC 18.7* 13.9* 14.3* 14.8* 14.5* 13.2*  NEUTROABS 14.4*  --   --   --  5.4 7.0  HGB 13.0 11.5* 11.6* 11.9* 11.8* 12.6  HCT 42.1 36.0 35.7* 37.4 37.1 38.8  MCV 76.7* 75.9* 75.8* 76.0* 76.5* 75.8*  PLT 214 185 211 230 250 232   Cardiac Enzymes:  Recent Labs Lab 10/20/14 1716 10/20/14 2241 10/21/14 1353 10/21/14 1550 10/21/14 2300  CKTOTAL 342*  --   --   --   --   TROPONINI 0.41* 0.31* 0.29* 0.20* 0.17*    BNP (last 3 results) No results for input(s): BNP in the last 8760 hours.  ProBNP (last 3 results) No results for input(s): PROBNP in the last 8760 hours.  CBG:  Recent Labs Lab 10/23/14 1541 10/23/14 2145 10/24/14 0814 10/24/14 1151 10/25/14 2016  GLUCAP 198* 230* 181* 154* 147*    Radiological Exams on Admission: Dg Chest 2 View  10/25/2014  CLINICAL DATA:  Fever and shortness of breath. History of hypertension. EXAM: CHEST  2 VIEW COMPARISON:  10/22/2014 FINDINGS: The heart size and mediastinal contours are within normal limits. Both lungs are clear. No pleural effusion or pneumothorax. Bony thorax is intact. IMPRESSION: No active cardiopulmonary disease. Electronically Signed   By: Amie Portland M.D.   On: 10/25/2014 21:25   Ct Angio Chest Pe W/cm &/or Wo Cm  10/25/2014  CLINICAL DATA:  Dyspnea and fever. EXAM: CT ANGIOGRAPHY  CHEST CT ABDOMEN AND PELVIS WITH CONTRAST TECHNIQUE: Multidetector CT imaging of the chest was performed using the standard protocol during bolus administration of intravenous contrast. Multiplanar CT image reconstructions and MIPs were obtained to evaluate the vascular anatomy. Multidetector CT imaging of the abdomen and pelvis was performed using the standard protocol during bolus administration of intravenous contrast. CONTRAST:  OMNIPAQUE IOHEXOL 350 MG/ML SOLN COMPARISON:  None. FINDINGS: CTA CHEST FINDINGS Technically adequate study with good opacification of the central and segmental pulmonary arteries. No focal filling defects demonstrated. No evidence of significant pulmonary embolus. Normal heart size. Normal caliber thoracic aorta. No evidence of aortic dissection. Calcification of the aorta. Great vessel origins are patent. Mediastinal lymph nodes are not pathologically enlarged. Esophagus is decompressed. Evaluation of lungs is limited due to respiratory motion artifact. There is atelectasis in the lung bases and posterior lungs. There is a 7 mm nodule in the superior segment of the right lower lung. If the patient is at high risk for bronchogenic carcinoma, follow-up chest CT at 3-6 months is recommended. If the patient is at low risk for bronchogenic carcinoma, follow-up chest CT at 6-12 months is recommended. This recommendation follows the consensus statement: Guidelines for Management of Small Pulmonary Nodules Detected on CT Scans: A Statement from the Fleischner Society as published in Radiology 2005; 237:395-400. No focal consolidation. Airways appear patent. No pleural effusion. No pneumothorax. Degenerative changes in the thoracic spine. No destructive bone lesions. CT ABDOMEN and PELVIS FINDINGS Mild diffuse fatty infiltration of the liver. Calcification in the caudate lobe likely representing a granuloma. No focal liver lesions otherwise demonstrated. Gallbladder, bile ducts, spleen,  pancreas, adrenal glands, abdominal aorta, inferior vena cava, and retroperitoneal lymph nodes are unremarkable. Cyst in the midpole left kidney measuring 1.4 cm diameter. No hydronephrosis in either kidney. Renal nephrograms are symmetrical. Stomach, small bowel, and colon are not abnormally distended. Stool fills the colon. No free air or free fluid in the abdomen. Abdominal wall musculature appears intact. Infiltration in the anterior abdominal wall subcutaneous fat consistent with injection  sites. Pelvis: The appendix is normal. Uterus and ovaries are not enlarged. Bladder wall is not thickened. No free or loculated pelvic fluid collections. No pelvic mass or lymphadenopathy. Mild degenerative changes in the lumbar spine. No destructive bone lesions. Review of the MIP images confirms the above findings. IMPRESSION: No evidence of significant pulmonary embolus. Dependent atelectasis in the lungs. 7 mm nodule in the right lower lung. See above followup recommendations. No acute process demonstrated in the abdomen or pelvis. Electronically Signed   By: Burman Nieves M.D.   On: 10/25/2014 23:16   Ct Abdomen Pelvis W Contrast  10/25/2014  CLINICAL DATA:  Dyspnea and fever. EXAM: CT ANGIOGRAPHY CHEST CT ABDOMEN AND PELVIS WITH CONTRAST TECHNIQUE: Multidetector CT imaging of the chest was performed using the standard protocol during bolus administration of intravenous contrast. Multiplanar CT image reconstructions and MIPs were obtained to evaluate the vascular anatomy. Multidetector CT imaging of the abdomen and pelvis was performed using the standard protocol during bolus administration of intravenous contrast. CONTRAST:  OMNIPAQUE IOHEXOL 350 MG/ML SOLN COMPARISON:  None. FINDINGS: CTA CHEST FINDINGS Technically adequate study with good opacification of the central and segmental pulmonary arteries. No focal filling defects demonstrated. No evidence of significant pulmonary embolus. Normal heart size.  Normal caliber thoracic aorta. No evidence of aortic dissection. Calcification of the aorta. Great vessel origins are patent. Mediastinal lymph nodes are not pathologically enlarged. Esophagus is decompressed. Evaluation of lungs is limited due to respiratory motion artifact. There is atelectasis in the lung bases and posterior lungs. There is a 7 mm nodule in the superior segment of the right lower lung. If the patient is at high risk for bronchogenic carcinoma, follow-up chest CT at 3-6 months is recommended. If the patient is at low risk for bronchogenic carcinoma, follow-up chest CT at 6-12 months is recommended. This recommendation follows the consensus statement: Guidelines for Management of Small Pulmonary Nodules Detected on CT Scans: A Statement from the Fleischner Society as published in Radiology 2005; 237:395-400. No focal consolidation. Airways appear patent. No pleural effusion. No pneumothorax. Degenerative changes in the thoracic spine. No destructive bone lesions. CT ABDOMEN and PELVIS FINDINGS Mild diffuse fatty infiltration of the liver. Calcification in the caudate lobe likely representing a granuloma. No focal liver lesions otherwise demonstrated. Gallbladder, bile ducts, spleen, pancreas, adrenal glands, abdominal aorta, inferior vena cava, and retroperitoneal lymph nodes are unremarkable. Cyst in the midpole left kidney measuring 1.4 cm diameter. No hydronephrosis in either kidney. Renal nephrograms are symmetrical. Stomach, small bowel, and colon are not abnormally distended. Stool fills the colon. No free air or free fluid in the abdomen. Abdominal wall musculature appears intact. Infiltration in the anterior abdominal wall subcutaneous fat consistent with injection sites. Pelvis: The appendix is normal. Uterus and ovaries are not enlarged. Bladder wall is not thickened. No free or loculated pelvic fluid collections. No pelvic mass or lymphadenopathy. Mild degenerative changes in the lumbar  spine. No destructive bone lesions. Review of the MIP images confirms the above findings. IMPRESSION: No evidence of significant pulmonary embolus. Dependent atelectasis in the lungs. 7 mm nodule in the right lower lung. See above followup recommendations. No acute process demonstrated in the abdomen or pelvis. Electronically Signed   By: Burman Nieves M.D.   On: 10/25/2014 23:16     EKG: Independently reviewed. Normal Sinus Rhythm rate =98, Old Anterior Infarct Changes Present.      Assessment/Plan:      59 y.o. female with  Principal Problem:  1.   SIRS (systemic inflammatory response syndrome) (HCC)/Sepsis (HCC)   Sepsis Workup   Blood Cultures   IV Vancomycin and Zosyn   IVFs   Active Problems:      2.    Diabetes mellitus type 2, uncontrolled, with complications (HCC)   Hold Januvia, and Metformin and    SSI coverage PRN   Check HbA1C     3.    Chronic diastolic congestive heart failure (HCC)   Monitor I/Os     4.    Leukocytosis- due to #1   Monitor Trend     5.   Tachypnea   Due to #1     6.    Hyperlipidemia   Not on Cholesterol Rx     7.    Elevated transaminase level - due to #3   Monitor Trend   Check Hepatitis Panel     8.    DVT Prophylaxis   Lovenox    Code Status:     FULL CODE     Family Communication:   Family at Bedside     Disposition Plan:    Inpatient  Status        Time spent:  63 Minutes      Ron Parker [facility] Pager [phone]   If 7AM -7PM Please Contact the Day Rounding Team MD for [facility]  If 7PM-7AM, Please Contact Night-Floor Coverage  [email or web address] Password [email or web address] 10/26/2014, 12:37 AM     ADDENDUM:   Patient was seen and examined on 10/26/2014

## 2014-10-27 DIAGNOSIS — R0682 Tachypnea, not elsewhere classified: Secondary | ICD-10-CM

## 2014-10-27 LAB — CBC WITH DIFFERENTIAL/PLATELET
BASOS ABS: 0 10*3/uL (ref 0.0–0.1)
Basophils Relative: 0 %
EOS ABS: 5 10*3/uL — AB (ref 0.0–0.7)
EOS PCT: 37 %
HCT: 32.2 % — ABNORMAL LOW (ref 36.0–46.0)
Hemoglobin: 10 g/dL — ABNORMAL LOW (ref 12.0–15.0)
Lymphocytes Relative: 14 %
Lymphs Abs: 1.9 10*3/uL (ref 0.7–4.0)
MCH: 24.5 pg — ABNORMAL LOW (ref 26.0–34.0)
MCHC: 31.1 g/dL (ref 30.0–36.0)
MCV: 78.9 fL (ref 78.0–100.0)
MONO ABS: 0.9 10*3/uL (ref 0.1–1.0)
Monocytes Relative: 7 %
NEUTROS PCT: 42 %
Neutro Abs: 5.6 10*3/uL (ref 1.7–7.7)
PLATELETS: 200 10*3/uL (ref 150–400)
RBC: 4.08 MIL/uL (ref 3.87–5.11)
RDW: 15.9 % — ABNORMAL HIGH (ref 11.5–15.5)
WBC: 13.4 10*3/uL — AB (ref 4.0–10.5)

## 2014-10-27 LAB — COMPREHENSIVE METABOLIC PANEL
ALBUMIN: 2.5 g/dL — AB (ref 3.5–5.0)
ALT: 47 U/L (ref 14–54)
AST: 107 U/L — AB (ref 15–41)
Alkaline Phosphatase: 426 U/L — ABNORMAL HIGH (ref 38–126)
Anion gap: 9 (ref 5–15)
BUN: 6 mg/dL (ref 6–20)
CHLORIDE: 103 mmol/L (ref 101–111)
CO2: 31 mmol/L (ref 22–32)
CREATININE: 0.89 mg/dL (ref 0.44–1.00)
Calcium: 8.5 mg/dL — ABNORMAL LOW (ref 8.9–10.3)
GFR calc Af Amer: >60 mL/min (ref >60)
GFR calc non Af Amer: >60 mL/min (ref >60)
Glucose, Bld: 171 mg/dL — ABNORMAL HIGH (ref 65–99)
POTASSIUM: 3.8 mmol/L (ref 3.5–5.1)
SODIUM: 143 mmol/L (ref 135–145)
Total Bilirubin: 1.2 mg/dL (ref 0.3–1.2)
Total Protein: 6.2 g/dL — ABNORMAL LOW (ref 6.5–8.1)

## 2014-10-27 LAB — LIPID PANEL
CHOL/HDL RATIO: 5.2 ratio
Cholesterol: 94 mg/dL (ref 0–200)
HDL: 18 mg/dL — ABNORMAL LOW (ref >40)
LDL CALC: 52 mg/dL (ref 0–99)
Triglycerides: 120 mg/dL (ref <150)
VLDL: 24 mg/dL (ref 0–40)

## 2014-10-27 LAB — GLUCOSE, CAPILLARY
GLUCOSE-CAPILLARY: 158 mg/dL — AB (ref 65–99)
Glucose-Capillary: 114 mg/dL — ABNORMAL HIGH (ref 65–99)

## 2014-10-27 LAB — HEPATITIS PANEL, ACUTE
HCV Ab: <0.1 s/co ratio (ref 0.0–0.9)
HEP B C IGM: NEGATIVE
HEP B S AG: NEGATIVE
Hep A IgM: NEGATIVE

## 2014-10-27 MED ORDER — LORATADINE-PSEUDOEPHEDRINE ER 10-240 MG PO TB24: 1.0000 | ORAL_TABLET | Freq: Every day | ORAL | Status: DC

## 2014-10-27 MED ORDER — LEVOFLOXACIN 500 MG PO TABS: 500.0000 mg | ORAL_TABLET | Freq: Every day | ORAL | Status: DC

## 2014-10-27 NOTE — Clinical Documentation Improvement (Signed)
Internal Medicine  Abnormal Lab/Test Results:   Component      Hemoglobin HCT  Latest Ref Rng      12.0 - 15.0 g/dL 96.036.0 - 45.446.0 %  09/81/191410/15/2016     8:06 PM 12.6 38.8  10/26/2014     4:40 AM 11.0 (L) 35.7 (L)  10/27/2014      10.0 (L) 32.2 (L)    Possible Clinical Conditions associated with below indicators  Anemia (if present, please document acuity and type)  >acute, chronic, acute on chronic  >acute blood loss anemia, anemia of chronic disease, iron deficiency anemia, etc.  Other Condition  Cannot Clinically Determine  Please exercise your independent, professional judgment when responding. A specific answer is not anticipated or expected. Please update your documentation within the medical record to reflect your response to this query.   Thank you, Doy MinceVangela Lacoya Wilbanks, RN [phone] Clinical Documentation Specialist

## 2014-10-27 NOTE — Discharge Summary (Signed)
Physician Discharge Summary  Brittany [name] ZOX:[id number] DOB: 1955-11-29 DOA: 10/25/2014  PCP: Lupe Carney, MD  Admit date: 10/25/2014 Discharge date: 10/27/2014  Time spent: 20 minutes  Recommendations for Outpatient Follow-up:  1. Follow up with PCP in 1-2 weeks 2. Please repeat comprehensive metabolic profile within one month, focus on LFT's 3. May consider outpatient GI referral for elevated LFT's 4. Please repeat CT chest in 6-12 months to evaluate incidental lung nodule  Discharge Diagnoses:  Principal Problem:   SIRS (systemic inflammatory response syndrome) (HCC) Active Problems:   Diabetes mellitus type 2, uncontrolled, with complications (HCC)   Hypokalemia   Chronic diastolic congestive heart failure (HCC)   Leukocytosis   Sepsis (HCC)   Tachypnea   Hyperlipidemia   Elevated transaminase level   Discharge Condition: Improved  Diet recommendation: Diabetic, heart healthy  Filed Weights   10/25/14 2009 10/26/14 0032 10/27/14 0436  Weight: 85.594 kg (188 lb 11.2 oz) 83.099 kg (183 lb 3.2 oz) 85.748 kg (189 lb 0.6 oz)    History of present illness:  Please review dictated H and P from 10/16 for details. Briefly, 59 y.o. female with a history of DM2, HTN, and Hyperlipidemia who presents to the ED with complaints of fever, chillsc and cough for the past day prior to admit.At home her temperature was reported to be 103F. She was recently discharged from the hospital after an admission for DKA.Patient was admitted for concerns of possible sepsis  Hospital Course:  1. Questionable early HCAP with Presenting fevers, not septic 1. Unclear etiology for fevers 2. Blood and urine cx pending 3. Remained afebrile 4. Blood cx neg x 2 5. Patient was started on empiric vancomycin and zosyn 6. Patient to complete 2 more days of levaquin on discharge for empiric coverage for HCAP 2. Leukocytosis 1. WBC predominantly with eosinophils 2. Patient reports dry cough and itchy  eyes 3. Suspect leukocytosis related to allergic rhinitis. 4. Prescribed Claritin on d/c 3. DM2 1. Recently admitted for DKA 2. Glucose currently stable 4. Chronic diastolic CHF 1. Clinically euvolemic 5. HLD 1. Not on statin, but would cont to hold for now given elevated LFT's (see below) 2. LDL of 52 6. Elevated LFT's 1. Unclear etiology 2. Pt is now s/p recent abd [place] and ct abd/pelvis notable only for diffuse fatty infiltration of the liver 3. Slight trend downwards 4. Recommend follow up LFT's as outpatient, possible GI referral as outpatient 7. DVT prophylaxis 1. Lovenox subQ while admitted 8. 7mm R lower lung nodule 1. Noted incidentally on imaging 2. Recommendations for repeat CT chest in 6-12 months  Discharge Exam: Filed Vitals:   10/26/14 2351 10/27/14 0436 10/27/14 0855 10/27/14 1209  BP: 127/59  125/66 112/71  Pulse: 94  98 88  Temp: 98.9 F (37.2 C)  97.8 F (36.6 C) 98.3 F (36.8 C)  TempSrc: Oral  Oral Oral  Resp: 20  18 18   Height:      Weight:  85.748 kg (189 lb 0.6 oz)    SpO2: 94%  95% 92%    General: Awake, in nad Cardiovascular: regular, s1, s2 Respiratory: normal resp effort, no wheezing  Discharge Instructions     Medication List    STOP taking these medications        diazepam 5 MG tablet  Commonly known as:  VALIUM      TAKE these medications        aspirin 81 MG tablet  Take 81 mg by mouth daily.  BYDUREON 2 MG Susr  Generic drug:  Exenatide ER  Inject 2 mg into the skin once a week. Saturday     levofloxacin 500 MG tablet  Commonly known as:  LEVAQUIN  Take 1 tablet (500 mg total) by mouth daily.     lisinopril-hydrochlorothiazide 20-12.5 MG tablet  Commonly known as:  PRINZIDE,ZESTORETIC  Take 1 tablet by mouth daily.     loratadine-pseudoephedrine 10-240 MG 24 hr tablet  Commonly known as:  CLARITIN-D 24-hour  Take 1 tablet by mouth daily.     metFORMIN 500 MG tablet  Commonly known as:  GLUCOPHAGE  Take  1,000 mg by mouth 2 (two) times daily with a meal.     metoprolol tartrate 25 MG tablet  Commonly known as:  LOPRESSOR  Take 0.5 tablets (12.5 mg total) by mouth 2 (two) times daily.     ONE-A-DAY WOMENS PO  Take 1 tablet by mouth daily.     potassium chloride SA 20 MEQ tablet  Commonly known as:  K-DUR,KLOR-CON  Take 1 tablet (20 mEq total) by mouth daily.     sitaGLIPtin 100 MG tablet  Commonly known as:  JANUVIA  Take 100 mg by mouth daily.       Allergies  Allergen Reactions  . Actos [Pioglitazone] Other (See Comments)    Headache and myalgia  . Statins Nausea Only   Follow-up Information    Follow up with Lupe Carney, MD In 1 week.   Specialty:  Family Medicine   Why:  The office will send a message to the MD then call the pt with an appointment. Spoke with Terrence Dupont information:   [address] [place] [address] [phone]        The results of significant diagnostics from this hospitalization (including imaging, microbiology, ancillary and laboratory) are listed below for reference.    Significant Diagnostic Studies: Dg Chest 2 View  10/25/2014  CLINICAL DATA:  Fever and shortness of breath. History of hypertension. EXAM: CHEST  2 VIEW COMPARISON:  10/22/2014 FINDINGS: The heart size and mediastinal contours are within normal limits. Both lungs are clear. No pleural effusion or pneumothorax. Bony thorax is intact. IMPRESSION: No active cardiopulmonary disease. Electronically Signed   By: Amie Portland M.D.   On: 10/25/2014 21:25   Dg Chest 2 View  10/20/2014  CLINICAL DATA:  UTI. EXAM: CHEST  2 VIEW COMPARISON:  None. FINDINGS: Mild cardiomegaly. No pulmonary venous congestion. No focal pulmonary infiltrate. No pleural effusion or pneumothorax . IMPRESSION: 1. Mild cardiomegaly.  No pulmonary venous congestion. 2. No acute pulmonary disease. Electronically Signed   By: Maisie Fus  Register   On: 10/20/2014 09:08   Ct Angio Chest Pe W/cm  &/or Wo Cm  10/25/2014  CLINICAL DATA:  Dyspnea and fever. EXAM: CT ANGIOGRAPHY CHEST CT ABDOMEN AND PELVIS WITH CONTRAST TECHNIQUE: Multidetector CT imaging of the chest was performed using the standard protocol during bolus administration of intravenous contrast. Multiplanar CT image reconstructions and MIPs were obtained to evaluate the vascular anatomy. Multidetector CT imaging of the abdomen and pelvis was performed using the standard protocol during bolus administration of intravenous contrast. CONTRAST:  OMNIPAQUE IOHEXOL 350 MG/ML SOLN COMPARISON:  None. FINDINGS: CTA CHEST FINDINGS Technically adequate study with good opacification of the central and segmental pulmonary arteries. No focal filling defects demonstrated. No evidence of significant pulmonary embolus. Normal heart size. Normal caliber thoracic aorta. No evidence of aortic dissection. Calcification of the aorta. Murphy [employer]  vessel origins are patent. Mediastinal lymph nodes are not pathologically enlarged. Esophagus is decompressed. Evaluation of lungs is limited due to respiratory motion artifact. There is atelectasis in the lung bases and posterior lungs. There is a 7 mm nodule in the superior segment of the right lower lung. If the patient is at high risk for bronchogenic carcinoma, follow-up chest CT at 3-6 months is recommended. If the patient is at low risk for bronchogenic carcinoma, follow-up chest CT at 6-12 months is recommended. This recommendation follows the consensus statement: Guidelines for Management of Small Pulmonary Nodules Detected on CT Scans: A Statement from the Fleischner Society as published in Radiology 2005; 237:395-400. No focal consolidation. Airways appear patent. No pleural effusion. No pneumothorax. Degenerative changes in the thoracic spine. No destructive bone lesions. CT ABDOMEN and PELVIS FINDINGS Mild diffuse fatty infiltration of the liver. Calcification in the caudate lobe likely representing a granuloma.  No focal liver lesions otherwise demonstrated. Gallbladder, bile ducts, spleen, pancreas, adrenal glands, abdominal aorta, inferior vena cava, and retroperitoneal lymph nodes are unremarkable. Cyst in the midpole left kidney measuring 1.4 cm diameter. No hydronephrosis in either kidney. Renal nephrograms are symmetrical. Stomach, small bowel, and colon are not abnormally distended. Stool fills the colon. No free air or free fluid in the abdomen. Abdominal wall musculature appears intact. Infiltration in the anterior abdominal wall subcutaneous fat consistent with injection sites. Pelvis: The appendix is normal. Uterus and ovaries are not enlarged. Bladder wall is not thickened. No free or loculated pelvic fluid collections. No pelvic mass or lymphadenopathy. Mild degenerative changes in the lumbar spine. No destructive bone lesions. Review of the MIP images confirms the above findings. IMPRESSION: No evidence of significant pulmonary embolus. Dependent atelectasis in the lungs. 7 mm nodule in the right lower lung. See above followup recommendations. No acute process demonstrated in the abdomen or pelvis. Electronically Signed   By: Burman Nieves M.D.   On: 10/25/2014 23:16   US Abdomen Complete  10/20/2014  CLINICAL DATA:  Elevated bilirubin level EXAM: ULTRASOUND ABDOMEN COMPLETE COMPARISON:  None. FINDINGS: Gallbladder: No gallstones or wall thickening visualized. No sonographic Murphy sign noted. The gallbladder is contracted. Common bile duct: Diameter: 4 mm. Liver: Diffusely increased echogenicity without focal mass. IVC: No abnormality visualized. Pancreas: Visualized portion unremarkable. Spleen: Size and appearance within normal limits. Right Kidney: Length: 12.2 cm. Echogenicity within normal limits. No mass or hydronephrosis visualized. Left Kidney: Length: 11.5 cm. Echogenicity within normal limits. No mass or hydronephrosis visualized. 1.8 cm benign cyst. Abdominal aorta: No aneurysm visualized.  Other findings: None. IMPRESSION: Diffuse hepatic steatosis.  Benign cyst in the left kidney. Electronically Signed   By: Jolaine Click M.D.   On: 10/20/2014 16:04   Ct Abdomen Pelvis W Contrast  10/25/2014  CLINICAL DATA:  Dyspnea and fever. EXAM: CT ANGIOGRAPHY CHEST CT ABDOMEN AND PELVIS WITH CONTRAST TECHNIQUE: Multidetector CT imaging of the chest was performed using the standard protocol during bolus administration of intravenous contrast. Multiplanar CT image reconstructions and MIPs were obtained to evaluate the vascular anatomy. Multidetector CT imaging of the abdomen and pelvis was performed using the standard protocol during bolus administration of intravenous contrast. CONTRAST:  OMNIPAQUE IOHEXOL 350 MG/ML SOLN COMPARISON:  None. FINDINGS: CTA CHEST FINDINGS Technically adequate study with good opacification of the central and segmental pulmonary arteries. No focal filling defects demonstrated. No evidence of significant pulmonary embolus. Normal heart size. Normal caliber thoracic aorta. No evidence of aortic dissection. Calcification of the aorta. Murphy [employer]  vessel origins are patent. Mediastinal lymph nodes are not pathologically enlarged. Esophagus is decompressed. Evaluation of lungs is limited due to respiratory motion artifact. There is atelectasis in the lung bases and posterior lungs. There is a 7 mm nodule in the superior segment of the right lower lung. If the patient is at high risk for bronchogenic carcinoma, follow-up chest CT at 3-6 months is recommended. If the patient is at low risk for bronchogenic carcinoma, follow-up chest CT at 6-12 months is recommended. This recommendation follows the consensus statement: Guidelines for Management of Small Pulmonary Nodules Detected on CT Scans: A Statement from the Fleischner Society as published in Radiology 2005; 237:395-400. No focal consolidation. Airways appear patent. No pleural effusion. No pneumothorax. Degenerative changes in the  thoracic spine. No destructive bone lesions. CT ABDOMEN and PELVIS FINDINGS Mild diffuse fatty infiltration of the liver. Calcification in the caudate lobe likely representing a granuloma. No focal liver lesions otherwise demonstrated. Gallbladder, bile ducts, spleen, pancreas, adrenal glands, abdominal aorta, inferior vena cava, and retroperitoneal lymph nodes are unremarkable. Cyst in the midpole left kidney measuring 1.4 cm diameter. No hydronephrosis in either kidney. Renal nephrograms are symmetrical. Stomach, small bowel, and colon are not abnormally distended. Stool fills the colon. No free air or free fluid in the abdomen. Abdominal wall musculature appears intact. Infiltration in the anterior abdominal wall subcutaneous fat consistent with injection sites. Pelvis: The appendix is normal. Uterus and ovaries are not enlarged. Bladder wall is not thickened. No free or loculated pelvic fluid collections. No pelvic mass or lymphadenopathy. Mild degenerative changes in the lumbar spine. No destructive bone lesions. Review of the MIP images confirms the above findings. IMPRESSION: No evidence of significant pulmonary embolus. Dependent atelectasis in the lungs. 7 mm nodule in the right lower lung. See above followup recommendations. No acute process demonstrated in the abdomen or pelvis. Electronically Signed   By: Burman NievesWilliam  Stevens M.D.   On: 10/25/2014 23:16   Nm Pulmonary Perf And Vent  10/20/2014  CLINICAL DATA:  Shortness of breath and difficulty breathing since 10/28/2014. Evaluate for pulmonary embolism. EXAM: NUCLEAR MEDICINE VENTILATION - PERFUSION LUNG SCAN TECHNIQUE: Ventilation images were obtained in multiple projections using inhaled aerosol Tc-9368m DTPA. Perfusion images were obtained in multiple projections after intravenous injection of Tc-7468m MAA. RADIOPHARMACEUTICALS:  30.4 Technetium-6168m DTPA aerosol inhalation and 4.0 Technetium-9668m MAA IV COMPARISON:  Chest radiograph-earlier same day  FINDINGS: Review of chest radiograph performed earlier same day demonstrates borderline enlargement of the cardiac silhouette and mediastinal contours. Opacities overlying the bilateral lower lungs are favored to represent overlying breast tissues and abdominal pannus. No discrete focal airspace opacities. No definite pleural effusion or pneumothorax. No evidence of edema. Ventilation: There is relative distribution of inhaled radiotracer with minimal clumping about the bilateral pulmonary hila. A minimal amount of radiotracer stress and a minimal amount of ingested radiotracer is seen within the esophagus. Perfusion: There is relative homogeneous distribution of injected radiotracer without discrete segmental or subsegmental mismatched filling defect to suggest pulmonary embolism. IMPRESSION: Pulmonary embolism absent (very low probability of pulmonary embolism). Electronically Signed   By: Simonne ComeJohn  Watts M.D.   On: 10/20/2014 13:45   Dg Chest Port 1 View  10/22/2014  CLINICAL DATA:  Evaluate for fluid in the lungs, shortness of Breath EXAM: PORTABLE CHEST 1 VIEW COMPARISON:  10/20/2014 FINDINGS: Cardiomediastinal silhouette is stable. No acute infiltrate or pleural effusion. No pulmonary edema. Mild basilar atelectasis. IMPRESSION: No active disease. Electronically Signed   By: Natasha MeadLiviu  Pop  M.D.   On: 10/22/2014 11:59    Microbiology: Recent Results (from the past 240 hour(s))  Urine culture     Status: None   Collection Time: 10/20/14 10:10 AM  Result Value Ref Range Status   Specimen Description URINE, RANDOM  Final   Special Requests NONE  Final   Culture NO GROWTH 1 DAY  Final   Report Status 10/21/2014 FINAL  Final  Blood culture (routine x 2)     Status: None   Collection Time: 10/20/14 10:45 AM  Result Value Ref Range Status   Specimen Description BLOOD LEFT FOREARM  Final   Special Requests BOTTLES DRAWN AEROBIC AND ANAEROBIC  Final   Culture NO GROWTH 5 DAYS  Final   Report Status  10/25/2014 FINAL  Final  Blood culture (routine x 2)     Status: None   Collection Time: 10/20/14 11:10 AM  Result Value Ref Range Status   Specimen Description BLOOD LEFT ARM  Final   Special Requests BOTTLES DRAWN AEROBIC AND ANAEROBIC  Final   Culture NO GROWTH 5 DAYS  Final   Report Status 10/25/2014 FINAL  Final  MRSA PCR Screening     Status: None   Collection Time: 10/20/14  7:07 PM  Result Value Ref Range Status   MRSA by PCR NEGATIVE NEGATIVE Final    Comment:        The GeneXpert MRSA Assay (FDA approved for NASAL specimens only), is one component of a comprehensive MRSA colonization surveillance program. It is not intended to diagnose MRSA infection nor to guide or monitor treatment for MRSA infections.   MRSA PCR Screening     Status: None   Collection Time: 10/21/14 10:45 PM  Result Value Ref Range Status   MRSA by PCR NEGATIVE NEGATIVE Final    Comment:        The GeneXpert MRSA Assay (FDA approved for NASAL specimens only), is one component of a comprehensive MRSA colonization surveillance program. It is not intended to diagnose MRSA infection nor to guide or monitor treatment for MRSA infections.   Culture, blood (routine x 2)     Status: None (Preliminary result)   Collection Time: 10/25/14  7:51 PM  Result Value Ref Range Status   Specimen Description BLOOD RIGHT ARM  Final   Special Requests BOTTLES DRAWN AEROBIC AND ANAEROBIC 10CC   Final   Culture NO GROWTH 2 DAYS  Final   Report Status PENDING  Incomplete  Culture, blood (routine x 2)     Status: None (Preliminary result)   Collection Time: 10/25/14  8:01 PM  Result Value Ref Range Status   Specimen Description BLOOD RIGHT HAND  Final   Special Requests BOTTLES DRAWN AEROBIC ONLY 10CC  Final   Culture NO GROWTH 2 DAYS  Final   Report Status PENDING  Incomplete  Urine culture     Status: None (Preliminary result)   Collection Time: 10/25/14  8:09 PM  Result Value Ref Range Status    Specimen Description URINE, CLEAN CATCH  Final   Special Requests NONE  Final   Culture   Final    40,000 COLONIES/ml STAPHYLOCOCCUS SPECIES (COAGULASE NEGATIVE)   Report Status PENDING  Incomplete     Labs: Basic Metabolic Panel:  Recent Labs Lab 10/23/14 0314 10/24/14 0338 10/25/14 2006 10/26/14 0440 10/27/14 0247  NA 143 142 136 137 143  K 2.8* 3.0* 3.5 3.3* 3.8  CL 97* 99* 93* 98* 103  CO2 29 33*  29 29 31   GLUCOSE 137* 159* 157* 145* 171*  BUN 7 6 5* <5* 6  CREATININE 0.88 0.64 0.83 0.82 0.89  CALCIUM 8.7* 8.7* 9.2 8.3* 8.5*  MG  --  2.1  --   --   --    Liver Function Tests:  Recent Labs Lab 10/21/14 0417 10/23/14 0314 10/24/14 0338 10/25/14 2006 10/27/14 0247  AST 179* 75* 63* 136* 107*  ALT 222* 27 9* 20 47  ALKPHOS 410* 450* 429* 496* 426*  BILITOT 2.2* 1.8* 1.3* 1.8* 1.2  PROT 6.3* 6.9 6.6 7.3 6.2*  ALBUMIN 2.4* 2.3* 2.4* 2.9* 2.5*    Recent Labs Lab 10/23/14 0314 10/24/14 0338  LIPASE 86* 88*   No results for input(s): AMMONIA in the last 168 hours. CBC:  Recent Labs Lab 10/23/14 0314 10/24/14 0338 10/25/14 2006 10/26/14 0440 10/27/14 0247  WBC 14.8* 14.5* 13.2* 13.7* 13.4*  NEUTROABS  --  5.4 7.0  --  5.6  HGB 11.9* 11.8* 12.6 11.0* 10.0*  HCT 37.4 37.1 38.8 35.7* 32.2*  MCV 76.0* 76.5* 75.8* 77.1* 78.9  PLT 230 250 232 215 200   Cardiac Enzymes:  Recent Labs Lab 10/20/14 2241 10/21/14 1353 10/21/14 1550 10/21/14 2300  TROPONINI 0.31* 0.29* 0.20* 0.17*   BNP: BNP (last 3 results) No results for input(s): BNP in the last 8760 hours.  ProBNP (last 3 results) No results for input(s): PROBNP in the last 8760 hours.  CBG:  Recent Labs Lab 10/25/14 2016 10/26/14 0627 10/26/14 2123 10/27/14 0617 10/27/14 1123  GLUCAP 147* 104* 181* 114* 158*       Signed:  CHIU, STEPHEN K  [facility] 10/27/2014, 5:42 PM

## 2014-10-27 NOTE — Consult Note (Signed)
   [facility] CM Inpatient Consult   10/27/2014  [name] L Epple [birth year] [id number]   Came to bedside to speak with patient as she has been active with [employer] [facility]ness program for [employer] employees/dependents with [facility] [employer] [facility]. However, she was discharged just prior to writer's visit.   Raiford NobleAtika Eaden Hettinger, MSN-Ed, RN,BSN [facility] Care Management Hospital Liaison [phone]

## 2014-10-27 NOTE — Progress Notes (Signed)
Discharge instructions given. Pt verbalized understanding and all questions were answered.  

## 2014-10-27 NOTE — Clinical Documentation Improvement (Signed)
Internal Medicine  Abnormal Lab/Test Results:   Component      Potassium  Latest Ref Rng      3.5 - 5.1 mmol/L  10/25/2014     8:06 PM 3.5  10/26/2014     4:40 AM 3.3 (L)  10/27/2014      3.8    Possible Clinical Conditions associated with below indicators  Hypokalemia  Other Condition  Cannot Clinically Determine  Treatment Provided: Potassium chloride SA 20 mEq daily, and 60mEq ordered once on 10/25/2013   Please exercise your independent, professional judgment when responding. A specific answer is not anticipated or expected. Please update your documentation within the medical record to reflect your response to this query.   Thank you, Doy MinceVangela Moroni Nester, RN [phone] Clinical Documentation Specialist

## 2014-10-28 ENCOUNTER — Other Ambulatory Visit: Payer: Self-pay | Admitting: *Deleted

## 2014-10-28 LAB — URINE CULTURE: Culture: 40000

## 2014-10-28 NOTE — Patient Outreach (Signed)
Attempted to reach Brittany [name] for transition of care call as she was admitted to the hospital on 10/15 and discharged on 10/17. Left message on her home phone that this RNCM will try her on mobile phone also. Called patient's mobile number and patient's husband answered, he said he was not at home but that Brittany [name] was doing much better. Advised husband to tell Brittany [name] this RNCM will call her again tomorrow. Bary RichardJanet S. Hauser RN,CCM,CDE [facility] Care Management Coordinator Link To Wellness Office Phone [phone] Office Fax [phone]

## 2014-10-29 ENCOUNTER — Other Ambulatory Visit: Payer: Self-pay | Admitting: *Deleted

## 2014-10-29 NOTE — Patient Outreach (Addendum)
Spoke with Brittany [name] via home phone to completed transition of care call. Was recently in the hospital from 10/10-10/14, readmitted on 10/15- 10/17 for fever, shortness of breath, and hyperglycemia with blood sugars ranging from 104-230 Brittany [name] states she feels much better , denies fever, dyspnea, shortness of breath, says she is fatigued but feels stronger each day. She say her blood sugars yesterday were 266, 206, 384 (after ice cream), this morning her fasting blood sugar was 199. She is taking all of her DM medications except Invokana which was stopped during and after her hospitalizations. She was not weighing herself as directed for heart failure patients so reviewed discharge instructions with Brittany [name]. She will see Dr. Clovis RileyMitchell in follow up on 11/24 at 3:00 pm and to discuss timing of return to work. Requested that Brittany [name] call this [facility] after she sees Dr. Clovis RileyMitchell on 10/24 so that her [facility] To Well[facility] follow up visit can be rescheduled from 10/30/14. Bary RichardJanet S. Hauser RN,CCM,CDE [facility] Care Management Coordinator Link To Wellness Office Phone [phone] Office Fax [phone]

## 2014-10-30 ENCOUNTER — Ambulatory Visit: Payer: 59 | Admitting: *Deleted

## 2014-10-30 LAB — CULTURE, BLOOD (ROUTINE X 2)
Culture: NO GROWTH
Culture: NO GROWTH

## 2014-10-31 ENCOUNTER — Other Ambulatory Visit: Payer: Self-pay | Admitting: *Deleted

## 2014-10-31 DIAGNOSIS — R509 Fever, unspecified: Secondary | ICD-10-CM | POA: Insufficient documentation

## 2014-10-31 NOTE — Patient Outreach (Signed)
Brittany [name] called this RNCM, states she is now having diarrhea that started early this morning and is asking whether she should call Dr. Clovis RileyMitchell. Advised her to call MD office and let them know she was recently discharged from the hospital, was on antibiotics and may need to have stool specimen checked for clostridium difficile. Brittany RichardJanet S. Hauser RN,CCM,CDE [facility] Care Management Coordinator [facility] To [facility] Office Phone [phone] Office Fax [phone]

## 2014-11-06 ENCOUNTER — Ambulatory Visit: Payer: 59 | Admitting: *Deleted

## 2014-11-07 ENCOUNTER — Ambulatory Visit: Admit: 2014-11-07 | Discharge: 2014-11-07 | Disposition: A | Discharge status: Home / Self Care | Payer: 59

## 2014-11-07 DIAGNOSIS — Z1231 Encounter for screening mammogram for malignant neoplasm of breast: Secondary | ICD-10-CM

## 2014-11-25 ENCOUNTER — Encounter: Payer: Self-pay | Admitting: *Deleted

## 2014-11-25 ENCOUNTER — Other Ambulatory Visit: Payer: Self-pay | Admitting: *Deleted

## 2014-11-26 ENCOUNTER — Other Ambulatory Visit: Payer: Self-pay | Admitting: *Deleted

## 2014-11-26 NOTE — Patient Outreach (Signed)
Received call from Dr. Quita SkyeMitchell's office and advised that patient is to continue on both the metoprolol and potassium in addition to her Lisinopril/HCTZ. Left message on Osie's home phone advising her of this. Bary RichardJanet S. Hauser RN,CCM,CDE [facility] Care Management Coordinator [facility] To Wellness Office Phone [phone] Office Fax [phone]

## 2014-11-26 NOTE — Patient Outreach (Signed)
[facility]) Care Management   11/25/2014  Brittany [name] 1955/06/04 [id number]  Brittany [name] is an 59 y.o. female who presents to the [facility] Care Management office for routine Link To Wellness follow up for self management assistance with Type II DM, HTN and hyperlipidemia.  Subjective:  Brittany [name] says she has fully recovered form her 2 hospitalizations in October. Says she will see Dr. Clovis Riley again in follow up on 12/11/14. She reports self monitoring of her blood sugar twice daily,  In the morning and either before or after dinner. She reports the variance as 138-207. She says she has only been taking the metoprolol for her HTN and not the Lisinopril/HCTZ because she didn't think she was supposed to take both. She says she will take the last dose of Metoprolol and potassium today and does not know what to do about refills. She is pleased that she lost weight while she was sick and hopes she does not regain it.  Objective:   Review of Systems  Constitutional: Negative.     Physical Exam  Constitutional: She is oriented to person, place, and time. She appears well-developed and well-nourished.  Cardiovascular: Normal rate and regular rhythm.   Respiratory: Effort normal.  Neurological: She is alert and oriented to person, place, and time.  Skin: Skin is warm and dry.  Psychiatric: She has a normal mood and affect. Her behavior is normal. Judgment and thought content normal.   Filed Vitals:   11/25/14 1543  BP: 140/90  Pulse: 72   Filed Weights   11/25/14 1543  Weight: 189 lb 6.4 oz (85.911 kg)   Current Medications:   Current Outpatient Prescriptions  Medication Sig Dispense Refill  . aspirin 81 MG tablet Take 81 mg by mouth daily.    . Exenatide ER (BYDUREON) 2 MG SUSR Inject 2 mg into the skin once a week. Saturday    . metFORMIN (GLUCOPHAGE) 500 MG tablet Take 1,000 mg by mouth 2 (two) times daily with a meal.    . metoprolol  tartrate (LOPRESSOR) 25 MG tablet Take 0.5 tablets (12.5 mg total) by mouth 2 (two) times daily. 60 tablet 0  . Multiple Vitamins-Calcium (ONE-A-DAY WOMENS PO) Take 1 tablet by mouth daily.    . potassium chloride SA (K-DUR,KLOR-CON) 20 MEQ tablet Take 1 tablet (20 mEq total) by mouth daily. 30 tablet 0  . sitaGLIPtin (JANUVIA) 100 MG tablet Take 100 mg by mouth daily.    [name] lisinopril-hydrochlorothiazide (PRINZIDE,ZESTORETIC) 20-12.5 MG per tablet Take 1 tablet by mouth daily.     No current facility-administered medications for this visit.    Functional Status:   In your present state of health, do you have any difficulty performing the following activities: 11/25/2014 10/26/2014  Hearing? N N  Vision? N N  Difficulty concentrating or making decisions? N N  Walking or climbing stairs? N N  Dressing or bathing? N N  Doing errands, shopping? N N  Preparing Food and eating ? N -  Using the Toilet? N -  In the past six months, have you accidently leaked urine? N -  Do you have problems with loss of bowel control? N -  Managing your Medications? N -  Managing your Finances? N -  Housekeeping or managing your Housekeeping? N -    Fall/Depression Screening:    PHQ 2/9 Scores 05/23/2014 01/23/2014  PHQ - 2 Score 0 0    Assessment:   [employer] employee and Link  To Wellness member with Type II DM,  HTN and hyperlipidemia with Hgb A1C= 7.7% on 08/07/14, low HDL of 18 on 10/27/14 and BP readings slightly elevated today.  [facility]        Most Recent Value   Care Plan Problem One  Type II DM,  not meeting target A1C 7680f <7.0% as evidenced by POC A1C= 7.7% on 08/07/14   Role Documenting the Problem One  Care Management Coordinator   Care Plan for Problem One  Active   THN Long Term Goal (31-90 days)  Improved glycemic control as evidenced by A1C<7.5% at next check   [facility] Start Date  11/25/14   Interventions for Problem One Long Term Goal  discussed  patient's recent hospitalizations and reviewed bloodwork and vitals done while hospitalized, assessed patient's present state of recovery, briefly reviewed the pathophysiologic deficits in Type II diabetes, discussed physiology of diabetes as a chronic progressive disease, reviewed blood sugar readings and targets, reviewed patient medications, discussed DM medications of Januvia, Metformin and Bydureon ,including the mechanism of action, common side effects, dosages and dosing schedule, reinforced importance of taking all medications as prescribed, discussed the discrepancy in how Brittany [name] is taking her BP medications since hospital discharge, advised her this RNCM will contact Dr. Clovis RileyMitchell for direction, congratulated Brittany [name] on resuming her low dose aspirin as prescribed, reviewed CHO controlled meal planning and strategies to maintain weight loss that occurred while she was sick, discussed effects of physical activity on glucose levels and long-term glucose control by improving insulin sensitivity and assisting with weight management and cardiovascular health and lipid levels, encouraged patient to resume walking now that she is fully recovered, reviewed upcoming appointments with  primary care MD on 12/11/14 with labs and reinforced the importance of keeping the appointments, will arrange for quarterly Link To Wellness follow after she sees Dr. Clovis RileyMitchell in December                       RNCM to fax today's office visit note to Dr. Clovis RileyMitchell and ask for direction on BP medications. RNCM will meet quarterly and as needed with patient per Link To Wellness program guidelines to assist with Type II DM, HTN and hyperlipidemia self-management and assess patient's progress toward mutually set goals  Bary RichardJanet S. Hauser RN,CCM,CDE [facility] Care Management Coordinator Link To Wellness Office Phone [phone] Office Fax [phone]

## 2014-11-26 NOTE — Patient Outreach (Signed)
Left message at Dr. Quita SkyeMitchell's office requesting clarification of medications that [name] is to take for her HTN. Await return call.  Bary RichardJanet S. Hauser RN,CCM,CDE [facility] Care Management Coordinator Link To Wellness Office Phone [phone] Office Fax [phone]

## 2015-01-14 MED FILL — PROMETHAZINE-CODEINE SYRUP: 6.25-10 | 6 days supply | Qty: 120 | Fill #0

## 2015-01-19 MED FILL — JANUVIA 100 MG TABLET: 100 | 90 days supply | Qty: 90 | Fill #1

## 2015-01-29 MED FILL — BYDUREON 2 MG VIAL: 2 | 28 days supply | Qty: 4 | Fill #5

## 2015-02-06 MED FILL — LISINOPRIL-HCTZ 20-12.5 MG: 20-12.5 | 90 days supply | Qty: 90 | Fill #1

## 2015-02-11 DIAGNOSIS — Z7984 Long term (current) use of oral hypoglycemic drugs: Secondary | ICD-10-CM | POA: Diagnosis not present

## 2015-02-11 DIAGNOSIS — R0982 Postnasal drip: Secondary | ICD-10-CM | POA: Diagnosis not present

## 2015-02-11 DIAGNOSIS — I10 Essential (primary) hypertension: Secondary | ICD-10-CM | POA: Diagnosis not present

## 2015-02-11 DIAGNOSIS — E119 Type 2 diabetes mellitus without complications: Secondary | ICD-10-CM | POA: Diagnosis not present

## 2015-02-11 DIAGNOSIS — E7800 Pure hypercholesterolemia, unspecified: Secondary | ICD-10-CM | POA: Diagnosis not present

## 2015-02-25 MED FILL — TRUE METRIX GLUCOSE TEST ST: 90 days supply | Qty: 200 | Fill #1

## 2015-02-25 MED FILL — metFORMIN HCL 500 MG TABS: 500 | 90 days supply | Qty: 360 | Fill #1

## 2015-02-25 MED FILL — BYDUREON 2 MG VIAL: 2 | 28 days supply | Qty: 4 | Fill #0

## 2015-03-02 MED FILL — METOPROLOL TARTRATE 25 MG T: 25 | 90 days supply | Qty: 90 | Fill #1

## 2015-03-25 MED FILL — BYDUREON 2 MG VIAL: 2 | 28 days supply | Qty: 4 | Fill #1

## 2015-04-10 DIAGNOSIS — L509 Urticaria, unspecified: Secondary | ICD-10-CM | POA: Diagnosis not present

## 2015-04-17 MED FILL — JANUVIA 100 MG TABLET: 100 | 90 days supply | Qty: 90 | Fill #0

## 2015-04-17 MED FILL — BYDUREON 2 MG VIAL: 2 | 28 days supply | Qty: 4 | Fill #2

## 2015-05-05 ENCOUNTER — Ambulatory Visit: Payer: 59 | Admitting: *Deleted

## 2015-05-05 DIAGNOSIS — E119 Type 2 diabetes mellitus without complications: Secondary | ICD-10-CM | POA: Diagnosis not present

## 2015-05-05 DIAGNOSIS — H2513 Age-related nuclear cataract, bilateral: Secondary | ICD-10-CM | POA: Diagnosis not present

## 2015-05-05 DIAGNOSIS — H538 Other visual disturbances: Secondary | ICD-10-CM | POA: Diagnosis not present

## 2015-05-07 ENCOUNTER — Ambulatory Visit: Payer: Self-pay | Admitting: *Deleted

## 2015-05-18 MED FILL — PROMETHAZINE-CODEINE SYRUP: 6.25-10 | 4 days supply | Qty: 120 | Fill #0

## 2015-05-18 MED FILL — BYDUREON 2 MG VIAL: 2 | 28 days supply | Qty: 4 | Fill #3

## 2015-05-18 MED FILL — metFORMIN HCL 500 MG TABS: 500 | 90 days supply | Qty: 360 | Fill #2

## 2015-05-18 MED FILL — LISINOPRIL-HCTZ 20-12.5 MG: 20-12.5 | 90 days supply | Qty: 90 | Fill #0

## 2015-05-18 MED FILL — METOPROLOL TARTRATE 25 MG T: 25 | 90 days supply | Qty: 90 | Fill #2

## 2015-06-18 MED FILL — BYDUREON 2 MG VIAL: 2 | 28 days supply | Qty: 4 | Fill #4

## 2015-07-10 MED FILL — JANUVIA 100 MG TABLET: 100 | 90 days supply | Qty: 90 | Fill #1

## 2015-07-10 MED FILL — BYDUREON 2 MG VIAL: 2 | 28 days supply | Qty: 4 | Fill #5

## 2015-07-10 MED FILL — TRUE METRIX GLUCOSE TEST ST: 90 days supply | Qty: 200 | Fill #2

## 2015-07-30 ENCOUNTER — Ambulatory Visit: Payer: Self-pay | Admitting: *Deleted

## 2015-08-03 ENCOUNTER — Ambulatory Visit: Payer: Self-pay | Admitting: *Deleted

## 2015-08-06 ENCOUNTER — Other Ambulatory Visit: Payer: Self-pay | Admitting: *Deleted

## 2015-08-06 VITALS — BP 118/82 | Ht 63.0 in | Wt 205.4 lb

## 2015-08-06 DIAGNOSIS — E119 Type 2 diabetes mellitus without complications: Secondary | ICD-10-CM

## 2015-08-09 NOTE — Patient Outreach (Signed)
[facility]) Care Management   08/06/2015  Brittany [name] 1955/05/02 [id number]  Brittany [name] is an 60 y.o. female who presents to the [facility] [employer] Care Management office for routine Link To Wellness follow up for self management assistance with Type II DM, HTN, hyperlipidemia and obesity.  Subjective: Brittany [name] says her husband was recently diagnosed with Type II DM . She would like to attend the Type II DM core classes with her husband so they can both get better control of her blood sugar. She says she continues to test her blood sugar twice daily and that her highest fasting was 150 and after supper 168. She last saw Dr Clovis Riley on 02/11/15 and had to be seen at the office on 3/1 for hives. She will see him again on 8/3.   Objective:   Review of Systems  Constitutional: Negative.     Physical Exam  Constitutional: She is oriented to person, place, and time. She appears well-developed and well-nourished.  Respiratory: Effort normal.  Neurological: She is alert and oriented to person, place, and time.  Skin: Skin is warm and dry.  Psychiatric: She has a normal mood and affect. Her behavior is normal. Judgment and thought content normal.   Vitals:   08/06/15 1505  BP: 118/82   Filed Weights   08/06/15 1505  Weight: 205 lb 6.4 oz (93.2 kg)    Encounter Medications:   Outpatient Encounter Prescriptions as of 08/06/2015  Medication Sig Note  . aspirin 81 MG tablet Take 81 mg by mouth daily.   . Exenatide ER (BYDUREON) 2 MG SUSR Inject 2 mg into the skin once a week. Saturday   . lisinopril-hydrochlorothiazide (PRINZIDE,ZESTORETIC) 20-12.5 MG per tablet Take 1 tablet by mouth daily.   [name] loratadine-pseudoephedrine (CLARITIN-D 24-HOUR) 10-240 MG 24 hr tablet Take 1 tablet by mouth daily. 08/06/2015: Takes prn  . metFORMIN (GLUCOPHAGE) 500 MG tablet Take 1,000 mg by mouth 2 (two) times daily with a meal.   . metoprolol tartrate (LOPRESSOR) 25  MG tablet Take 0.5 tablets (12.5 mg total) by mouth 2 (two) times daily.   . Multiple Vitamins-Calcium (ONE-A-DAY WOMENS PO) Take 1 tablet by mouth daily.   . sitaGLIPtin (JANUVIA) 100 MG tablet Take 100 mg by mouth daily.   [name] levofloxacin (LEVAQUIN) 500 MG tablet Take 1 tablet (500 mg total) by mouth daily. (Patient not taking: Reported on 11/25/2014)   . potassium chloride SA (K-DUR,KLOR-CON) 20 MEQ tablet Take 1 tablet (20 mEq total) by mouth daily. (Patient not taking: Reported on 08/06/2015)    No facility-administered encounter medications on file as of 08/06/2015.     Functional Status:   In your present state of health, do you have any difficulty performing the following activities: 11/25/2014 10/26/2014  Hearing? N N  Vision? N N  Difficulty concentrating or making decisions? N N  Walking or climbing stairs? N N  Dressing or bathing? N N  Doing errands, shopping? N N  Preparing Food and eating ? N -  Using the Toilet? N -  In the past six months, have you accidently leaked urine? N -  Do you have problems with loss of bowel control? N -  Managing your Medications? N -  Managing your Finances? N -  Housekeeping or managing your Housekeeping? N -  Some recent data might be hidden    Fall/Depression Screening:    PHQ 2/9 Scores 05/23/2014 01/23/2014  PHQ - 2 Score 0 0  Assessment:    employee and Link To Wellness member with Type II DM,  HTN and hyperlipidemia with Hgb A1C= 7.6% on 02/11/15, low HDL of 18 on 10/27/14 and BP meeting treatment targets.  Plan: [facility] CM Care Plan Problem One   Flowsheet Row Most Recent Value  Care Plan Problem One Type II DM,  not meeting target A1C of <7.0% as evidenced by POC A1C= 7.6% on 02/11/15, meeting treatment targets for HTN, obesity with weight gain since last visit  Role Documenting the Problem One  Care Management Coordinator  Care Plan for Problem One  Active  THN Long Term Goal (31-90 days) Improved glycemic control as  evidenced by A1C<7.0% at next check, ongoing good control of HTN and weight loss or no further weight gain  THN Long Term Goal Start Date  08/06/15  Interventions for Problem One Long Term Goal Discussed her husband's recent diagnosis of Type II DM, briefly reviewed the pathophysiologic deficits in Type II diabetes, discussed physiology of diabetes as a chronic progressive disease, reviewed blood sugar readings and targets, reviewed patient medications, discussed DM medications of Januvia, Metformin and Bydureon ,including the mechanism of action, common side effects, dosages and dosing schedule, reinforced importance of taking all medications as prescribed, at patient's request will send referral to the [facility] to enroll Brittany [name] and her husband in the Type II DM Core classes,  reviewed effects of physical activity on glucose levels and long-term glucose control by improving insulin sensitivity and assisting with weight management and cardiovascular health and lipid levels, reviewed upcoming appointments with  primary care MD on 08/13/15  and reinforced the importance of keeping the appointments, will arrange for quarterly Link To Wellness follow after she sees Dr. Clovis Riley in August                       RNCM to fax today's office visit note to Dr. Clovis Riley. RNCM will meet quarterly and as needed with patient per Link To Wellness program guidelines to assist with Type II DM, HTN, hyperlipidemia and obesity self-management and assess patient's progress toward mutually set goals.  Brittany Richard RN,CCM,CDE [facility] Care Management Coordinator Link To Wellness Office Phone [phone] Office Fax [phone]

## 2015-08-12 MED FILL — LISINOPRIL-HCTZ 20-12.5 MG: 20-12.5 | 90 days supply | Qty: 90 | Fill #1

## 2015-08-12 MED FILL — METOPROLOL TARTRATE 25 MG T: 25 | 90 days supply | Qty: 90 | Fill #3

## 2015-08-12 MED FILL — metFORMIN HCL 500 MG TABS: 500 | 90 days supply | Qty: 360 | Fill #3

## 2015-08-12 MED FILL — BYDUREON 2 MG VIAL: 2 | 28 days supply | Qty: 4 | Fill #0

## 2015-08-13 ENCOUNTER — Other Ambulatory Visit (HOSPITAL_COMMUNITY): Payer: Self-pay | Admitting: Family Medicine

## 2015-08-13 DIAGNOSIS — Z7984 Long term (current) use of oral hypoglycemic drugs: Secondary | ICD-10-CM | POA: Diagnosis not present

## 2015-08-13 DIAGNOSIS — E669 Obesity, unspecified: Secondary | ICD-10-CM | POA: Diagnosis not present

## 2015-08-13 DIAGNOSIS — Z794 Long term (current) use of insulin: Secondary | ICD-10-CM | POA: Diagnosis not present

## 2015-08-13 DIAGNOSIS — E119 Type 2 diabetes mellitus without complications: Secondary | ICD-10-CM | POA: Diagnosis not present

## 2015-08-13 DIAGNOSIS — E7800 Pure hypercholesterolemia, unspecified: Secondary | ICD-10-CM | POA: Diagnosis not present

## 2015-08-13 DIAGNOSIS — I10 Essential (primary) hypertension: Secondary | ICD-10-CM | POA: Diagnosis not present

## 2015-08-13 DIAGNOSIS — R911 Solitary pulmonary nodule: Secondary | ICD-10-CM | POA: Diagnosis not present

## 2015-08-19 ENCOUNTER — Ambulatory Visit (HOSPITAL_COMMUNITY)
Admission: RE | Admit: 2015-08-19 | Discharge: 2015-08-19 | Disposition: A | Discharge status: Home / Self Care | Payer: 59 | Source: Ambulatory Visit | Attending: Family Medicine | Admitting: Family Medicine

## 2015-08-19 ENCOUNTER — Encounter (HOSPITAL_COMMUNITY): Payer: Self-pay

## 2015-08-19 DIAGNOSIS — I700 Atherosclerosis of aorta: Secondary | ICD-10-CM | POA: Diagnosis not present

## 2015-08-19 DIAGNOSIS — R911 Solitary pulmonary nodule: Secondary | ICD-10-CM

## 2015-08-19 DIAGNOSIS — R918 Other nonspecific abnormal finding of lung field: Secondary | ICD-10-CM | POA: Insufficient documentation

## 2015-08-20 MED FILL — ATORVASTATIN 40 MG TABLET: 40 | 90 days supply | Qty: 90 | Fill #0

## 2015-08-25 ENCOUNTER — Encounter: Payer: 59 | Attending: Family Medicine

## 2015-08-25 DIAGNOSIS — E118 Type 2 diabetes mellitus with unspecified complications: Secondary | ICD-10-CM

## 2015-08-25 DIAGNOSIS — Z713 Dietary counseling and surveillance: Secondary | ICD-10-CM | POA: Diagnosis not present

## 2015-08-25 NOTE — Progress Notes (Signed)

## 2015-09-01 DIAGNOSIS — IMO0002 Reserved for concepts with insufficient information to code with codable children: Secondary | ICD-10-CM

## 2015-09-01 DIAGNOSIS — Z713 Dietary counseling and surveillance: Secondary | ICD-10-CM | POA: Diagnosis not present

## 2015-09-01 DIAGNOSIS — E118 Type 2 diabetes mellitus with unspecified complications: Principal | ICD-10-CM

## 2015-09-01 DIAGNOSIS — E1165 Type 2 diabetes mellitus with hyperglycemia: Secondary | ICD-10-CM

## 2015-09-01 NOTE — Progress Notes (Signed)

## 2015-09-08 ENCOUNTER — Encounter: Payer: 59 | Admitting: *Deleted

## 2015-09-08 DIAGNOSIS — Z713 Dietary counseling and surveillance: Secondary | ICD-10-CM | POA: Diagnosis not present

## 2015-09-08 DIAGNOSIS — E118 Type 2 diabetes mellitus with unspecified complications: Secondary | ICD-10-CM

## 2015-09-08 NOTE — Progress Notes (Signed)
Patient was seen on 09/08/2015 for the third of a series of three diabetes self-management courses at [facility].   Brittany [name]. State the amount of activity recommended for healthy living . Describe activities suitable for individual needs . Identify ways to regularly incorporate activity into daily life . Identify barriers to activity and ways to over come these barriers  Identify diabetes medications being personally used and their primary action for lowering glucose and possible side effects . Describe role of stress on blood glucose and develop strategies to address psychosocial issues . Identify diabetes complications and ways to prevent them  Explain how to manage diabetes during illness . Evaluate success in meeting personal goal . Establish 2-3 goals that they will plan to diligently work on until they return for the  5830-month follow-up visit  Goals:   I will count my carb choices at most meals and snacks  I will be active 20 minutes or more 2 times a week  Your patient has identified these potential barriers to change:  Motivation  Your patient has identified their diabetes self-care support plan as  THN Plan:  Attend Support Group as desired

## 2015-09-09 MED FILL — BYDUREON 2 MG VIAL: 2 | 28 days supply | Qty: 4 | Fill #0

## 2015-10-07 MED FILL — BYDUREON 2 MG VIAL: 2 | 28 days supply | Qty: 4 | Fill #1

## 2015-10-08 MED FILL — JANUVIA 100 MG TABLET: 100 | 90 days supply | Qty: 90 | Fill #0

## 2015-10-13 ENCOUNTER — Other Ambulatory Visit: Payer: Self-pay | Admitting: Family Medicine

## 2015-10-13 DIAGNOSIS — Z1231 Encounter for screening mammogram for malignant neoplasm of breast: Secondary | ICD-10-CM

## 2015-11-05 ENCOUNTER — Ambulatory Visit: Payer: Self-pay | Admitting: *Deleted

## 2015-11-09 ENCOUNTER — Ambulatory Visit
Admission: RE | Admit: 2015-11-09 | Discharge: 2015-11-09 | Disposition: A | Discharge status: Home / Self Care | Payer: 59 | Source: Ambulatory Visit | Attending: Family Medicine | Admitting: Family Medicine

## 2015-11-09 DIAGNOSIS — Z1231 Encounter for screening mammogram for malignant neoplasm of breast: Secondary | ICD-10-CM

## 2015-11-09 MED FILL — BYDUREON 2 MG VIAL: 2 | 28 days supply | Qty: 4 | Fill #2

## 2015-11-10 MED FILL — metFORMIN HCL 500 MG TABS: 500 | 90 days supply | Qty: 360 | Fill #0

## 2015-11-10 MED FILL — LISINOPRIL-HCTZ 20-12.5 MG: 20-12.5 | 90 days supply | Qty: 90 | Fill #0

## 2015-11-10 MED FILL — METOPROLOL TARTRATE 25 MG T: 25 | 90 days supply | Qty: 90 | Fill #0

## 2015-11-12 ENCOUNTER — Other Ambulatory Visit: Payer: Self-pay | Admitting: *Deleted

## 2015-11-12 NOTE — Patient Outreach (Signed)
[facility]) Care Management   11/12/2015  Brittany [name] [birth year] [id number]  Brittany Rummageerry L Brittany [name] is an 60 y.o. female [facility] Care [facility] office for routine Link To Wellness follow up for self management assistance with Type II DM, HTN, hyperlipidemia and obesity.  Subjective: Brittany [name] says she and her husband felt the Type II DM core classes they attended in August were very informative and have helped both improved their diabetes self management. She says she continues to test her blood sugar twice daily, in the morning and after supper. She reports her average blood sugar is 176. She last saw Dr Clovis RileyMitchell on 08/13/15 and her Hgb A1C was 8.0% and no medication changes were made.  Says she has questions about [employer], [place]'s new DM self management program.   Objective:   Review of Systems  Constitutional: Negative.     Physical Exam  Constitutional: She is oriented to person, place, and time. She appears well-developed and well-nourished.  Respiratory: Effort normal.  Neurological: She is alert and oriented to person, place, and time.  Skin: Skin is warm and dry.  Psychiatric: She has a normal mood and affect. Her behavior is normal. Judgment and thought content normal.   Vitals:   11/12/15 1515  BP: 110/76   Filed Weights   11/12/15 1515  Weight: 203 lb 6.4 oz (92.3 kg)    Encounter Medications:   Outpatient Encounter Prescriptions as of 11/12/2015  Medication Sig Note  . aspirin 81 MG tablet Take 81 mg by mouth daily.   . Exenatide ER (BYDUREON) 2 MG SUSR Inject 2 mg into the skin once a week. Saturday   . lisinopril-hydrochlorothiazide (PRINZIDE,ZESTORETIC) 20-12.5 MG per tablet Take 1 tablet by mouth daily.   [name]. loratadine-pseudoephedrine (CLARITIN-D 24-HOUR) 10-240 MG 24 hr tablet Take 1 tablet by mouth daily. 08/06/2015: Takes prn  . metFORMIN (GLUCOPHAGE) 500 MG tablet Take 1,000 mg by mouth 2  (two) times daily with a meal.   . metoprolol tartrate (LOPRESSOR) 25 MG tablet Take 0.5 tablets (12.5 mg total) by mouth 2 (two) times daily.   . Multiple Vitamins-Calcium (ONE-A-DAY WOMENS PO) Take 1 tablet by mouth daily.   . sitaGLIPtin (JANUVIA) 100 MG tablet Take 100 mg by mouth daily.   [name]. levofloxacin (LEVAQUIN) 500 MG tablet Take 1 tablet (500 mg total) by mouth daily. (Patient not taking: Reported on 11/12/2015)   . potassium chloride SA (K-DUR,KLOR-CON) 20 MEQ tablet Take 1 tablet (20 mEq total) by mouth daily. (Patient not taking: Reported on 11/12/2015)    No facility-administered encounter medications on file as of 11/12/2015.     Functional Status:   In your present state of health, do you have any difficulty performing the following activities: 11/12/2015 11/25/2014  Hearing? N N  Vision? N N  Difficulty concentrating or making decisions? N N  Walking or climbing stairs? N N  Dressing or bathing? N N  Doing errands, shopping? N N  Preparing Food and eating ? N N  Using the Toilet? N N  In the past six months, have you accidently leaked urine? N N  Do you have problems with loss of bowel control? N N  Managing your Medications? N N  Managing your Finances? N N  Housekeeping or managing your Housekeeping? N N  Some recent data might be hidden    Fall/Depression Screening:    PHQ 2/9 Scores 11/12/2015 09/01/2015 08/25/2015 05/23/2014 01/23/2014  PHQ -  2 Score 1 0 0 0 0    Assessment:   [employer] employee and Link To Wellness member with Type II DM,  HTN and hyperlipidemia with Hgb A1C= 8.0% on 08/13/15,  previously 7.6% on 02/11/15, elevated triglycerides of 224 on 08/13/15, BP meeting treatment targets.  [facility]   Flowsheet Row Most Recent Value  Care Plan Problem One Type II DM,  not meeting target A1C of <7.0% as evidenced by increased Hgb A1C= 8.0 on 08/13/15 was 7.6% on 02/11/15, meeting treatment targets for HTN, obesity with 2 lbs weight loss since  last visit  Role Documenting the Problem One  Care Management Coordinator  Care Plan for Problem One  Active  THN Long Term Goal (31-90 days) Improved glycemic control as evidenced by A1C<7.0% at next check, ongoing good control of HTN, improved lipid panel at next assessment and weight loss or no further weight gain at next check  [facility] Start Date  11/12/15  Interventions for Problem One Long Term Goal Reviewed blood sugar readings and targets, reviewed patient medications, discussed DM medications of Januvia, Metformin and Bydureon ,including the mechanism of action, common side effects, dosages and dosing schedule, reinforced importance of taking all medications as prescribed, reviewed new pharmacy formulary with Brittany [name] and explained that Bydureon will be a non preferred brand in 2018 and therefore she will incur a copay of approximately $115 per month or she has the option of switching to Victoza without a copay, advised Brittany [name] her primary care provider will be notified of the formulary change,  reviewed upcoming appointments with  primary care MD on 02/15/16  and reinforced the importance of keeping the appointments, enrolled Brittany [name] in [employer] and advised her she will be contacted regarding onboarding into the program in January 2018                      RNCM to fax today's office visit note to Dr. Clovis RileyMitchell.  Bary RichardJanet S. Hauser RN,CCM,CDE [facility] Care Management Coordinator Link To Wellness Office Phone [phone] Office Fax [phone]

## 2015-12-08 MED FILL — TRUE METRIX GLUCOSE TEST ST: 90 days supply | Qty: 200 | Fill #0

## 2015-12-08 MED FILL — BYDUREON 2 MG VIAL: 2 | 28 days supply | Qty: 4 | Fill #0

## 2016-01-12 MED FILL — JANUVIA 100 MG TABLET: 100 | 90 days supply | Qty: 90 | Fill #1

## 2016-01-13 MED FILL — BYDUREON 2 MG VIAL: 2 | 28 days supply | Qty: 4 | Fill #1

## 2016-02-15 ENCOUNTER — Other Ambulatory Visit: Payer: Self-pay | Admitting: Family Medicine

## 2016-02-15 ENCOUNTER — Other Ambulatory Visit (HOSPITAL_COMMUNITY)
Admission: RE | Admit: 2016-02-15 | Discharge: 2016-02-15 | Disposition: A | Discharge status: Home / Self Care | Payer: 59 | Source: Ambulatory Visit | Attending: Family Medicine | Admitting: Family Medicine

## 2016-02-15 DIAGNOSIS — Z6836 Body mass index (BMI) 36.0-36.9, adult: Secondary | ICD-10-CM | POA: Diagnosis not present

## 2016-02-15 DIAGNOSIS — R05 Cough: Secondary | ICD-10-CM | POA: Diagnosis not present

## 2016-02-15 DIAGNOSIS — I10 Essential (primary) hypertension: Secondary | ICD-10-CM | POA: Diagnosis not present

## 2016-02-15 DIAGNOSIS — E669 Obesity, unspecified: Secondary | ICD-10-CM | POA: Diagnosis not present

## 2016-02-15 DIAGNOSIS — E7800 Pure hypercholesterolemia, unspecified: Secondary | ICD-10-CM | POA: Diagnosis not present

## 2016-02-15 DIAGNOSIS — Z7984 Long term (current) use of oral hypoglycemic drugs: Secondary | ICD-10-CM | POA: Diagnosis not present

## 2016-02-15 DIAGNOSIS — Z124 Encounter for screening for malignant neoplasm of cervix: Secondary | ICD-10-CM | POA: Diagnosis not present

## 2016-02-15 DIAGNOSIS — Z0000 Encounter for general adult medical examination without abnormal findings: Secondary | ICD-10-CM | POA: Diagnosis not present

## 2016-02-15 DIAGNOSIS — E119 Type 2 diabetes mellitus without complications: Secondary | ICD-10-CM | POA: Diagnosis not present

## 2016-02-15 MED FILL — METOPROLOL TARTRATE 25 MG T: 25 | 90 days supply | Qty: 90 | Fill #1

## 2016-02-15 MED FILL — LISINOPRIL-HCTZ 20-12.5 MG: 20-12.5 | 90 days supply | Qty: 90 | Fill #1

## 2016-02-15 MED FILL — BYDUREON 2 MG VIAL: 2 | 28 days supply | Qty: 4 | Fill #2

## 2016-02-17 LAB — CYTOLOGY - PAP
Adequacy: ABSENT
Diagnosis: NEGATIVE

## 2016-02-22 MED FILL — metFORMIN HCL 500 MG TABS: 500 | 90 days supply | Qty: 360 | Fill #1

## 2016-02-26 MED FILL — JARDIANCE 10 MG TABLET: 10 | 30 days supply | Qty: 30 | Fill #0

## 2016-03-18 MED FILL — BYDUREON 2 MG VIAL: 2 | 28 days supply | Qty: 4 | Fill #3

## 2016-03-18 MED FILL — ACCU-CHEK FASTCLIX LANCETS: 30 days supply | Qty: 102 | Fill #0

## 2016-03-18 MED FILL — ACCU-CHEK GUIDE TEST STRIP: 30 days supply | Qty: 100 | Fill #0

## 2016-04-15 MED FILL — BYDUREON BCise 2 MG/0.85ML: 2 | 28 days supply | Qty: 1 | Fill #0

## 2016-04-15 MED FILL — JANUVIA 100 MG TABLET: 100 | 90 days supply | Qty: 90 | Fill #2

## 2016-04-21 MED FILL — ACCU-CHEK GUIDE TEST STRIP: 30 days supply | Qty: 100 | Fill #1

## 2016-05-03 DIAGNOSIS — H538 Other visual disturbances: Secondary | ICD-10-CM | POA: Diagnosis not present

## 2016-05-03 DIAGNOSIS — E119 Type 2 diabetes mellitus without complications: Secondary | ICD-10-CM | POA: Diagnosis not present

## 2016-05-03 DIAGNOSIS — H2513 Age-related nuclear cataract, bilateral: Secondary | ICD-10-CM | POA: Diagnosis not present

## 2016-05-16 MED FILL — metFORMIN HCL 500 MG TABS: 500 | 90 days supply | Qty: 360 | Fill #2

## 2016-05-16 MED FILL — BYDUREON BCise 2 MG/0.85ML: 2 | 28 days supply | Qty: 3 | Fill #0

## 2016-05-16 MED FILL — LISINOPRIL-HCTZ 20-12.5 MG: 20-12.5 | 90 days supply | Qty: 90 | Fill #0

## 2016-05-16 MED FILL — METOPROLOL TARTRATE 25 MG T: 25 | 90 days supply | Qty: 90 | Fill #0

## 2016-06-15 MED FILL — ACCU-CHEK GUIDE TEST STRIP: 30 days supply | Qty: 100 | Fill #2

## 2016-06-15 MED FILL — BYDUREON BCise 2 MG/0.85ML: 2 | 28 days supply | Qty: 3 | Fill #1

## 2016-06-29 MED FILL — ACCU-CHEK FASTCLIX LANCETS: 30 days supply | Qty: 102 | Fill #1

## 2016-06-29 MED FILL — JANUVIA 100 MG TABLET: 100 | 90 days supply | Qty: 90 | Fill #3

## 2016-07-14 MED FILL — BYDUREON BCise 2 MG/0.85ML: 2 | 28 days supply | Qty: 3 | Fill #2

## 2016-07-15 MED FILL — ACCU-CHEK GUIDE TEST STRIP: 30 days supply | Qty: 100 | Fill #3

## 2016-08-10 MED FILL — metFORMIN HCL 500 MG TABS: 500 | 90 days supply | Qty: 360 | Fill #3

## 2016-08-10 MED FILL — BYDUREON BCise 2 MG/0.85ML: 2 | 28 days supply | Qty: 3 | Fill #3

## 2016-08-10 MED FILL — METOPROLOL TARTRATE 25 MG T: 25 | 90 days supply | Qty: 90 | Fill #1

## 2016-08-10 MED FILL — LISINOPRIL-HCTZ 20-12.5 MG: 20-12.5 | 90 days supply | Qty: 90 | Fill #1

## 2016-08-22 ENCOUNTER — Other Ambulatory Visit (HOSPITAL_COMMUNITY): Payer: Self-pay | Admitting: Family Medicine

## 2016-08-22 DIAGNOSIS — R911 Solitary pulmonary nodule: Secondary | ICD-10-CM

## 2016-08-25 ENCOUNTER — Encounter (HOSPITAL_COMMUNITY): Payer: Self-pay

## 2016-08-25 ENCOUNTER — Ambulatory Visit (HOSPITAL_COMMUNITY)
Admission: RE | Admit: 2016-08-25 | Discharge: 2016-08-25 | Disposition: A | Discharge status: Home / Self Care | Payer: 59 | Source: Ambulatory Visit | Attending: Family Medicine | Admitting: Family Medicine

## 2016-08-25 DIAGNOSIS — R911 Solitary pulmonary nodule: Secondary | ICD-10-CM | POA: Insufficient documentation

## 2016-08-25 DIAGNOSIS — R918 Other nonspecific abnormal finding of lung field: Secondary | ICD-10-CM | POA: Diagnosis not present

## 2016-09-07 MED FILL — BYDUREON BCise 2 MG/0.85ML: 2 | 28 days supply | Qty: 3 | Fill #4

## 2016-09-07 MED FILL — ACCU-CHEK FASTCLIX LANCETS: 30 days supply | Qty: 102 | Fill #2

## 2016-09-07 MED FILL — ACCU-CHEK GUIDE TEST STRIP: 30 days supply | Qty: 100 | Fill #4

## 2016-09-16 DIAGNOSIS — E1165 Type 2 diabetes mellitus with hyperglycemia: Secondary | ICD-10-CM | POA: Diagnosis not present

## 2016-09-16 DIAGNOSIS — E669 Obesity, unspecified: Secondary | ICD-10-CM | POA: Diagnosis not present

## 2016-09-16 DIAGNOSIS — I10 Essential (primary) hypertension: Secondary | ICD-10-CM | POA: Diagnosis not present

## 2016-09-16 DIAGNOSIS — Z7984 Long term (current) use of oral hypoglycemic drugs: Secondary | ICD-10-CM | POA: Diagnosis not present

## 2016-09-16 DIAGNOSIS — N814 Uterovaginal prolapse, unspecified: Secondary | ICD-10-CM | POA: Diagnosis not present

## 2016-09-16 DIAGNOSIS — E7800 Pure hypercholesterolemia, unspecified: Secondary | ICD-10-CM | POA: Diagnosis not present

## 2016-10-05 MED FILL — ACCU-CHEK FASTCLIX LANCETS: 30 days supply | Qty: 102 | Fill #3

## 2016-10-05 MED FILL — BYDUREON BCise 2 MG/0.85ML: 2 | 28 days supply | Qty: 3 | Fill #0

## 2016-10-05 MED FILL — ACCU-CHEK GUIDE TEST STRIP: 30 days supply | Qty: 100 | Fill #5

## 2016-10-06 MED FILL — JANUVIA 100 MG TABLET: 100 | 90 days supply | Qty: 90 | Fill #0

## 2016-10-11 ENCOUNTER — Other Ambulatory Visit: Payer: Self-pay | Admitting: Family Medicine

## 2016-10-11 DIAGNOSIS — Z1231 Encounter for screening mammogram for malignant neoplasm of breast: Secondary | ICD-10-CM

## 2016-10-25 MED FILL — METOPROLOL TARTRATE 25 MG T: 25 | 90 days supply | Qty: 90 | Fill #0

## 2016-10-25 MED FILL — LISINOPRIL-HCTZ 20-12.5 MG: 20-12.5 | 90 days supply | Qty: 90 | Fill #0

## 2016-11-02 MED FILL — BYDUREON BCise 2 MG/0.85ML: 2 | 28 days supply | Qty: 3 | Fill #5

## 2016-11-02 MED FILL — metFORMIN HCL 500 MG TABS: 500 | 90 days supply | Qty: 360 | Fill #0

## 2016-11-09 ENCOUNTER — Ambulatory Visit
Admission: RE | Admit: 2016-11-09 | Discharge: 2016-11-09 | Disposition: A | Discharge status: Home / Self Care | Payer: 59 | Source: Ambulatory Visit | Attending: Family Medicine | Admitting: Family Medicine

## 2016-11-09 DIAGNOSIS — Z1231 Encounter for screening mammogram for malignant neoplasm of breast: Secondary | ICD-10-CM

## 2016-11-30 MED FILL — BYDUREON BCise 2 MG/0.85ML: 2 | 28 days supply | Qty: 3 | Fill #1

## 2016-11-30 MED FILL — ACCU-CHEK GUIDE TEST STRIP: 30 days supply | Qty: 100 | Fill #6

## 2016-11-30 MED FILL — ACCU-CHEK FASTCLIX LANCETS: 30 days supply | Qty: 102 | Fill #4

## 2016-12-05 MED FILL — PEG-3350 SOLUTION: 420 | 1 days supply | Qty: 4000 | Fill #0

## 2016-12-13 DIAGNOSIS — K621 Rectal polyp: Secondary | ICD-10-CM | POA: Diagnosis not present

## 2016-12-13 DIAGNOSIS — Z86010 Personal history of colonic polyps: Secondary | ICD-10-CM | POA: Diagnosis not present

## 2016-12-14 ENCOUNTER — Other Ambulatory Visit: Payer: Self-pay | Admitting: *Deleted

## 2016-12-14 NOTE — Patient Outreach (Signed)
[name] transitioned from the [employer] program to the [employer] digital assistant platform on 1/24//18 for Type II diabetes self-management assistance so will close case to the diabetes Link To Wellness program due to delegation of disease management services to [employer] from [employer] for [employer] [facility] plan members in 2019. Bary RichardJanet S. Hauser RN,CCM,CDE [facility] Care Management Coordinator Link To Wellness and [employer] Office Phone [phone] Office Fax [phone]

## 2016-12-20 IMAGING — NM NM PULMONARY VENT & PERF
16 series · 16 of 16 positions shown · non-contrast
Comparison: Chest radiograph-earlier same day

CLINICAL DATA: Shortness of breath and difficulty breathing since
10/28/2014. Evaluate for pulmonary embolism.

EXAM:
NUCLEAR MEDICINE VENTILATION - PERFUSION LUNG SCAN
TECHNIQUE: Ventilation images were obtained in multiple projections using
inhaled aerosol 5c-PPm DTPA. Perfusion images were obtained in
multiple projections after intravenous injection of 5c-PPm MAA.
RADIOPHARMACEUTICALS:  30.4 2echnetium-DDm DTPA aerosol inhalation
and 4.0 2echnetium-DDm MAA IV

[Series 1: ant/post vent · 4.14mm/px · 1 of 1 slices shown (1 of 2)]
[im 1/1]
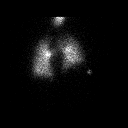

[Series 1: ant/post vent · 4.14mm/px · 1 of 1 slices shown (2 of 2)]
[im 1/1]
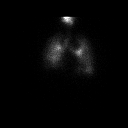

[Series 2: lao/rpo vent · 4.14mm/px · 1 of 1 slices shown (1 of 2)]
[im 1/1]
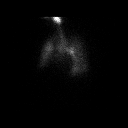

[Series 2: lao/rpo vent · 4.14mm/px · 1 of 1 slices shown (2 of 2)]
[im 1/1  full-range]
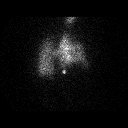

[Series 3: lpo/rao vent · 4.14mm/px · 1 of 1 slices shown (1 of 2)]
[im 1/1]
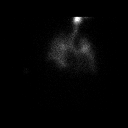

[Series 3: lpo/rao vent · 4.14mm/px · 1 of 1 slices shown (2 of 2)]
[im 1/1  full-range]
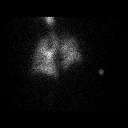

[Series 4: lt lat/rt lat vent · 4.14mm/px · 1 of 1 slices shown (1 of 2)]
[im 1/1  full-range]
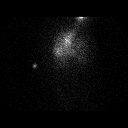

[Series 4: lt lat/rt lat vent · 4.14mm/px · 1 of 1 slices shown (2 of 2)]
[im 1/1  full-range]
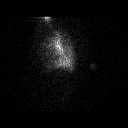

[Series 5: lt lat/rt lat perf · 4.14mm/px · 1 of 1 slices shown (1 of 2)]
[im 1/1]
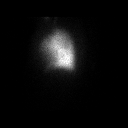

[Series 5: lt lat/rt lat perf · 4.14mm/px · 1 of 1 slices shown (2 of 2)]
[im 1/1]
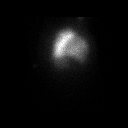

[Series 6: lpo/rao perf · 4.14mm/px · 1 of 1 slices shown (1 of 2)]
[im 1/1]
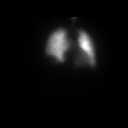

[Series 6: lpo/rao perf · 4.14mm/px · 1 of 1 slices shown (2 of 2)]
[im 1/1]
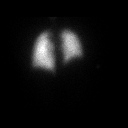

[Series 7: ant/post perf · 4.14mm/px · 1 of 1 slices shown (1 of 2)]
[im 1/1]
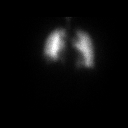

[Series 7: ant/post perf · 4.14mm/px · 1 of 1 slices shown (2 of 2)]
[im 1/1]
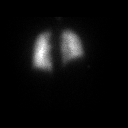

[Series 8: lao/rpo perf · 4.14mm/px · 1 of 1 slices shown (1 of 2)]
[im 1/1]
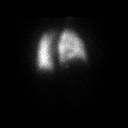

[Series 8: lao/rpo perf · 4.14mm/px · 1 of 1 slices shown (2 of 2)]
[im 1/1]
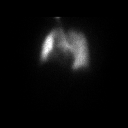

[16 of 16 positions shown; findings below may reference images not displayed]

FINDINGS: Review of chest radiograph performed earlier same day demonstrates
borderline enlargement of the cardiac silhouette and mediastinal
contours. Opacities overlying the bilateral lower lungs are favored
to represent overlying breast tissues and abdominal pannus. No
discrete focal airspace opacities. No definite pleural effusion or
pneumothorax. No evidence of edema.

Ventilation: There is relative distribution of inhaled radiotracer
with minimal clumping about the bilateral pulmonary hila. A minimal
amount of radiotracer stress and a minimal amount of ingested
radiotracer is seen within the esophagus.

Perfusion: There is relative homogeneous distribution of injected
radiotracer without discrete segmental or subsegmental mismatched
filling defect to suggest pulmonary embolism.
IMPRESSION: Pulmonary embolism absent (very low probability of pulmonary
embolism).

## 2016-12-28 MED FILL — ACCU-CHEK GUIDE TEST STRIP: 30 days supply | Qty: 100 | Fill #7

## 2016-12-29 MED FILL — BYDUREON BCise 2 MG/0.85ML: 2 | 28 days supply | Qty: 3 | Fill #6

## 2017-01-17 MED FILL — LISINOPRIL-HCTZ 20-12.5 MG: 20-12.5 | 90 days supply | Qty: 90 | Fill #1

## 2017-01-17 MED FILL — JANUVIA 100 MG TABLET: 100 | 90 days supply | Qty: 90 | Fill #1

## 2017-01-26 MED FILL — metFORMIN HCL 500 MG TABS: 500 | 90 days supply | Qty: 360 | Fill #1

## 2017-01-26 MED FILL — BYDUREON BCise 2 MG/0.85ML: 2 | 28 days supply | Qty: 3 | Fill #7

## 2017-02-23 MED FILL — ACCU-CHEK GUIDE TEST STRIP: 30 days supply | Qty: 100 | Fill #8

## 2017-02-23 MED FILL — ACCU-CHEK FASTCLIX LANCETS: 30 days supply | Qty: 102 | Fill #5

## 2017-02-23 MED FILL — BYDUREON BCise 2 MG/0.85ML: 2 | 28 days supply | Qty: 3 | Fill #8

## 2017-03-13 ENCOUNTER — Other Ambulatory Visit: Payer: Self-pay | Admitting: *Deleted

## 2017-03-13 DIAGNOSIS — Z1159 Encounter for screening for other viral diseases: Secondary | ICD-10-CM | POA: Diagnosis not present

## 2017-03-13 DIAGNOSIS — E7800 Pure hypercholesterolemia, unspecified: Secondary | ICD-10-CM | POA: Diagnosis not present

## 2017-03-13 DIAGNOSIS — E119 Type 2 diabetes mellitus without complications: Secondary | ICD-10-CM | POA: Diagnosis not present

## 2017-03-13 DIAGNOSIS — E669 Obesity, unspecified: Secondary | ICD-10-CM | POA: Diagnosis not present

## 2017-03-13 DIAGNOSIS — I10 Essential (primary) hypertension: Secondary | ICD-10-CM | POA: Diagnosis not present

## 2017-03-13 DIAGNOSIS — Z0000 Encounter for general adult medical examination without abnormal findings: Secondary | ICD-10-CM | POA: Diagnosis not present

## 2017-03-13 NOTE — Patient Outreach (Signed)
Left message on Brittany [name]'s mobile number advising her that her 6 week CBG report from [facility] digital assistant platform for Type II DM self management assistance- was faxed to Dr. Quita SkyeMitchell's office this morning and a message left with his office nurse to be sure Dr. Clovis RileyMitchell gets the report before he sees Brittany [name] for her 9:30 am appointment. Also advised Brittany [name] that a copy of the CBG report was securely faxed to her [facility] e-mail address. Requested that she message this RNCM in [facility] about any changes made to her diabetes treatment plan.  Bary RichardJanet S. Hauser RN,CCM,CDE [facility] Care Management Coordinator Office Phone [phone] Office Fax [phone]

## 2017-03-21 MED FILL — SF 5000 PLUS CREAM: 1.1 | 60 days supply | Qty: 102 | Fill #0

## 2017-03-21 MED FILL — BYDUREON BCise 2 MG/0.85ML: 2 | 28 days supply | Qty: 3 | Fill #9

## 2017-03-22 MED FILL — ACCU-CHEK GUIDE STRP: 30 days supply | Qty: 100 | Fill #0

## 2017-03-22 MED FILL — ATORVASTATIN 80 MG TABLET: 80 | 90 days supply | Qty: 90 | Fill #0

## 2017-03-22 MED FILL — JARDIANCE 25 MG TABLET: 25 | 90 days supply | Qty: 90 | Fill #0

## 2017-03-22 MED FILL — ACCU-CHEK FASTCLIX LANCETS: 30 days supply | Qty: 102 | Fill #0

## 2017-04-10 MED FILL — LISINOPRIL-HCTZ 20-12.5 MG: 20-12.5 | 90 days supply | Qty: 90 | Fill #0

## 2017-04-10 MED FILL — JANUVIA 100 MG TABLET: 100 | 90 days supply | Qty: 90 | Fill #2

## 2017-04-10 MED FILL — METOPROLOL TARTRATE 25 MG T: 25 | 90 days supply | Qty: 90 | Fill #1

## 2017-04-24 MED FILL — BYDUREON BCise 2 MG/0.85ML: 2 | 28 days supply | Qty: 3 | Fill #2

## 2017-04-25 MED FILL — ACCU-CHEK GUIDE STRP: 30 days supply | Qty: 100 | Fill #1

## 2017-05-18 DIAGNOSIS — H2511 Age-related nuclear cataract, right eye: Secondary | ICD-10-CM | POA: Diagnosis not present

## 2017-05-18 DIAGNOSIS — H538 Other visual disturbances: Secondary | ICD-10-CM | POA: Diagnosis not present

## 2017-05-18 DIAGNOSIS — H2513 Age-related nuclear cataract, bilateral: Secondary | ICD-10-CM | POA: Diagnosis not present

## 2017-05-18 DIAGNOSIS — E119 Type 2 diabetes mellitus without complications: Secondary | ICD-10-CM | POA: Diagnosis not present

## 2017-05-18 MED FILL — BYDUREON BCise 2 MG/0.85ML: 2 | 28 days supply | Qty: 3 | Fill #3

## 2017-05-18 MED FILL — ACCU-CHEK FASTCLIX LANCETS: 30 days supply | Qty: 102 | Fill #1

## 2017-05-31 MED FILL — metFORMIN HCL 500 MG TABS: 500 | 90 days supply | Qty: 360 | Fill #2

## 2017-06-19 MED FILL — ACCU-CHEK GUIDE STRP: 30 days supply | Qty: 100 | Fill #2

## 2017-06-19 MED FILL — BYDUREON BCise 2 MG/0.85ML: 2 | 28 days supply | Qty: 3 | Fill #4

## 2017-06-19 MED FILL — ATORVASTATIN 80 MG TABLET: 80 | 90 days supply | Qty: 90 | Fill #1

## 2017-06-19 MED FILL — ACCU-CHEK FASTCLIX LANCETS: 30 days supply | Qty: 102 | Fill #2

## 2017-07-18 MED FILL — BYDUREON BCise 2 MG/0.85ML: 2 | 28 days supply | Qty: 3 | Fill #5

## 2017-07-18 MED FILL — JANUVIA 100 MG TABLET: 100 | 90 days supply | Qty: 90 | Fill #3

## 2017-08-15 MED FILL — LISINOPRIL-HCTZ 20-12.5 MG: 20-12.5 | 90 days supply | Qty: 90 | Fill #1

## 2017-08-15 MED FILL — METOPROLOL TARTRATE 25 MG T: 25 | 90 days supply | Qty: 90 | Fill #0

## 2017-08-15 MED FILL — BYDUREON BCise 2 MG/0.85ML: 2 | 28 days supply | Qty: 3 | Fill #6

## 2017-08-15 MED FILL — ACCU-CHEK FASTCLIX LANCETS: 30 days supply | Qty: 102 | Fill #3

## 2017-08-15 MED FILL — ACCU-CHEK GUIDE STRP: 30 days supply | Qty: 100 | Fill #3

## 2017-08-28 MED FILL — metFORMIN HCL 500 MG TABS: 500 | 90 days supply | Qty: 360 | Fill #3

## 2017-09-14 MED FILL — JARDIANCE 25 MG TABLET: 25 | 90 days supply | Qty: 90 | Fill #1

## 2017-09-14 MED FILL — ACCU-CHEK FASTCLIX LANCETS: 30 days supply | Qty: 102 | Fill #4

## 2017-09-14 MED FILL — BYDUREON BCise 2 MG/0.85ML: 2 | 28 days supply | Qty: 3 | Fill #7

## 2017-09-14 MED FILL — ACCU-CHEK GUIDE STRP: 30 days supply | Qty: 100 | Fill #4

## 2017-09-19 DIAGNOSIS — E669 Obesity, unspecified: Secondary | ICD-10-CM | POA: Diagnosis not present

## 2017-09-19 DIAGNOSIS — I10 Essential (primary) hypertension: Secondary | ICD-10-CM | POA: Diagnosis not present

## 2017-09-19 DIAGNOSIS — E119 Type 2 diabetes mellitus without complications: Secondary | ICD-10-CM | POA: Diagnosis not present

## 2017-09-19 DIAGNOSIS — E7800 Pure hypercholesterolemia, unspecified: Secondary | ICD-10-CM | POA: Diagnosis not present

## 2017-09-19 DIAGNOSIS — Z7984 Long term (current) use of oral hypoglycemic drugs: Secondary | ICD-10-CM | POA: Diagnosis not present

## 2017-10-03 ENCOUNTER — Other Ambulatory Visit: Payer: Self-pay

## 2017-10-03 DIAGNOSIS — Z1231 Encounter for screening mammogram for malignant neoplasm of breast: Secondary | ICD-10-CM

## 2017-10-10 MED FILL — BYDUREON BCise 2 MG/0.85ML: 2 | 28 days supply | Qty: 3 | Fill #0

## 2017-10-10 MED FILL — JANUVIA 100 MG TABLET: 100 | 90 days supply | Qty: 90 | Fill #0

## 2017-10-17 MED FILL — ACCU-CHEK GUIDE STRP: 30 days supply | Qty: 100 | Fill #5

## 2017-10-17 MED FILL — ACCU-CHEK FASTCLIX LANCETS: 30 days supply | Qty: 102 | Fill #5

## 2017-11-06 MED FILL — LISINOPRIL-HCTZ 20-12.5 MG: 20-12.5 | 90 days supply | Qty: 90 | Fill #0

## 2017-11-06 MED FILL — BYDUREON BCise 2 MG/0.85ML: 2 | 28 days supply | Qty: 3 | Fill #1

## 2017-11-10 ENCOUNTER — Ambulatory Visit
Admission: RE | Admit: 2017-11-10 | Discharge: 2017-11-10 | Disposition: A | Discharge status: Home / Self Care | Payer: 59 | Source: Ambulatory Visit | Attending: Family Medicine | Admitting: Family Medicine

## 2017-11-10 DIAGNOSIS — Z1231 Encounter for screening mammogram for malignant neoplasm of breast: Secondary | ICD-10-CM | POA: Diagnosis not present

## 2017-11-20 MED FILL — metFORMIN HCL 500 MG TABS: 500 | 90 days supply | Qty: 360 | Fill #0

## 2017-11-20 MED FILL — ACCU-CHEK GUIDE STRP: 30 days supply | Qty: 100 | Fill #6

## 2017-11-20 MED FILL — ACCU-CHEK FASTCLIX LANCETS: 30 days supply | Qty: 102 | Fill #6

## 2017-12-04 MED FILL — BYDUREON BCise 2 MG/0.85ML: 2 | 28 days supply | Qty: 3 | Fill #2

## 2017-12-18 MED FILL — JARDIANCE 25 MG TABLET: 25 | 90 days supply | Qty: 90 | Fill #2

## 2018-01-01 MED FILL — BYDUREON BCise 2 MG/0.85ML: 2 | 28 days supply | Qty: 3 | Fill #3

## 2018-01-01 MED FILL — JANUVIA 100 MG TABLET: 100 | 90 days supply | Qty: 90 | Fill #1

## 2018-01-01 MED FILL — METOPROLOL TARTRATE 25 MG T: 25 | 90 days supply | Qty: 90 | Fill #1

## 2018-01-16 MED FILL — ACCU-CHEK FASTCLIX LANCETS: 30 days supply | Qty: 102 | Fill #7

## 2018-01-16 MED FILL — ACCU-CHEK GUIDE STRP: 30 days supply | Qty: 100 | Fill #7

## 2018-01-30 MED FILL — BYDUREON BCise 2 MG/0.85ML: 2 | 28 days supply | Qty: 3 | Fill #4

## 2018-01-30 MED FILL — LISINOPRIL-HCTZ 20-12.5 MG: 20-12.5 | 90 days supply | Qty: 90 | Fill #1

## 2018-02-12 MED FILL — ACCU-CHEK FASTCLIX LANCETS: 30 days supply | Qty: 102 | Fill #8

## 2018-02-12 MED FILL — metFORMIN HCL 500 MG TABS: 500 | 90 days supply | Qty: 360 | Fill #1 | Status: TO

## 2018-02-12 MED FILL — ACCU-CHEK GUIDE STRP: 30 days supply | Qty: 100 | Fill #8

## 2018-02-27 MED FILL — BYDUREON BCise 2 MG/0.85ML: 2 | 28 days supply | Qty: 3 | Fill #5

## 2018-03-10 MED FILL — ACCU-CHEK GUIDE STRP: 30 days supply | Qty: 100 | Fill #9

## 2018-03-10 MED FILL — JARDIANCE 25 MG TABLET: 25 | 90 days supply | Qty: 90 | Fill #3

## 2018-03-26 MED FILL — BYDUREON BCise 2 MG/0.85ML: 2 | 28 days supply | Qty: 3 | Fill #6 | Status: TO

## 2018-04-12 MED FILL — ACCU-CHEK FASTCLIX LANCETS: 34 days supply | Qty: 102 | Fill #0

## 2018-04-18 MED FILL — ACCU-CHEK GUIDE TEST STRIP: 33 days supply | Qty: 100 | Fill #0

## 2018-04-19 MED FILL — BYDUREON BCise 2 MG/0.85ML: 2 | 28 days supply | Qty: 3 | Fill #0

## 2018-04-26 MED FILL — JANUVIA 100 MG TABLET: 100 | 90 days supply | Qty: 90 | Fill #0

## 2018-04-26 MED FILL — LISINOPRIL-HCTZ 20-12.5 MG: 20-12.5 | 90 days supply | Qty: 90 | Fill #0

## 2018-05-07 MED FILL — metFORMIN HCL 500 MG TABS: 500 | 90 days supply | Qty: 360 | Fill #0

## 2018-05-21 DIAGNOSIS — H538 Other visual disturbances: Secondary | ICD-10-CM | POA: Diagnosis not present

## 2018-05-21 DIAGNOSIS — E119 Type 2 diabetes mellitus without complications: Secondary | ICD-10-CM | POA: Diagnosis not present

## 2018-05-21 DIAGNOSIS — H2513 Age-related nuclear cataract, bilateral: Secondary | ICD-10-CM | POA: Diagnosis not present

## 2018-05-21 DIAGNOSIS — H2511 Age-related nuclear cataract, right eye: Secondary | ICD-10-CM | POA: Diagnosis not present

## 2018-05-23 MED FILL — BYDUREON BCise 2 MG/0.85ML: 2 | 28 days supply | Qty: 3 | Fill #1

## 2018-05-25 DIAGNOSIS — Z7984 Long term (current) use of oral hypoglycemic drugs: Secondary | ICD-10-CM | POA: Diagnosis not present

## 2018-05-25 DIAGNOSIS — E7800 Pure hypercholesterolemia, unspecified: Secondary | ICD-10-CM | POA: Diagnosis not present

## 2018-05-25 DIAGNOSIS — I10 Essential (primary) hypertension: Secondary | ICD-10-CM | POA: Diagnosis not present

## 2018-05-25 DIAGNOSIS — E119 Type 2 diabetes mellitus without complications: Secondary | ICD-10-CM | POA: Diagnosis not present

## 2018-05-25 DIAGNOSIS — E669 Obesity, unspecified: Secondary | ICD-10-CM | POA: Diagnosis not present

## 2018-06-01 DIAGNOSIS — Z7984 Long term (current) use of oral hypoglycemic drugs: Secondary | ICD-10-CM | POA: Diagnosis not present

## 2018-06-01 DIAGNOSIS — E119 Type 2 diabetes mellitus without complications: Secondary | ICD-10-CM | POA: Diagnosis not present

## 2018-06-01 DIAGNOSIS — E7800 Pure hypercholesterolemia, unspecified: Secondary | ICD-10-CM | POA: Diagnosis not present

## 2018-06-08 MED FILL — ACCU-CHEK GUIDE TEST STRIP: 33 days supply | Qty: 100 | Fill #1

## 2018-06-08 MED FILL — JARDIANCE 25 MG TABLET: 25 | 90 days supply | Qty: 90 | Fill #0

## 2018-06-15 MED FILL — ROSUVASTATIN CALCIUM 40 MG: 40 | 30 days supply | Qty: 30 | Fill #0

## 2018-06-21 MED FILL — ACCU-CHEK FASTCLIX LANCETS: 34 days supply | Qty: 102 | Fill #1

## 2018-06-21 MED FILL — BYDUREON BCise 2 MG/0.85ML: 2 | 28 days supply | Qty: 3 | Fill #2

## 2018-07-04 MED FILL — ACCU-CHEK GUIDE TEST STRIP: 33 days supply | Qty: 100 | Fill #2

## 2018-07-24 MED FILL — JANUVIA 100 MG TABLET: 100 | 90 days supply | Qty: 90 | Fill #1

## 2018-07-24 MED FILL — ROSUVASTATIN CALCIUM 40 MG: 40 | 30 days supply | Qty: 30 | Fill #1

## 2018-07-24 MED FILL — METOPROLOL TARTRATE 25 MG T: 25 | 90 days supply | Qty: 90 | Fill #0

## 2018-07-24 MED FILL — LISINOPRIL-HCTZ 20-12.5 MG: 20-12.5 | 90 days supply | Qty: 90 | Fill #1

## 2018-07-24 MED FILL — BYDUREON BCise 2 MG/0.85ML: 2 | 28 days supply | Qty: 3 | Fill #3

## 2018-08-20 MED FILL — metFORMIN HCL 500 MG TABS: 500 | 90 days supply | Qty: 360 | Fill #1

## 2018-08-20 MED FILL — BYDUREON BCise 2 MG/0.85ML: 2 | 28 days supply | Qty: 3 | Fill #4

## 2018-08-28 MED FILL — ROSUVASTATIN CALCIUM 40 MG: 40 | 30 days supply | Qty: 30 | Fill #0

## 2018-09-18 MED FILL — BYDUREON BCise 2 MG/0.85ML: 2 | 28 days supply | Qty: 3 | Fill #0

## 2018-09-18 MED FILL — ACCU-CHEK GUIDE TEST STRIP: 33 days supply | Qty: 100 | Fill #0

## 2018-09-18 MED FILL — ACCU-CHEK FASTCLIX LANCETS: 34 days supply | Qty: 102 | Fill #0

## 2018-09-18 MED FILL — JARDIANCE 25 MG TABLET: 25 | 90 days supply | Qty: 90 | Fill #0

## 2018-10-18 ENCOUNTER — Other Ambulatory Visit: Payer: Self-pay | Admitting: Family Medicine

## 2018-10-18 DIAGNOSIS — Z1231 Encounter for screening mammogram for malignant neoplasm of breast: Secondary | ICD-10-CM

## 2018-11-14 MED FILL — metFORMIN HCL 500 MG TABS: 500 | 90 days supply | Qty: 360 | Fill #0

## 2018-11-14 MED FILL — ACCU-CHEK GUIDE TEST STRIP: 33 days supply | Qty: 100 | Fill #1

## 2018-11-14 MED FILL — BYDUREON BCise 2 MG/0.85ML: 2 | 28 days supply | Qty: 3 | Fill #2

## 2018-11-14 MED FILL — ACCU-CHEK FASTCLIX LANCETS: 34 days supply | Qty: 102 | Fill #1

## 2018-11-29 DIAGNOSIS — I10 Essential (primary) hypertension: Secondary | ICD-10-CM | POA: Diagnosis not present

## 2018-11-29 DIAGNOSIS — E119 Type 2 diabetes mellitus without complications: Secondary | ICD-10-CM | POA: Diagnosis not present

## 2018-11-29 DIAGNOSIS — Z0000 Encounter for general adult medical examination without abnormal findings: Secondary | ICD-10-CM | POA: Diagnosis not present

## 2018-11-29 DIAGNOSIS — E669 Obesity, unspecified: Secondary | ICD-10-CM | POA: Diagnosis not present

## 2018-11-29 DIAGNOSIS — E7800 Pure hypercholesterolemia, unspecified: Secondary | ICD-10-CM | POA: Diagnosis not present

## 2018-11-30 ENCOUNTER — Other Ambulatory Visit: Payer: Self-pay

## 2018-11-30 ENCOUNTER — Ambulatory Visit
Admission: RE | Admit: 2018-11-30 | Discharge: 2018-11-30 | Disposition: A | Discharge status: Home / Self Care | Payer: 59 | Source: Ambulatory Visit | Attending: Family Medicine | Admitting: Family Medicine

## 2018-11-30 DIAGNOSIS — Z1231 Encounter for screening mammogram for malignant neoplasm of breast: Secondary | ICD-10-CM | POA: Diagnosis not present

## 2018-12-04 DIAGNOSIS — E119 Type 2 diabetes mellitus without complications: Secondary | ICD-10-CM | POA: Diagnosis not present

## 2018-12-04 DIAGNOSIS — E7800 Pure hypercholesterolemia, unspecified: Secondary | ICD-10-CM | POA: Diagnosis not present

## 2018-12-10 MED FILL — BYDUREON BCise 2 MG/0.85ML: 2 | 28 days supply | Qty: 3 | Fill #3

## 2018-12-11 MED FILL — JARDIANCE 25 MG TABLET: 25 | 90 days supply | Qty: 90 | Fill #0

## 2018-12-17 MED FILL — REPAGLINIDE 0.5 MG TABLET: 0.5 | 90 days supply | Qty: 90 | Fill #0

## 2019-01-09 MED FILL — JANUVIA 100 MG TABLET: 100 | 90 days supply | Qty: 90 | Fill #1

## 2019-01-09 MED FILL — BYDUREON BCise 2 MG/0.85ML: 2 | 28 days supply | Qty: 3 | Fill #4

## 2019-01-09 MED FILL — ACCU-CHEK FASTCLIX LANCETS: 34 days supply | Qty: 102 | Fill #2

## 2019-01-09 MED FILL — LISINOPRIL-HCTZ 20-12.5 MG: 20-12.5 | 90 days supply | Qty: 90 | Fill #1

## 2019-01-09 MED FILL — ACCU-CHEK GUIDE TEST STRIP: 33 days supply | Qty: 100 | Fill #2

## 2019-02-06 MED FILL — BYDUREON BCise 2 MG/0.85ML: 2 | 28 days supply | Qty: 3 | Fill #5

## 2019-02-19 MED FILL — metFORMIN HCL 500 MG TABS: 500 | 90 days supply | Qty: 360 | Fill #1

## 2019-02-20 MED FILL — FREESTYLE LANCETS: 66 days supply | Qty: 200 | Fill #0

## 2019-02-20 MED FILL — FREESTYLE LITE METER: 30 days supply | Qty: 1 | Fill #0

## 2019-02-20 MED FILL — FREESTYLE LITE TEST STRIP: 83 days supply | Qty: 250 | Fill #0

## 2019-03-05 MED FILL — BYDUREON BCise 2 MG/0.85ML: 2 | 28 days supply | Qty: 3 | Fill #6

## 2019-03-06 MED FILL — JARDIANCE 25 MG TABLET: 25 | 90 days supply | Qty: 90 | Fill #0

## 2019-04-02 DIAGNOSIS — N8110 Cystocele, unspecified: Secondary | ICD-10-CM | POA: Diagnosis not present

## 2019-04-08 ENCOUNTER — Other Ambulatory Visit (HOSPITAL_COMMUNITY): Payer: Self-pay | Admitting: Family Medicine

## 2019-04-08 MED FILL — BYDUREON BCise 2 MG/0.85ML: 2 | 28 days supply | Qty: 3 | Fill #0

## 2019-04-18 ENCOUNTER — Emergency Department (HOSPITAL_COMMUNITY): Payer: 59

## 2019-04-18 ENCOUNTER — Emergency Department (HOSPITAL_COMMUNITY)
Admission: EM | Admit: 2019-04-18 | Discharge: 2019-04-18 | Disposition: A | Discharge status: Home / Self Care | Payer: 59 | Attending: Emergency Medicine | Admitting: Emergency Medicine

## 2019-04-18 ENCOUNTER — Other Ambulatory Visit: Payer: Self-pay

## 2019-04-18 DIAGNOSIS — M7531 Calcific tendinitis of right shoulder: Secondary | ICD-10-CM | POA: Insufficient documentation

## 2019-04-18 DIAGNOSIS — I110 Hypertensive heart disease with heart failure: Secondary | ICD-10-CM | POA: Insufficient documentation

## 2019-04-18 DIAGNOSIS — I5032 Chronic diastolic (congestive) heart failure: Secondary | ICD-10-CM | POA: Insufficient documentation

## 2019-04-18 DIAGNOSIS — Z7984 Long term (current) use of oral hypoglycemic drugs: Secondary | ICD-10-CM | POA: Diagnosis not present

## 2019-04-18 DIAGNOSIS — M25511 Pain in right shoulder: Secondary | ICD-10-CM | POA: Diagnosis not present

## 2019-04-18 DIAGNOSIS — E119 Type 2 diabetes mellitus without complications: Secondary | ICD-10-CM | POA: Insufficient documentation

## 2019-04-18 DIAGNOSIS — Z79899 Other long term (current) drug therapy: Secondary | ICD-10-CM | POA: Insufficient documentation

## 2019-04-18 DIAGNOSIS — X503XXA Overexertion from repetitive movements, initial encounter: Secondary | ICD-10-CM | POA: Insufficient documentation

## 2019-04-18 DIAGNOSIS — R000 Tachycardia, unspecified: Secondary | ICD-10-CM | POA: Diagnosis not present

## 2019-04-18 LAB — CBC
HCT: 46.2 % — ABNORMAL HIGH (ref 36.0–46.0)
Hemoglobin: 13.8 g/dL (ref 12.0–15.0)
MCH: 23.6 pg — ABNORMAL LOW (ref 26.0–34.0)
MCHC: 29.9 g/dL — ABNORMAL LOW (ref 30.0–36.0)
MCV: 79 fL — ABNORMAL LOW (ref 80.0–100.0)
Platelets: 247 10*3/uL (ref 150–400)
RBC: 5.85 MIL/uL — ABNORMAL HIGH (ref 3.87–5.11)
RDW: 14.8 % (ref 11.5–15.5)
WBC: 9.4 10*3/uL (ref 4.0–10.5)
nRBC: 0 % (ref 0.0–0.2)

## 2019-04-18 LAB — TROPONIN I (HIGH SENSITIVITY)
Troponin I (High Sensitivity): 3 ng/L (ref <18)
Troponin I (High Sensitivity): 3 ng/L (ref <18)

## 2019-04-18 LAB — BASIC METABOLIC PANEL
Anion gap: 15 (ref 5–15)
BUN: 11 mg/dL (ref 8–23)
CO2: 24 mmol/L (ref 22–32)
Calcium: 9.6 mg/dL (ref 8.9–10.3)
Chloride: 101 mmol/L (ref 98–111)
Creatinine, Ser: 0.85 mg/dL (ref 0.44–1.00)
GFR calc Af Amer: >60 mL/min (ref >60)
GFR calc non Af Amer: >60 mL/min (ref >60)
Glucose, Bld: 238 mg/dL — ABNORMAL HIGH (ref 70–99)
Potassium: 3.6 mmol/L (ref 3.5–5.1)
Sodium: 140 mmol/L (ref 135–145)

## 2019-04-18 MED ORDER — HYDROCODONE-ACETAMINOPHEN 5-325 MG PO TABS: 1.0000 | ORAL_TABLET | ORAL | 0 refills | Status: DC | PRN

## 2019-04-18 MED ORDER — METHOCARBAMOL 500 MG PO TABS
500.0000 mg | ORAL_TABLET | Freq: Once | ORAL | Status: AC
  Administered 2019-04-18: 500 mg via ORAL
  Filled 2019-04-18: qty 1

## 2019-04-18 MED ORDER — OXYCODONE-ACETAMINOPHEN 5-325 MG PO TABS
1.0000 | ORAL_TABLET | Freq: Once | ORAL | Status: AC
  Administered 2019-04-18: 05:00:00 1 via ORAL
  Filled 2019-04-18: qty 1

## 2019-04-18 MED ORDER — MELOXICAM 7.5 MG PO TABS: 15.0000 mg | ORAL_TABLET | Freq: Every day | ORAL | 0 refills | Status: DC

## 2019-04-18 MED ORDER — SODIUM CHLORIDE 0.9% FLUSH: 3.0000 mL | Freq: Once | INTRAVENOUS | Status: DC

## 2019-04-18 NOTE — ED Triage Notes (Signed)
The pt is complaining of rt shoulder pain that woke her up around 2230 and has become more painful and less mobile  Pain worse with movement

## 2019-04-18 NOTE — Discharge Instructions (Signed)
Take the prescribed medication as directed.  Do not drive while taking pain medication, it can make you drowsy. Follow-up with your primary care doctor. If shoulder is not improvement or mobility is worsening, I would schedule some follow-up with orthopedics.  You can call Dr. Luvenia Starch office to arrange appt or have your primary care doctor refer you. Return to the ED for new or worsening symptoms.

## 2019-04-18 NOTE — ED Notes (Signed)
Pt was discharged from the ED. Pt read and understood discharge paperwork. Pt had vital signs completed. Pt conscious, breathing, and A&Ox4. No distress noted. Pt speaking in complete sentences. Pt ambulated out of the ED with a smooth and steady gait. E-signature not available.  

## 2019-04-18 NOTE — ED Provider Notes (Signed)
[facility] EMERGENCY DEPARTMENT Provider Note   CSN: [id number] Arrival date & time: 04/18/19  0055     History Chief Complaint  Patient presents with  . Shoulder Pain    Brittany [name] is a 64 y.o. female.  The history is provided by the patient and medical records.  Shoulder Pain   64 y.o. F with hx of DM, HTN, HLP, presenting to the ED with right shoulder pain.  Patient reports this began last evening and woke her from sleep.  States pain is worse with any attempted movement, better sitting still with arm rested at her side.  She denies any known injury, trauma, or falls.  She is right-hand dominant.  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  She denies any heavy lifting.  She is not had any numbness or weakness of the right arm.  She did take 2 Tylenol prior to arrival without relief.  No chest pain or shortness of breath.  No cardiac history.  Past Medical History:  Diagnosis Date  . Diabetes mellitus without complication (HCC)   . Hyperlipidemia   . Hypertension     Patient Active Problem List   Diagnosis Date Noted  . Pyrexia   . SIRS (systemic inflammatory response syndrome) (HCC) 10/26/2014  . Tachypnea 10/26/2014  . Hyperlipidemia 10/26/2014  . Elevated transaminase level 10/26/2014  . Sepsis (HCC) 10/25/2014  . Leukocytosis   . Chronic diastolic congestive heart failure (HCC)   . Diabetes mellitus type 2, uncontrolled, with complications (HCC) 10/20/2014  . Hypokalemia 10/20/2014  . Shortness of breath   . Elevated troponin   . Diabetes (HCC) 09/13/2012    No past surgical history on file.   OB History   No obstetric history on file.     Family History  Problem Relation Age of Onset  . Heart block Mother   . Asthma Other   . Hypertension Other   . Hyperlipidemia Other   . Diabetes Other     Social History   Tobacco Use  . Smoking status: Former Games developer  . Smokeless tobacco:  Never Used  Substance Use Topics  . Alcohol use: No    Alcohol/week: 0.0 standard drinks  . Drug use: No    Home Medications Prior to Admission medications   Medication Sig Start Date End Date Taking? Authorizing Provider  aspirin 81 MG tablet Take 81 mg by mouth daily.    [provider]  Exenatide ER (BYDUREON) 2 MG SUSR Inject 2 mg into the skin once a week. Saturday    [provider]  levofloxacin (LEVAQUIN) 500 MG tablet Take 1 tablet (500 mg total) by mouth daily. Patient not taking: Reported on 11/12/2015 10/27/14   Jerald Kief, MD  lisinopril-hydrochlorothiazide (PRINZIDE,ZESTORETIC) 20-12.5 MG per tablet Take 1 tablet by mouth daily.    [provider]  loratadine-pseudoephedrine (CLARITIN-D 24-HOUR) 10-240 MG 24 hr tablet Take 1 tablet by mouth daily. 10/27/14   Jerald Kief, MD  metFORMIN (GLUCOPHAGE) 500 MG tablet Take 1,000 mg by mouth 2 (two) times daily with a meal.    [provider]  metoprolol tartrate (LOPRESSOR) 25 MG tablet Take 0.5 tablets (12.5 mg total) by mouth 2 (two) times daily. 10/24/14   Lonia Blood, MD  Multiple Vitamins-Calcium (ONE-A-DAY WOMENS PO) Take 1 tablet by mouth daily.    [provider]  potassium chloride SA (K-DUR,KLOR-CON) 20 MEQ tablet Take 1  tablet (20 mEq total) by mouth daily. Patient not taking: Reported on 11/12/2015 10/24/14   Lonia Blood, MD  sitaGLIPtin (JANUVIA) 100 MG tablet Take 100 mg by mouth daily.    [provider]    Allergies    Actos [pioglitazone], Invokana [canagliflozin], and Statins  Review of Systems   Review of Systems  Musculoskeletal: Positive for arthralgias.  All other systems reviewed and are negative.   Physical Exam Updated Vital Signs BP (!) 172/89 (BP Location: Left Arm)   Pulse (!) 113   Temp 98.9 F (37.2 C) (Oral)   Resp 16   SpO2 95%   Physical Exam Vitals and nursing note reviewed.  Constitutional:      Appearance:  She is well-developed.  HENT:     Head: Normocephalic and atraumatic.  Eyes:     Conjunctiva/sclera: Conjunctivae normal.     Pupils: Pupils are equal, round, and reactive to light.  Cardiovascular:     Rate and Rhythm: Normal rate and regular rhythm.     Heart sounds: Normal heart sounds.  Pulmonary:     Effort: Pulmonary effort is normal.     Breath sounds: Normal breath sounds.  Abdominal:     General: Bowel sounds are normal.     Palpations: Abdomen is soft.  Musculoskeletal:        General: Normal range of motion.     Cervical back: Normal range of motion.     Comments: Right shoulder grossly normal in appearance without swelling or bony deformity, tenderness along entire lateral edge of shoulder; clavicle and posterior shoulder are non-tender; pain with ROM of shoulder, especially with abduction; normal grip strength, normal distal sensation and perfusion  Skin:    General: Skin is warm and dry.  Neurological:     Mental Status: She is alert and oriented to person, place, and time.     ED Results / Procedures / Treatments   Labs (all labs ordered are listed, but only abnormal results are displayed) Labs Reviewed  BASIC METABOLIC PANEL - Abnormal; Notable for the following components:      Result Value   Glucose, Bld 238 (*)    All other components within normal limits  CBC - Abnormal; Notable for the following components:   RBC 5.85 (*)    HCT 46.2 (*)    MCV 79.0 (*)    MCH 23.6 (*)    MCHC 29.9 (*)    All other components within normal limits  TROPONIN I (HIGH SENSITIVITY)  TROPONIN I (HIGH SENSITIVITY)    EKG EKG Interpretation  Date/Time:  Thursday April 18 2019 00:57:19 EDT Ventricular Rate:  105 PR Interval:  130 QRS Duration: 84 QT Interval:  324 QTC Calculation: 428 R Axis:   61 Text Interpretation: Sinus tachycardia Possible Anterior infarct , age undetermined Abnormal ECG No significant change since last tracing Confirmed by Marily Memos [phone])  on 04/18/2019 3:56:20 AM   Radiology DG Chest 2 View  Result Date: 04/18/2019 CLINICAL DATA:  Right shoulder pain. EXAM: CHEST - 2 VIEW COMPARISON:  October 25, 2014 FINDINGS: There is no evidence of acute infiltrate, pleural effusion or pneumothorax. The heart size and mediastinal contours are within normal limits. There is moderate severity calcification of the aortic arch. The visualized skeletal structures are unremarkable. IMPRESSION: No active cardiopulmonary disease. Electronically Signed   By: Aram Candela M.D.   On: 04/18/2019 01:54   DG Shoulder Right  Result Date: 04/18/2019 CLINICAL DATA:  Right shoulder  pain, atraumatic EXAM: RIGHT SHOULDER - 2+ VIEW COMPARISON:  None. FINDINGS: Hazy calcification of the rotator cuff. No fracture or malalignment. No degenerative spurring. IMPRESSION: Calcific tendinitis of the rotator cuff. Electronically Signed   By: Monte Fantasia M.D.   On: 04/18/2019 05:09    Procedures Procedures (including critical care time)  Medications Ordered in ED Medications  sodium chloride flush (NS) 0.9 % injection 3 mL (has no administration in time range)  oxyCODONE-acetaminophen (PERCOCET/ROXICET) 5-325 MG per tablet 1 tablet (1 tablet Oral Given 04/18/19 0430)  methocarbamol (ROBAXIN) tablet 500 mg (500 mg Oral Given 04/18/19 0430)    ED Course  I have reviewed the triage vital signs and the nursing notes.  Pertinent labs & imaging results that were available during my care of the patient were reviewed by me and considered in my medical decision making (see chart for details).    MDM Rules/Calculators/A&P  64 year old female presenting to the ED with right shoulder pain.  This woke her from sleep last evening.  Denies any injury, trauma, or falls.  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  She is right-hand dominant.  She is not had any chest pain or shortness of breath.  She is afebrile nontoxic in appearance here.  Does have some  tenderness along the right lateral shoulder, no bony deformity or signs of dislocation noted.  Her arm is neurovascular intact with normal grip strength.  Pain is improved with arm resting by her side or across her abdomen.  EKG is nonischemic.  Labs are reassuring, troponin negative.  Repeat in process.  Chest x-ray is clear.  Pain seems very musculoskeletal, possibly tendinitis from overuse given her job duties.  Will obtain dedicated shoulder films.  Pain medication given here.  X-ray confirms calcific tendinitis of right shoulder.  No bony deformities noted.  Repeat troponin is negative.  I feel the tendinitis is likely source of her pain.  I do not think this represents atypical ACS, PE, dissection, acute cardiac event.  Results discussed with patient and husband, they acknowledged understanding.  We will plan to discharge home with symptomatic care.  They will be given orthopedic follow-up if any ongoing issues, can also follow-up with PCP.  Work note provided.  Return here for any new/acute changes.  Final Clinical Impression(s) / ED Diagnoses Final diagnoses:  Calcific tendonitis of right shoulder    Rx / DC Orders ED Discharge Orders    None       Larene Pickett, PA-C 04/18/19 0606    Mesner, Corene Cornea, MD 04/18/19 [id number]

## 2019-04-22 DIAGNOSIS — N898 Other specified noninflammatory disorders of vagina: Secondary | ICD-10-CM | POA: Diagnosis not present

## 2019-04-22 DIAGNOSIS — N813 Complete uterovaginal prolapse: Secondary | ICD-10-CM | POA: Diagnosis not present

## 2019-04-22 DIAGNOSIS — Z124 Encounter for screening for malignant neoplasm of cervix: Secondary | ICD-10-CM | POA: Diagnosis not present

## 2019-04-25 ENCOUNTER — Other Ambulatory Visit (HOSPITAL_COMMUNITY): Payer: Self-pay | Admitting: Family Medicine

## 2019-04-25 MED FILL — JANUVIA 100 MG TABLET: 100 | 30 days supply | Qty: 30 | Fill #2

## 2019-04-25 MED FILL — LISINOPRIL-HCTZ 20-12.5 MG: 20-12.5 | 90 days supply | Qty: 90 | Fill #2

## 2019-04-30 MED FILL — BYDUREON BCise 2 MG/0.85ML: 2 | 28 days supply | Qty: 3 | Fill #1

## 2019-05-01 MED FILL — FREESTYLE LITE TEST STRIP: 83 days supply | Qty: 250 | Fill #1

## 2019-05-01 MED FILL — FREESTYLE LANCETS: 66 days supply | Qty: 200 | Fill #1

## 2019-05-06 DIAGNOSIS — N898 Other specified noninflammatory disorders of vagina: Secondary | ICD-10-CM | POA: Diagnosis not present

## 2019-05-06 DIAGNOSIS — R350 Frequency of micturition: Secondary | ICD-10-CM | POA: Diagnosis not present

## 2019-05-06 DIAGNOSIS — N813 Complete uterovaginal prolapse: Secondary | ICD-10-CM | POA: Diagnosis not present

## 2019-05-24 DIAGNOSIS — H538 Other visual disturbances: Secondary | ICD-10-CM | POA: Diagnosis not present

## 2019-05-24 DIAGNOSIS — H2511 Age-related nuclear cataract, right eye: Secondary | ICD-10-CM | POA: Diagnosis not present

## 2019-05-29 ENCOUNTER — Other Ambulatory Visit (HOSPITAL_COMMUNITY): Payer: Self-pay | Admitting: Family Medicine

## 2019-05-29 MED FILL — JARDIANCE 25 MG TABLET: 25 | 90 days supply | Qty: 90 | Fill #0

## 2019-05-29 MED FILL — BYDUREON BCise 2 MG/0.85ML: 2 | 28 days supply | Qty: 3 | Fill #2

## 2019-05-31 ENCOUNTER — Other Ambulatory Visit (HOSPITAL_COMMUNITY): Payer: Self-pay | Admitting: Family Medicine

## 2019-05-31 DIAGNOSIS — I10 Essential (primary) hypertension: Secondary | ICD-10-CM | POA: Diagnosis not present

## 2019-05-31 DIAGNOSIS — E7800 Pure hypercholesterolemia, unspecified: Secondary | ICD-10-CM | POA: Diagnosis not present

## 2019-05-31 DIAGNOSIS — E119 Type 2 diabetes mellitus without complications: Secondary | ICD-10-CM | POA: Diagnosis not present

## 2019-05-31 DIAGNOSIS — E669 Obesity, unspecified: Secondary | ICD-10-CM | POA: Diagnosis not present

## 2019-05-31 MED FILL — METOPROLOL TARTRATE 25 MG T: 25 | 90 days supply | Qty: 90 | Fill #0

## 2019-06-05 DIAGNOSIS — H25813 Combined forms of age-related cataract, bilateral: Secondary | ICD-10-CM | POA: Diagnosis not present

## 2019-06-05 DIAGNOSIS — E119 Type 2 diabetes mellitus without complications: Secondary | ICD-10-CM | POA: Diagnosis not present

## 2019-06-05 MED FILL — PROLENSA 0.07% EYE DROPS: 0.07 | 60 days supply | Qty: 3 | Fill #0

## 2019-06-05 MED FILL — PREDNISOLONE AC 1% EYE DROP: 1 | 50 days supply | Qty: 10 | Fill #0

## 2019-06-14 DIAGNOSIS — H25811 Combined forms of age-related cataract, right eye: Secondary | ICD-10-CM | POA: Diagnosis not present

## 2019-06-26 MED FILL — JANUVIA 100 MG TABLET: 100 | 30 days supply | Qty: 30 | Fill #4

## 2019-06-26 MED FILL — BYDUREON BCise 2 MG/0.85ML: 2 | 28 days supply | Qty: 3 | Fill #3

## 2019-07-15 MED FILL — PREDNISOLONE AC 1% EYE DROP: 1 | 50 days supply | Qty: 10 | Fill #1

## 2019-07-17 ENCOUNTER — Other Ambulatory Visit (HOSPITAL_COMMUNITY): Payer: Self-pay

## 2019-07-17 DIAGNOSIS — Z4689 Encounter for fitting and adjustment of other specified devices: Secondary | ICD-10-CM | POA: Diagnosis not present

## 2019-07-17 DIAGNOSIS — E119 Type 2 diabetes mellitus without complications: Secondary | ICD-10-CM | POA: Diagnosis not present

## 2019-07-17 DIAGNOSIS — N814 Uterovaginal prolapse, unspecified: Secondary | ICD-10-CM | POA: Diagnosis not present

## 2019-07-17 DIAGNOSIS — N898 Other specified noninflammatory disorders of vagina: Secondary | ICD-10-CM | POA: Diagnosis not present

## 2019-07-17 MED FILL — ESTRADIOL 0.1 MG/GM CRM: 0.1 | 30 days supply | Qty: 43 | Fill #0

## 2019-07-27 MED FILL — BYDUREON BCise 2 MG/0.85ML: 2 | 28 days supply | Qty: 3 | Fill #4

## 2019-07-27 MED FILL — LISINOPRIL-HCTZ 20-12.5 MG: 20-12.5 | 90 days supply | Qty: 90 | Fill #0

## 2019-07-29 ENCOUNTER — Other Ambulatory Visit (HOSPITAL_COMMUNITY): Payer: Self-pay | Admitting: Family Medicine

## 2019-07-29 MED FILL — JANUVIA 100 MG TABLET: 100 | 30 days supply | Qty: 30 | Fill #0

## 2019-08-26 MED FILL — BYDUREON BCise 2 MG/0.85ML: 2 | 28 days supply | Qty: 3 | Fill #5

## 2019-08-26 MED FILL — FREESTYLE LANCETS: 66 days supply | Qty: 200 | Fill #2

## 2019-08-26 MED FILL — JANUVIA 100 MG TABLET: 100 | 30 days supply | Qty: 30 | Fill #1

## 2019-08-26 MED FILL — METOPROLOL TARTRATE 25 MG T: 25 | 90 days supply | Qty: 90 | Fill #1

## 2019-08-26 MED FILL — JARDIANCE 25 MG TABLET: 25 | 90 days supply | Qty: 90 | Fill #1

## 2019-08-26 MED FILL — METFORMIN HCL 500 MG TABS: 500 | 90 days supply | Qty: 360 | Fill #0

## 2019-08-26 MED FILL — FREESTYLE LITE TEST STRIP: 83 days supply | Qty: 250 | Fill #2

## 2019-09-23 MED FILL — JANUVIA 100 MG TABLET: 100 | 30 days supply | Qty: 30 | Fill #2

## 2019-09-23 MED FILL — BYDUREON BCise 2 MG/0.85ML: 2 | 28 days supply | Qty: 3 | Fill #0

## 2019-10-21 ENCOUNTER — Other Ambulatory Visit (HOSPITAL_COMMUNITY): Payer: Self-pay | Admitting: Ophthalmology

## 2019-10-21 MED FILL — PREDNISOLONE AC 1% EYE DROP: 1 | 90 days supply | Qty: 5 | Fill #0

## 2019-10-21 MED FILL — LISINOPRIL-HCTZ 20-12.5 MG: 20-12.5 | 90 days supply | Qty: 90 | Fill #1

## 2019-10-21 MED FILL — JANUVIA 100 MG TABLET: 100 | 30 days supply | Qty: 30 | Fill #3

## 2019-10-22 ENCOUNTER — Other Ambulatory Visit: Payer: Self-pay | Admitting: Family Medicine

## 2019-10-22 DIAGNOSIS — Z1231 Encounter for screening mammogram for malignant neoplasm of breast: Secondary | ICD-10-CM

## 2019-10-22 MED FILL — BYDUREON BCise 2 MG/0.85ML: 2 | 28 days supply | Qty: 3 | Fill #1

## 2019-10-30 DIAGNOSIS — N814 Uterovaginal prolapse, unspecified: Secondary | ICD-10-CM | POA: Diagnosis not present

## 2019-10-30 DIAGNOSIS — Z4689 Encounter for fitting and adjustment of other specified devices: Secondary | ICD-10-CM | POA: Diagnosis not present

## 2019-10-30 DIAGNOSIS — E1169 Type 2 diabetes mellitus with other specified complication: Secondary | ICD-10-CM | POA: Diagnosis not present

## 2019-11-19 MED FILL — METOPROLOL TARTRATE 25 MG T: 25 | 90 days supply | Qty: 90 | Fill #2

## 2019-11-19 MED FILL — JARDIANCE 25 MG TABLET: 25 | 90 days supply | Qty: 90 | Fill #2

## 2019-11-19 MED FILL — JANUVIA 100 MG TABLET: 100 | 30 days supply | Qty: 30 | Fill #4

## 2019-11-19 MED FILL — METFORMIN HCL 500 MG TABS: 500 | 90 days supply | Qty: 360 | Fill #1

## 2019-11-19 MED FILL — BYDUREON BCise 2 MG/0.85ML: 2 | 28 days supply | Qty: 3 | Fill #2

## 2019-11-28 DIAGNOSIS — I5032 Chronic diastolic (congestive) heart failure: Secondary | ICD-10-CM | POA: Diagnosis present

## 2019-12-02 ENCOUNTER — Ambulatory Visit: Payer: 59

## 2019-12-04 ENCOUNTER — Other Ambulatory Visit: Payer: Self-pay

## 2019-12-04 ENCOUNTER — Ambulatory Visit
Admission: RE | Admit: 2019-12-04 | Discharge: 2019-12-04 | Disposition: A | Discharge status: Home / Self Care | Payer: 59 | Source: Ambulatory Visit | Attending: Family Medicine | Admitting: Family Medicine

## 2019-12-04 DIAGNOSIS — Z1231 Encounter for screening mammogram for malignant neoplasm of breast: Secondary | ICD-10-CM

## 2019-12-10 ENCOUNTER — Other Ambulatory Visit: Payer: Self-pay | Admitting: Family Medicine

## 2019-12-10 DIAGNOSIS — R928 Other abnormal and inconclusive findings on diagnostic imaging of breast: Secondary | ICD-10-CM

## 2019-12-16 MED FILL — BYDUREON BCise 2 MG/0.85ML: 2 | 28 days supply | Qty: 3 | Fill #3

## 2019-12-16 MED FILL — ESTRADIOL 0.1 MG/GM CREA: 0.1 | 90 days supply | Qty: 43 | Fill #1

## 2019-12-17 ENCOUNTER — Ambulatory Visit: Payer: 59

## 2019-12-17 ENCOUNTER — Other Ambulatory Visit: Payer: Self-pay

## 2019-12-17 ENCOUNTER — Ambulatory Visit
Admission: RE | Admit: 2019-12-17 | Discharge: 2019-12-17 | Disposition: A | Discharge status: Home / Self Care | Payer: 59 | Source: Ambulatory Visit | Attending: Family Medicine | Admitting: Family Medicine

## 2019-12-17 DIAGNOSIS — R928 Other abnormal and inconclusive findings on diagnostic imaging of breast: Secondary | ICD-10-CM | POA: Diagnosis not present

## 2019-12-19 MED FILL — JANUVIA 100 MG TABLET: 100 | 30 days supply | Qty: 30 | Fill #5

## 2020-01-01 DIAGNOSIS — E669 Obesity, unspecified: Secondary | ICD-10-CM | POA: Diagnosis not present

## 2020-01-01 DIAGNOSIS — E7800 Pure hypercholesterolemia, unspecified: Secondary | ICD-10-CM | POA: Diagnosis not present

## 2020-01-01 DIAGNOSIS — Z23 Encounter for immunization: Secondary | ICD-10-CM | POA: Diagnosis not present

## 2020-01-01 DIAGNOSIS — E1169 Type 2 diabetes mellitus with other specified complication: Secondary | ICD-10-CM | POA: Diagnosis not present

## 2020-01-01 DIAGNOSIS — Z0000 Encounter for general adult medical examination without abnormal findings: Secondary | ICD-10-CM | POA: Diagnosis not present

## 2020-01-01 DIAGNOSIS — I10 Essential (primary) hypertension: Secondary | ICD-10-CM | POA: Diagnosis not present

## 2020-01-13 MED FILL — BYDUREON BCise 2 MG/0.85ML: 2 | 28 days supply | Qty: 3 | Fill #4

## 2020-01-14 DIAGNOSIS — H26491 Other secondary cataract, right eye: Secondary | ICD-10-CM | POA: Diagnosis not present

## 2020-01-14 DIAGNOSIS — Z961 Presence of intraocular lens: Secondary | ICD-10-CM | POA: Diagnosis not present

## 2020-01-14 DIAGNOSIS — E119 Type 2 diabetes mellitus without complications: Secondary | ICD-10-CM | POA: Diagnosis not present

## 2020-01-14 DIAGNOSIS — H25812 Combined forms of age-related cataract, left eye: Secondary | ICD-10-CM | POA: Diagnosis not present

## 2020-02-03 MED FILL — JANUVIA 100 MG TABLET: 100 | 30 days supply | Qty: 30 | Fill #6

## 2020-02-03 MED FILL — LISINOPRIL-HCTZ 20-12.5 MG: 20-12.5 | 90 days supply | Qty: 90 | Fill #2

## 2020-02-10 MED FILL — BYDUREON BCise 2 MG/0.85ML: 2 | 28 days supply | Qty: 3 | Fill #5

## 2020-03-02 MED FILL — JARDIANCE 25 MG TABLET: 25 | 90 days supply | Qty: 90 | Fill #3

## 2020-03-02 MED FILL — JANUVIA 100 MG TABLET: 100 | 30 days supply | Qty: 30 | Fill #7

## 2020-03-02 MED FILL — METFORMIN HCL 500 MG TABS: 500 | 90 days supply | Qty: 360 | Fill #2

## 2020-03-06 ENCOUNTER — Other Ambulatory Visit (HOSPITAL_COMMUNITY): Payer: Self-pay | Admitting: Family Medicine

## 2020-03-06 MED FILL — TRULICITY 0.75 MG/0.5 ML PE: 0.75 | 28 days supply | Qty: 2 | Fill #0

## 2020-03-31 MED FILL — JANUVIA 100 MG TABLET: 100 | 30 days supply | Qty: 30 | Fill #8

## 2020-03-31 MED FILL — TRULICITY 0.75 MG/0.5 ML PE: 0.75 | 28 days supply | Qty: 2 | Fill #1

## 2020-04-14 MED FILL — Metoprolol Tartrate Tab 25 MG: ORAL | 90 days supply | Qty: 90 | Fill #0 | Status: AC

## 2020-04-15 ENCOUNTER — Other Ambulatory Visit (HOSPITAL_COMMUNITY): Payer: Self-pay

## 2020-04-29 MED FILL — Lisinopril & Hydrochlorothiazide Tab 20-12.5 MG: ORAL | 90 days supply | Qty: 90 | Fill #0 | Status: CN

## 2020-04-29 MED FILL — Sitagliptin Phosphate Tab 100 MG (Base Equiv): ORAL | 30 days supply | Qty: 30 | Fill #0 | Status: CN

## 2020-04-29 MED FILL — Dulaglutide Soln Auto-injector 0.75 MG/0.5ML: SUBCUTANEOUS | 28 days supply | Qty: 2 | Fill #0 | Status: CN

## 2020-04-30 ENCOUNTER — Other Ambulatory Visit (HOSPITAL_COMMUNITY): Payer: Self-pay

## 2020-05-06 ENCOUNTER — Other Ambulatory Visit (HOSPITAL_COMMUNITY): Payer: Self-pay

## 2020-05-08 ENCOUNTER — Other Ambulatory Visit (HOSPITAL_COMMUNITY): Payer: Self-pay

## 2020-05-11 ENCOUNTER — Other Ambulatory Visit (HOSPITAL_COMMUNITY): Payer: Self-pay

## 2020-05-12 ENCOUNTER — Other Ambulatory Visit (HOSPITAL_COMMUNITY): Payer: Self-pay

## 2020-05-12 MED FILL — Lisinopril & Hydrochlorothiazide Tab 20-12.5 MG: ORAL | 7 days supply | Qty: 7 | Fill #0 | Status: AC

## 2020-05-12 MED FILL — Lisinopril & Hydrochlorothiazide Tab 20-12.5 MG: ORAL | 83 days supply | Qty: 83 | Fill #0 | Status: AC

## 2020-05-13 ENCOUNTER — Other Ambulatory Visit (HOSPITAL_COMMUNITY): Payer: Self-pay

## 2020-05-14 ENCOUNTER — Other Ambulatory Visit (HOSPITAL_COMMUNITY): Payer: Self-pay

## 2020-05-15 ENCOUNTER — Other Ambulatory Visit (HOSPITAL_COMMUNITY): Payer: Self-pay

## 2020-05-18 ENCOUNTER — Other Ambulatory Visit (HOSPITAL_COMMUNITY): Payer: Self-pay

## 2020-05-19 ENCOUNTER — Other Ambulatory Visit (HOSPITAL_COMMUNITY): Payer: Self-pay

## 2020-05-20 ENCOUNTER — Other Ambulatory Visit (HOSPITAL_COMMUNITY): Payer: Self-pay

## 2020-05-21 ENCOUNTER — Other Ambulatory Visit (HOSPITAL_COMMUNITY): Payer: Self-pay

## 2020-05-21 MED ORDER — OZEMPIC (0.25 OR 0.5 MG/DOSE) 2 MG/1.5ML ~~LOC~~ SOPN
0.5000 mg | PEN_INJECTOR | SUBCUTANEOUS | 12 refills | Status: DC
  Filled 2020-05-21 (×2): qty 1.5, 28d supply, fill #0

## 2020-05-21 MED FILL — Dulaglutide Soln Auto-injector 0.75 MG/0.5ML: SUBCUTANEOUS | 28 days supply | Qty: 2 | Fill #0 | Status: AC

## 2020-06-02 ENCOUNTER — Other Ambulatory Visit (HOSPITAL_COMMUNITY): Payer: Self-pay

## 2020-06-02 MED FILL — Sitagliptin Phosphate Tab 100 MG (Base Equiv): ORAL | 30 days supply | Qty: 30 | Fill #0 | Status: AC

## 2020-06-03 ENCOUNTER — Other Ambulatory Visit (HOSPITAL_COMMUNITY): Payer: Self-pay

## 2020-06-03 MED ORDER — METFORMIN HCL 500 MG PO TABS
1000.0000 mg | ORAL_TABLET | Freq: Two times a day (BID) | ORAL | 0 refills | Status: DC
  Filled 2020-06-03: qty 360, 90d supply, fill #0

## 2020-06-04 ENCOUNTER — Other Ambulatory Visit (HOSPITAL_COMMUNITY): Payer: Self-pay

## 2020-06-18 ENCOUNTER — Other Ambulatory Visit (HOSPITAL_COMMUNITY): Payer: Self-pay

## 2020-06-18 MED ORDER — ACCU-CHEK GUIDE VI STRP: ORAL_STRIP | 3 refills | Status: DC

## 2020-06-18 MED ORDER — ACCU-CHEK GUIDE W/DEVICE KIT: PACK | 0 refills | Status: DC

## 2020-06-18 MED ORDER — LANCETS MISC. MISC
3 refills | Status: AC
  Filled 2020-06-18: qty 300, 100d supply, fill #0

## 2020-06-23 ENCOUNTER — Other Ambulatory Visit (HOSPITAL_COMMUNITY): Payer: Self-pay

## 2020-06-24 ENCOUNTER — Other Ambulatory Visit (HOSPITAL_COMMUNITY): Payer: Self-pay

## 2020-06-30 ENCOUNTER — Other Ambulatory Visit (HOSPITAL_COMMUNITY): Payer: Self-pay

## 2020-06-30 MED ORDER — FREESTYLE LITE W/DEVICE KIT
PACK | 0 refills | Status: DC
  Filled 2020-06-30: qty 1, 1d supply, fill #0

## 2020-06-30 MED ORDER — ROSUVASTATIN CALCIUM 10 MG PO TABS
10.0000 mg | ORAL_TABLET | Freq: Every day | ORAL | 12 refills | Status: DC
  Filled 2020-06-30: qty 30, 30d supply, fill #0
  Filled 2020-08-02: qty 30, 30d supply, fill #1
  Filled 2020-09-03: qty 30, 30d supply, fill #2
  Filled 2020-09-29: qty 30, 30d supply, fill #3
  Filled 2020-11-01: qty 30, 30d supply, fill #4
  Filled 2020-12-08: qty 30, 30d supply, fill #5
  Filled 2021-01-05: qty 30, 30d supply, fill #6
  Filled 2021-02-02: qty 30, 30d supply, fill #7
  Filled 2021-03-06: qty 30, 30d supply, fill #8
  Filled 2021-04-13: qty 30, 30d supply, fill #9
  Filled 2021-05-15: qty 30, 30d supply, fill #10
  Filled 2021-06-11: qty 30, 30d supply, fill #11

## 2020-06-30 MED ORDER — FREESTYLE LANCETS MISC
3 refills | Status: AC
  Filled 2020-06-30: qty 300, 90d supply, fill #0
  Filled 2020-11-16: qty 300, 90d supply, fill #1

## 2020-06-30 MED ORDER — FREESTYLE LITE TEST VI STRP
ORAL_STRIP | 3 refills | Status: DC
  Filled 2020-06-30: qty 250, 83d supply, fill #0
  Filled 2020-11-16: qty 250, 83d supply, fill #1
  Filled 2021-05-19: qty 250, 83d supply, fill #2

## 2020-07-01 ENCOUNTER — Other Ambulatory Visit (HOSPITAL_COMMUNITY): Payer: Self-pay

## 2020-07-01 MED ORDER — JANUVIA 100 MG PO TABS
100.0000 mg | ORAL_TABLET | Freq: Every day | ORAL | 5 refills | Status: DC
  Filled 2020-07-01: qty 30, 30d supply, fill #0
  Filled 2020-08-02: qty 30, 30d supply, fill #1
  Filled 2020-09-03: qty 30, 30d supply, fill #2
  Filled 2020-09-29: qty 30, 30d supply, fill #3
  Filled 2020-11-16: qty 30, 30d supply, fill #4
  Filled 2020-12-13: qty 30, 30d supply, fill #5

## 2020-07-01 MED ORDER — JARDIANCE 25 MG PO TABS
25.0000 mg | ORAL_TABLET | Freq: Every day | ORAL | 5 refills | Status: DC
  Filled 2020-07-01: qty 30, 30d supply, fill #0
  Filled 2020-08-02: qty 30, 30d supply, fill #1
  Filled 2020-09-03: qty 30, 30d supply, fill #2
  Filled 2020-09-29: qty 30, 30d supply, fill #3
  Filled 2020-11-01: qty 30, 30d supply, fill #4
  Filled 2020-12-08: qty 30, 30d supply, fill #5

## 2020-07-02 ENCOUNTER — Other Ambulatory Visit (HOSPITAL_COMMUNITY): Payer: Self-pay

## 2020-07-03 ENCOUNTER — Other Ambulatory Visit (HOSPITAL_COMMUNITY): Payer: Self-pay

## 2020-07-19 ENCOUNTER — Other Ambulatory Visit: Payer: Self-pay

## 2020-07-19 ENCOUNTER — Emergency Department (HOSPITAL_COMMUNITY): Payer: HMO

## 2020-07-19 ENCOUNTER — Observation Stay (HOSPITAL_BASED_OUTPATIENT_CLINIC_OR_DEPARTMENT_OTHER): Payer: HMO

## 2020-07-19 ENCOUNTER — Encounter (HOSPITAL_COMMUNITY): Payer: Self-pay | Admitting: Emergency Medicine

## 2020-07-19 ENCOUNTER — Observation Stay (HOSPITAL_COMMUNITY)
Admission: EM | Admit: 2020-07-19 | Discharge: 2020-07-20 | Disposition: A | Discharge status: Home / Self Care | Payer: HMO | Attending: Student | Admitting: Student

## 2020-07-19 DIAGNOSIS — E119 Type 2 diabetes mellitus without complications: Secondary | ICD-10-CM | POA: Insufficient documentation

## 2020-07-19 DIAGNOSIS — Z20822 Contact with and (suspected) exposure to covid-19: Secondary | ICD-10-CM | POA: Insufficient documentation

## 2020-07-19 DIAGNOSIS — I639 Cerebral infarction, unspecified: Principal | ICD-10-CM | POA: Insufficient documentation

## 2020-07-19 DIAGNOSIS — I110 Hypertensive heart disease with heart failure: Secondary | ICD-10-CM | POA: Insufficient documentation

## 2020-07-19 DIAGNOSIS — Z87891 Personal history of nicotine dependence: Secondary | ICD-10-CM | POA: Insufficient documentation

## 2020-07-19 DIAGNOSIS — Z79899 Other long term (current) drug therapy: Secondary | ICD-10-CM | POA: Diagnosis not present

## 2020-07-19 DIAGNOSIS — Z7984 Long term (current) use of oral hypoglycemic drugs: Secondary | ICD-10-CM | POA: Insufficient documentation

## 2020-07-19 DIAGNOSIS — I6389 Other cerebral infarction: Secondary | ICD-10-CM | POA: Diagnosis not present

## 2020-07-19 DIAGNOSIS — E118 Type 2 diabetes mellitus with unspecified complications: Secondary | ICD-10-CM | POA: Diagnosis not present

## 2020-07-19 DIAGNOSIS — I10 Essential (primary) hypertension: Secondary | ICD-10-CM | POA: Diagnosis present

## 2020-07-19 DIAGNOSIS — I5032 Chronic diastolic (congestive) heart failure: Secondary | ICD-10-CM | POA: Insufficient documentation

## 2020-07-19 DIAGNOSIS — E785 Hyperlipidemia, unspecified: Secondary | ICD-10-CM | POA: Diagnosis present

## 2020-07-19 DIAGNOSIS — IMO0002 Reserved for concepts with insufficient information to code with codable children: Secondary | ICD-10-CM | POA: Diagnosis present

## 2020-07-19 DIAGNOSIS — E1165 Type 2 diabetes mellitus with hyperglycemia: Secondary | ICD-10-CM | POA: Diagnosis present

## 2020-07-19 DIAGNOSIS — R42 Dizziness and giddiness: Secondary | ICD-10-CM | POA: Diagnosis present

## 2020-07-19 LAB — I-STAT CHEM 8, ED
BUN: 12 mg/dL (ref 8–23)
Calcium, Ion: 1.01 mmol/L — ABNORMAL LOW (ref 1.15–1.40)
Chloride: 109 mmol/L (ref 98–111)
Creatinine, Ser: 0.6 mg/dL (ref 0.44–1.00)
Glucose, Bld: 185 mg/dL — ABNORMAL HIGH (ref 70–99)
HCT: 45 % (ref 36.0–46.0)
Hemoglobin: 15.3 g/dL — ABNORMAL HIGH (ref 12.0–15.0)
Potassium: 3.5 mmol/L (ref 3.5–5.1)
Sodium: 141 mmol/L (ref 135–145)
TCO2: 24 mmol/L (ref 22–32)

## 2020-07-19 LAB — DIFFERENTIAL
Abs Immature Granulocytes: 0.03 10*3/uL (ref 0.00–0.07)
Basophils Absolute: 0 10*3/uL (ref 0.0–0.1)
Basophils Relative: 1 %
Eosinophils Absolute: 0.2 10*3/uL (ref 0.0–0.5)
Eosinophils Relative: 2 %
Immature Granulocytes: 0 %
Lymphocytes Relative: 22 %
Lymphs Abs: 1.7 10*3/uL (ref 0.7–4.0)
Monocytes Absolute: 0.4 10*3/uL (ref 0.1–1.0)
Monocytes Relative: 5 %
Neutro Abs: 5.5 10*3/uL (ref 1.7–7.7)
Neutrophils Relative %: 70 %

## 2020-07-19 LAB — COMPREHENSIVE METABOLIC PANEL
ALT: 21 U/L (ref 0–44)
AST: 21 U/L (ref 15–41)
Albumin: 4.2 g/dL (ref 3.5–5.0)
Alkaline Phosphatase: 99 U/L (ref 38–126)
Anion gap: 10 (ref 5–15)
BUN: 11 mg/dL (ref 8–23)
CO2: 25 mmol/L (ref 22–32)
Calcium: 9.5 mg/dL (ref 8.9–10.3)
Chloride: 105 mmol/L (ref 98–111)
Creatinine, Ser: 0.74 mg/dL (ref 0.44–1.00)
GFR, Estimated: >60 mL/min (ref >60)
Glucose, Bld: 188 mg/dL — ABNORMAL HIGH (ref 70–99)
Potassium: 3.5 mmol/L (ref 3.5–5.1)
Sodium: 140 mmol/L (ref 135–145)
Total Bilirubin: 0.7 mg/dL (ref 0.3–1.2)
Total Protein: 7.8 g/dL (ref 6.5–8.1)

## 2020-07-19 LAB — CBC
HCT: 45.3 % (ref 36.0–46.0)
Hemoglobin: 13.2 g/dL (ref 12.0–15.0)
MCH: 23.4 pg — ABNORMAL LOW (ref 26.0–34.0)
MCHC: 29.1 g/dL — ABNORMAL LOW (ref 30.0–36.0)
MCV: 80.3 fL (ref 80.0–100.0)
Platelets: 248 10*3/uL (ref 150–400)
RBC: 5.64 MIL/uL — ABNORMAL HIGH (ref 3.87–5.11)
RDW: 14.7 % (ref 11.5–15.5)
WBC: 7.9 10*3/uL (ref 4.0–10.5)
nRBC: 0 % (ref 0.0–0.2)

## 2020-07-19 LAB — ECHOCARDIOGRAM COMPLETE
AR max vel: 2.31 cm2
AV Area VTI: 2.33 cm2
AV Area mean vel: 2.17 cm2
AV Mean grad: 3 mmHg
AV Peak grad: 6.2 mmHg
Ao pk vel: 1.24 m/s
Area-P 1/2: 4.8 cm2
S' Lateral: 2.5 cm

## 2020-07-19 LAB — PROTIME-INR
INR: 1 (ref 0.8–1.2)
Prothrombin Time: 12.8 seconds (ref 11.4–15.2)

## 2020-07-19 LAB — CBG MONITORING, ED: Glucose-Capillary: 183 mg/dL — ABNORMAL HIGH (ref 70–99)

## 2020-07-19 LAB — HIV ANTIBODY (ROUTINE TESTING W REFLEX): HIV Screen 4th Generation wRfx: NONREACTIVE

## 2020-07-19 LAB — APTT: aPTT: 21 seconds — ABNORMAL LOW (ref 24–36)

## 2020-07-19 MED ORDER — ACETAMINOPHEN 325 MG PO TABS: 650.0000 mg | ORAL_TABLET | ORAL | Status: DC | PRN

## 2020-07-19 MED ORDER — SENNOSIDES-DOCUSATE SODIUM 8.6-50 MG PO TABS: 1.0000 | ORAL_TABLET | Freq: Every evening | ORAL | Status: DC | PRN

## 2020-07-19 MED ORDER — ENOXAPARIN SODIUM 40 MG/0.4ML IJ SOSY
40.0000 mg | PREFILLED_SYRINGE | INTRAMUSCULAR | Status: DC
  Administered 2020-07-19: 40 mg via SUBCUTANEOUS
  Filled 2020-07-19: qty 0.4

## 2020-07-19 MED ORDER — SODIUM CHLORIDE 0.9% FLUSH
3.0000 mL | Freq: Once | INTRAVENOUS | Status: DC
Start: 2020-07-19 — End: 2020-07-20

## 2020-07-19 MED ORDER — STROKE: EARLY STAGES OF RECOVERY BOOK: Freq: Once | Status: DC

## 2020-07-19 MED ORDER — ACETAMINOPHEN 160 MG/5ML PO SOLN: 650.0000 mg | ORAL | Status: DC | PRN

## 2020-07-19 MED ORDER — CLOPIDOGREL BISULFATE 75 MG PO TABS
75.0000 mg | ORAL_TABLET | Freq: Every day | ORAL | Status: DC
  Administered 2020-07-19 – 2020-07-20 (×2): 75 mg via ORAL
  Filled 2020-07-19 (×2): qty 1

## 2020-07-19 MED ORDER — IOHEXOL 350 MG/ML SOLN
100.0000 mL | Freq: Once | INTRAVENOUS | Status: AC | PRN
  Administered 2020-07-19: 100 mL via INTRAVENOUS

## 2020-07-19 MED ORDER — ACETAMINOPHEN 650 MG RE SUPP: 650.0000 mg | RECTAL | Status: DC | PRN

## 2020-07-19 NOTE — Consult Note (Signed)
Neurology Consultation  Reason for Consult: Stroke. Referring Physician: A. Rogers Blocker, MD.   CC: Stroke on MRI brain.   History is obtained from: Patient, chart.   HPI: Brittany [name] is a 65 y.o. female with PMHx of HTN, DM II, HLD, and CdHF. Patient presented to [facility] 4 hours ago with c/o being off balance. Husband reported no weakness of a limb, no facial droop, or dysarthria. LKW 07/18/20 at 0930 hours.   Per patient, she went to bed last pm at 2130. She woke up in middle of night to go to the bathroom. She felt a little wobbly with walking, but was able to finish trip. At 0930, she awoke to go to bathroom. At that point, she could not walk because she was so wobbly. Her husband had to assist her. She was light headed but not dizzy. No weakness of a particular extremity, no facial droop, no dysarthria, no confusion, no numbness or tingling, no dysphagia, nor HA. Because of clarification of time, her LKW is 2130 last pm.   Patient with no personal history of stroke but old strokes found on brain imaging. She was unaware. No FMHx of stroke.   Workup thus far reveals: Glucose 185, INR 1, aPTT 21, CTH negative for acute, CTA head and neck without LVO, and MRI brain + small acute infarcts in the dorsal left paramidline pons + old strokes and + chronic small vessel disease. Patient states her last HbA1c was 7.   Neurology asked to consult for stroke.   ROS: A robust ROS was performed and is negative except as noted in the HPI.   Past Medical History:  Diagnosis Date   Diabetes mellitus without complication ([place])    Hyperlipidemia    Hypertension   CdHF  Family History  Problem Relation Age of Onset   Heart block Mother    Asthma Other    Hypertension Other    Hyperlipidemia Other    Diabetes Other    Social History:   reports that she has quit smoking. She has never used smokeless tobacco. She reports that she does not drink alcohol and does not use drugs.  Medications  Current  Facility-Administered Medications:    sodium chloride flush (NS) 0.9 % injection 3 mL, 3 mL, Intravenous, Once, Gareth Morgan, MD  Current Outpatient Medications:    aspirin 81 MG tablet, Take 81 mg by mouth daily., Disp: , Rfl:    Blood Glucose Monitoring Suppl (ACCU-CHEK GUIDE) w/Device KIT, use for blood sugar check fingerstick three times a day DX E11.69, Disp: 1 kit, Rfl: 0   Blood Glucose Monitoring Suppl (FREESTYLE LITE) w/Device KIT, Use as directed three times daily, Disp: 1 kit, Rfl: 0   Dulaglutide 0.75 MG/0.5ML SOPN, INJECT 1 PEN INTO THE SKIN ONCE A WEEK., Disp: 2 mL, Rfl: 12   empagliflozin (JARDIANCE) 25 MG TABS tablet, Take 1 tablet (25 mg total) by mouth daily., Disp: 30 tablet, Rfl: 5   Exenatide ER (BYDUREON) 2 MG SUSR, Inject 2 mg into the skin once a week. Saturday, Disp: , Rfl:    glucose blood (ACCU-CHEK GUIDE) test strip, Use 1 strip finger stick up to three times a day to check blood sugar DXE11.69, Disp: 300 strip, Rfl: 3   glucose blood (FREESTYLE LITE) test strip, Use as directed up to 3 times daily, Disp: 300 strip, Rfl: 3   HYDROcodone-acetaminophen (NORCO/VICODIN) 5-325 MG tablet, Take 1 tablet by mouth every 4 (four) hours as needed., Disp: 12 tablet, Rfl:  0   Lancets (FREESTYLE) lancets, Use as directed up to 3 times daily, Disp: 300 each, Rfl: 3   Lancets Misc. MISC, Use as directed 3 times daily, Disp: 300 each, Rfl: 3   levofloxacin (LEVAQUIN) 500 MG tablet, Take 1 tablet (500 mg total) by mouth daily. (Patient not taking: Reported on 11/12/2015), Disp: 2 tablet, Rfl: 0   lisinopril-hydrochlorothiazide (PRINZIDE,ZESTORETIC) 20-12.5 MG per tablet, Take 1 tablet by mouth daily., Disp: , Rfl:    lisinopril-hydrochlorothiazide (ZESTORETIC) 20-12.5 MG tablet, TAKE 1 TABLET BY MOUTH ONCE DAILY., Disp: 90 tablet, Rfl: 3   loratadine-pseudoephedrine (CLARITIN-D 24-HOUR) 10-240 MG 24 hr tablet, Take 1 tablet by mouth daily., Disp: 30 tablet, Rfl: 0   meloxicam (MOBIC)  7.5 MG tablet, Take 2 tablets (15 mg total) by mouth daily., Disp: 30 tablet, Rfl: 0   metFORMIN (GLUCOPHAGE) 500 MG tablet, Take 1,000 mg by mouth 2 (two) times daily with a meal., Disp: , Rfl:    metFORMIN (GLUCOPHAGE) 500 MG tablet, TAKE 2 TABLETS BY MOUTH TWICE A DAY, Disp: 360 tablet, Rfl: 2   metFORMIN (GLUCOPHAGE) 500 MG tablet, Take 2 tablets (1,000 mg total) by mouth 2 (two) times daily., Disp: 360 tablet, Rfl: 0   metoprolol tartrate (LOPRESSOR) 25 MG tablet, Take 0.5 tablets (12.5 mg total) by mouth 2 (two) times daily., Disp: 60 tablet, Rfl: 0   metoprolol tartrate (LOPRESSOR) 25 MG tablet, TAKE 1/2 TABLET BY MOUTH TWO TIMES DAILY WITH FOOD, Disp: 90 tablet, Rfl: 3   Multiple Vitamins-Calcium (ONE-A-DAY WOMENS PO), Take 1 tablet by mouth daily., Disp: , Rfl:    potassium chloride SA (K-DUR,KLOR-CON) 20 MEQ tablet, Take 1 tablet (20 mEq total) by mouth daily. (Patient not taking: Reported on 11/12/2015), Disp: 30 tablet, Rfl: 0   prednisoLONE acetate (PRED FORTE) 1 % ophthalmic suspension, INSTILL 1 DROP INTO RIGHT EYE DAILY FOR 7 DAYS THEN STOP., Disp: 5 mL, Rfl: 0   rosuvastatin (CRESTOR) 10 MG tablet, Take 1 tablet (10 mg total) by mouth daily., Disp: 30 tablet, Rfl: 12   Semaglutide,0.25 or 0.5MG/DOS, (OZEMPIC, 0.25 OR 0.5 MG/DOSE,) 2 MG/1.5ML SOPN, Inject 0.5 mg into the skin once a week., Disp: 1.5 mL, Rfl: 12   sitaGLIPtin (JANUVIA) 100 MG tablet, Take 100 mg by mouth daily., Disp: , Rfl:    sitaGLIPtin (JANUVIA) 100 MG tablet, TAKE 1 TABLET BY MOUTH ONCE DAILY FOR DIABETES., Disp: 30 tablet, Rfl: 11   sitaGLIPtin (JANUVIA) 100 MG tablet, Take 1 tablet (100 mg total) by mouth daily., Disp: 30 tablet, Rfl: 5  Exam: Current vital signs: BP 125/72   Pulse 81   Temp 98 F (36.7 C) (Oral)   Resp 18   SpO2 95%  Vital signs in last 24 hours: Temp:  [98 F (36.7 C)] 98 F (36.7 C) (07/10 1027) Pulse Rate:  [63-81] 81 (07/10 1200) Resp:  [16-18] 18 (07/10 1200) BP:  (124-138)/(60-75) 125/72 (07/10 1200) SpO2:  [95 %-96 %] 95 % (07/10 1200)  PE: GENERAL: Well appearing. Awake, alert in NAD.  HEENT: normocephalic and atraumatic. LUNGS - Normal respiratory effort.  CV - RRR on tele. ABDOMEN - Soft, nontender. Ext: warm, well perfused. Psych: affect   NEURO:  Mental Status: Alert and oriented x3.  Speech/Language: speech is without dysarthria or aphasia.  Naming, repetition, fluency, and comprehension intact.  Cranial Nerves:  II: PERRL  3 mm/brisk. visual fields full.  III, IV, VI: EOMI. Lid elevation symmetric and full.  V: sensation is intact and symmetrical  to face. Blinks to threat. VII: Smile is symmetrical. Able to puff cheeks and raise eyebrows.  VIII:hearing intact to voice. IX, X: palate elevation is symmetric. Phonation normal.  XI: normal sternocleidomastoid and trapezius muscle strength. EXN:TZGYFV is symmetrical without fasciculations.   Motor:  5/5 throughout.  Tone is normal. Bulk is normal.  Sensation- Intact to light touch bilaterally in all four extremities. Extinction absent to light touch to DSS.  Coordination: FTN intact bilaterally. HKS intact bilaterally. No pronator drift.  DTRs:  2+ throughout. Gait- deferred.  NIHSS:  1a Level of Consciousness: 0 1b LOC Questions: 0 1c LOC Commands: 0 2 Best Gaze: 0 3 Visual: 0 4 Facial Palsy: 0 5a Motor Arm - left: 0 5b Motor Arm - Right: 0 6a Motor Leg - Left: 0 6b Motor Leg - Right: 0 7 Limb Ataxia: 0 8 Sensory: 0 9 Best Language: 0 10 Dysarthria: 0 11 Extinction and Inattention: 0 TOTAL: 0  Labs I have reviewed labs in epic and the results pertinent to this consultation are:  CBC    Component Value Date/Time   WBC 7.9 07/19/2020 1041   RBC 5.64 (H) 07/19/2020 1041   HGB 15.3 (H) 07/19/2020 1102   HCT 45.0 07/19/2020 1102   PLT 248 07/19/2020 1041   MCV 80.3 07/19/2020 1041   MCH 23.4 (L) 07/19/2020 1041   MCHC 29.1 (L) 07/19/2020 1041   RDW 14.7  07/19/2020 1041   LYMPHSABS 1.7 07/19/2020 1041   MONOABS 0.4 07/19/2020 1041   EOSABS 0.2 07/19/2020 1041   BASOSABS 0.0 07/19/2020 1041    CMP     Component Value Date/Time   NA 141 07/19/2020 1102   K 3.5 07/19/2020 1102   CL 109 07/19/2020 1102   CO2 25 07/19/2020 1041   GLUCOSE 185 (H) 07/19/2020 1102   BUN 12 07/19/2020 1102   CREATININE 0.60 07/19/2020 1102   CALCIUM 9.5 07/19/2020 1041   PROT 7.8 07/19/2020 1041   ALBUMIN 4.2 07/19/2020 1041   AST 21 07/19/2020 1041   ALT 21 07/19/2020 1041   ALKPHOS 99 07/19/2020 1041   BILITOT 0.7 07/19/2020 1041   GFRNONAA >60 07/19/2020 1041   GFRAA >60 04/18/2019 0112    Lipid Panel     Component Value Date/Time   CHOL 94 10/27/2014 0247   TRIG 120 10/27/2014 0247   HDL 18 (L) 10/27/2014 0247   CHOLHDL 5.2 10/27/2014 0247   VLDL 24 10/27/2014 0247   LDLCALC 52 10/27/2014 0247   Imaging MD reviewed the images obtained.  CT head No acute findings.   CT head and neck No large vessel occlusion or proximal flow limiting stenosis in the head or neck. Small (1 mm) inferiorly directed outpouching arising from the left supraclinoid ICA, which may represent a small aneurysm versus an infundibulum with vessel not visible by CTA.  MRI brain Small acute infarcts in the dorsal left paramidline pons. Very faint edema without mass effect. Remote small left cerebellar, left thalamic, and bilateral basal ganglia lacunar infarcts. Moderate chronic microvascular ischemic disease.  Assessment: 65 yo female with stroke risk factors of DM II, HLD, and HTN. MRI brain with small acute infarcts, CTA head and neck without LVO. tPA was not an option because patient was outside the window. No IR as no LVO was found. Because of NIH < 5 and no intracranial stenosis, patient will need 21 days of DAPT.   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 .   Recommendations/Plan:  -  Medicine admit.  -Fasting lipid panel.   -Increase dose of statin if LDL>70. -Check HbA1c with goal < 7.  - Frequent neuro checks. - follow NIHSS. - Echocardiogram. - Aspirin 9m daily along with Plavix 732mpo qd x 21 days, then just ASA alone.  -Permissive HTN up to 220/110 x 24-48 hours, then goal is normotensive.  - Risk factor modification. - cardiac telemetry monitoring for arrhythmia. - PT consult, OT consult, Speech consult. - stroke education. - Stroke team to follow.  Pt seen by KaClance BollNP/Neuro and later by MD. Note/plan to be edited by MD as needed.  Pager: [phone] ADDENDUM Performed a face to face diagnostic evaluation.   I have reviewed the contents of history and physical exam as documented by PA/ARNP/Resident and agree with above documentation.  I have discussed and formulated the above plan as documented. Edits to the note have been made as needed.  Impression/Key exam findings/Plan: [age]/o F with stroke risk factors presenting with off balance. Found to have small pontine infarcts, consistent most likely with small vessel disease. DAPT x 21 days, followed by aspirin 8122maily alone. Stroke workup as above. Stroke team to follow.  SalDonnetta SimpersD [facility] [phone] 7pm to 7am, please call on call as listed on AMION.

## 2020-07-19 NOTE — H&P (Addendum)
History and Physical    Brittany [name] XKG:[id number] DOB: May 16, 1955 DOA: 07/19/2020  PCP: Alroy Dust, L.Marlou Sa, MD Consultants:  Dr. Katy Fitch: opthalmology  Patient coming from:  Home - lives with husband  Chief Complaint: feeling off balance   HPI: Brittany [name] is a 65 y.o. female with medical history significant of DM2, HTN, HLD,  chronic diastolic CHF who presented to ER for feeling off balance. LKW at 9:30pm 07/18/2020. When she woke up this morning around 7:40am while trying to go to the bathroom she was off balance and walking all over the place. She didn't fall. She denies any headache, vision changes, chest pain or palpitations. Her husband states she had no slurred speech, droopy face or unilateral weakness. She continued to stagger around while walking and so they came to ER.   No hx of any prior strokes. No family hx of CVAs.   ED Course: vitals: afebrile, bp: 138/75, HR: 63, RR: 18 and 96% on room air. Labs with no pertinent findings. CTH negative; however MRI brain shows small acute infarct. Asked to admit.   Review of Systems: As per HPI; otherwise review of systems reviewed and negative.   Ambulatory Status:  Ambulates without assistance  COVID Vaccine Status: J&J x1   Past Medical History:  Diagnosis Date   Diabetes mellitus without complication ([place])    Hyperlipidemia    Hypertension     History reviewed. No pertinent surgical history.  Social History   Socioeconomic History   Marital status: Married    Spouse name: Not on file   Number of children: Not on file   Years of education: Not on file   Highest education level: Not on file  Occupational History   Not on file  Tobacco Use   Smoking status: Former    Pack years: 0.00   Smokeless tobacco: Never  Substance and Sexual Activity   Alcohol use: No    Alcohol/week: 0.0 standard drinks   Drug use: No   Sexual activity: Yes  Other Topics Concern   Not on file  Social History Narrative   Not on file    Social Determinants of Health   Financial Resource Strain: Not on file  Food Insecurity: Not on file  Transportation Needs: Not on file  Physical Activity: Not on file  Stress: Not on file  Social Connections: Not on file  Intimate Partner Violence: Not on file    Allergies  Allergen Reactions   Pioglitazone Other (See Comments)    Headache and myalgia Other reaction(s): Other (See Comments) Chest pain and dizziness   Canagliflozin Other (See Comments) and Palpitations   Statins Nausea Only    Family History  Problem Relation Age of Onset   Heart block Mother    Asthma Other    Hypertension Other    Hyperlipidemia Other    Diabetes Other     Prior to Admission medications   Medication Sig Start Date End Date Taking? Authorizing Provider  aspirin 81 MG tablet Take 81 mg by mouth daily.    [provider]  Blood Glucose Monitoring Suppl (ACCU-CHEK GUIDE) w/Device KIT use for blood sugar check fingerstick three times a day DX E11.69 06/18/20     Blood Glucose Monitoring Suppl (FREESTYLE LITE) w/Device KIT Use as directed three times daily 06/30/20     Dulaglutide 0.75 MG/0.5ML SOPN INJECT 1 PEN INTO THE SKIN ONCE A WEEK. 03/06/20 03/06/21  Alroy Dust, L.Marlou Sa, MD  empagliflozin (JARDIANCE) 25 MG TABS tablet Take  1 tablet (25 mg total) by mouth daily. 07/01/20     Exenatide ER (BYDUREON) 2 MG SUSR Inject 2 mg into the skin once a week. Saturday    [provider]  glucose blood (ACCU-CHEK GUIDE) test strip Use 1 strip finger stick up to three times a day to check blood sugar DXE11.69 06/18/20     glucose blood (FREESTYLE LITE) test strip Use as directed up to 3 times daily 06/30/20     HYDROcodone-acetaminophen (NORCO/VICODIN) 5-325 MG tablet Take 1 tablet by mouth every 4 (four) hours as needed. 04/18/19   Larene Pickett, PA-C  Lancets (FREESTYLE) lancets Use as directed up to 3 times daily 06/30/20     Lancets Misc. MISC Use as directed 3 times daily 06/18/20      levofloxacin (LEVAQUIN) 500 MG tablet Take 1 tablet (500 mg total) by mouth daily. Patient not taking: Reported on 11/12/2015 10/27/14   Donne Hazel, MD  lisinopril-hydrochlorothiazide (PRINZIDE,ZESTORETIC) 20-12.5 MG per tablet Take 1 tablet by mouth daily.    [provider]  lisinopril-hydrochlorothiazide (ZESTORETIC) 20-12.5 MG tablet TAKE 1 TABLET BY MOUTH ONCE DAILY. 05/29/19 08/12/20  Alroy Dust, L.Marlou Sa, MD  loratadine-pseudoephedrine (CLARITIN-D 24-HOUR) 10-240 MG 24 hr tablet Take 1 tablet by mouth daily. 10/27/14   Donne Hazel, MD  meloxicam (MOBIC) 7.5 MG tablet Take 2 tablets (15 mg total) by mouth daily. 04/18/19   Larene Pickett, PA-C  metFORMIN (GLUCOPHAGE) 500 MG tablet Take 1,000 mg by mouth 2 (two) times daily with a meal.    [provider]  metFORMIN (GLUCOPHAGE) 500 MG tablet TAKE 2 TABLETS BY MOUTH TWICE A DAY 04/08/19 04/07/20  Mitchell, L.Marlou Sa, MD  metFORMIN (GLUCOPHAGE) 500 MG tablet Take 2 tablets (1,000 mg total) by mouth 2 (two) times daily. 06/03/20     metoprolol tartrate (LOPRESSOR) 25 MG tablet Take 0.5 tablets (12.5 mg total) by mouth 2 (two) times daily. 10/24/14   Cherene Altes, MD  metoprolol tartrate (LOPRESSOR) 25 MG tablet TAKE 1/2 TABLET BY MOUTH TWO TIMES DAILY WITH FOOD 05/31/19 07/14/20  Alroy Dust, L.Marlou Sa, MD  Multiple Vitamins-Calcium (ONE-A-DAY WOMENS PO) Take 1 tablet by mouth daily.    [provider]  potassium chloride SA (K-DUR,KLOR-CON) 20 MEQ tablet Take 1 tablet (20 mEq total) by mouth daily. Patient not taking: Reported on 11/12/2015 10/24/14   Cherene Altes, MD  prednisoLONE acetate (PRED FORTE) 1 % ophthalmic suspension INSTILL 1 DROP INTO RIGHT EYE DAILY FOR 7 DAYS THEN STOP. 10/21/19 10/20/20  Debbra Riding, MD  rosuvastatin (CRESTOR) 10 MG tablet Take 1 tablet (10 mg total) by mouth daily. 06/30/20     Semaglutide,0.25 or 0.5MG/DOS, (OZEMPIC, 0.25 OR 0.5 MG/DOSE,) 2 MG/1.5ML SOPN Inject 0.5 mg into the  skin once a week. 05/21/20     sitaGLIPtin (JANUVIA) 100 MG tablet Take 100 mg by mouth daily.    [provider]  sitaGLIPtin (JANUVIA) 100 MG tablet TAKE 1 TABLET BY MOUTH ONCE DAILY FOR DIABETES. 07/29/19 07/28/20  Alroy Dust, L.Marlou Sa, MD  sitaGLIPtin (JANUVIA) 100 MG tablet Take 1 tablet (100 mg total) by mouth daily. 07/01/20       Physical Exam: Vitals:   07/19/20 1027 07/19/20 1130 07/19/20 1200 07/19/20 1430  BP: 138/75 124/60 125/72 (!) 151/82  Pulse: 63 80 81 88  Resp: _0 Temp: 98 F (36.7 C)     TempSrc: Oral     SpO2: 96% 95% 95% 97%  General:  Appears calm and comfortable and is in NAD Eyes:  PERRL, EOMI, normal lids, iris ENT:  grossly normal hearing, lips & tongue, mmm; appropriate dentition Neck:  no LAD, masses or thyromegaly; no carotid bruits Cardiovascular:  RRR, no m/r/g. No LE edema.  Respiratory:   CTA bilaterally with no wheezes/rales/rhonchi.  Normal respiratory effort. Abdomen:  soft, NT, ND, NABS Back:   normal alignment, no CVAT Skin:  no rash or induration seen on limited exam Musculoskeletal:  grossly normal tone BUE/BLE, good ROM, no bony abnormality Lower extremity:  No LE edema.  Limited foot exam with no ulcerations.  2+ distal pulses. Psychiatric:  grossly normal mood and affect, speech fluent and appropriate, AOx3 Neurologic:  CN 2-12 grossly intact, moves all extremities in coordinated fashion, sensation intact. Strength 5/5 in bilateral UE and LE. DTR wnl. Sensation intact. Heel knee to ankle wnl bilaterally. Diadochokinesis intact. Laryngeal elevation wnl.  Gait: did not evaluate, did not feel comfortable walking for me.    Radiological Exams on Admission: Independently reviewed - see discussion in A/P where applicable  CT ANGIO HEAD NECK W WO CM  Result Date: 07/19/2020 CLINICAL DATA:  Vertigo. EXAM: CT ANGIOGRAPHY HEAD AND NECK TECHNIQUE: Multidetector CT imaging of the head and neck was performed using the standard  protocol during bolus administration of intravenous contrast. Multiplanar CT image reconstructions and MIPs were obtained to evaluate the vascular anatomy. Carotid stenosis measurements (when applicable) are obtained utilizing NASCET criteria, using the distal internal carotid diameter as the denominator. CONTRAST:  135m OMNIPAQUE IOHEXOL 350 MG/ML SOLN COMPARISON:  None. FINDINGS: CTA NECK FINDINGS Aortic arch: Calcific atherosclerosis. Great vessel origins are patent. Right carotid system: Motion limited evaluation proximally without visible significant (greater than 50%) stenosis. Mild atherosclerosis at the carotid bifurcation. Left carotid system: Mild atherosclerosis at the carotid bifurcation without significant (greater than 50%) stenosis. Vertebral arteries: Mildly right dominant. Bilateral vertebral arteries are patent without evidence of greater than 50% stenosis. Skeleton: Moderate degenerative disc disease on the left at C5-C6 with disc height loss and endplate spurring. Other neck: No acute findings. Upper chest: Visualized lung apices are clear. Review of the MIP images confirms the above findings CTA HEAD FINDINGS Anterior circulation: Bilateral intracranial ICAs, MCAs, and ACAs are patent without proximal flow limiting stenosis. Small (1 mm) inferiorly directed outpouching arising from the left supraclinoid ICA (see series 9, image 90) Posterior circulation: Mild atherosclerosis of bilateral intradural vertebral arteries. Bilateral intradural vertebral arteries, basilar artery, and posterior cerebral arteries are patent without proximal flow limiting stenosis. Small right P1 PCA with posterior communicating artery, anatomic variant. No aneurysm identified. Venous sinuses: As permitted by contrast timing, patent. Anatomic variants: Detailed above. Review of the MIP images confirms the above findings IMPRESSION: 1. No large vessel occlusion or proximal flow limiting stenosis in the head or neck. 2.  Small (1 mm) inferiorly directed outpouching arising from the left supraclinoid ICA, which may represent a small aneurysm versus an infundibulum with vessel not visible by CTA. Electronically Signed   By: FMargaretha SheffieldMD   On: 07/19/2020 13:43   CT HEAD WO CONTRAST  Result Date: 07/19/2020 CLINICAL DATA:  Dizziness and off balance.  Right arm tingling. EXAM: CT HEAD WITHOUT CONTRAST TECHNIQUE: Contiguous axial images were obtained from the base of the skull through the vertex without intravenous contrast. COMPARISON:  None. FINDINGS: Brain: Ventricles, cisterns and other CSF spaces are normal. No mass, mass effect, shift of midline structures or acute hemorrhage. No evidence  of acute infarction. Vascular: No hyperdense vessel or unexpected calcification. Skull: Normal. Negative for fracture or focal lesion. Sinuses/Orbits: No acute finding. Other: None. IMPRESSION: No acute findings. Electronically Signed   By: Marin Olp M.D.   On: 07/19/2020 11:24   MR BRAIN WO CONTRAST  Result Date: 07/19/2020 CLINICAL DATA:  Small left mastoid effusion. EXAM: MRI HEAD WITHOUT CONTRAST TECHNIQUE: Multiplanar, multiecho pulse sequences of the brain and surrounding structures were obtained without intravenous contrast. COMPARISON:  CT head from the same day. FINDINGS: Brain: Small acute infarcts in the dorsal left paramidline pons (series 5, images 62 through 64). Very faint edema without mass effect. Remote small left cerebellar, left thalamic, and bilateral basal ganglia lacunar infarcts. Additional moderate scattered T2/FLAIR hyperintensities within the white matter, which are nonspecific but potentially related to chronic microvascular ischemic disease. No evidence of hydrocephalus, acute hemorrhage, extra-axial fluid collection, mass lesion, or midline shift. Vascular: Further evaluated on same day CTA. Skull and upper cervical spine: Normal marrow signal. Sinuses/Orbits: Clear sinuses.  Unremarkable orbits.  Other: Small left mastoid effusion. IMPRESSION: 1. Small acute infarcts in the dorsal left paramidline pons. Very faint edema without mass effect. 2. Remote small left cerebellar, left thalamic, and bilateral basal ganglia lacunar infarcts. 3. Moderate chronic microvascular ischemic disease. Electronically Signed   By: Margaretha Sheffield MD   On: 07/19/2020 13:27    EKG: Independently reviewed.  NSR with rate 70 with non specific intra ventricular conduction block-new since last tracing.  nonspecific ST changes with no evidence of acute ischemia   Labs on Admission: I have personally reviewed the available labs and imaging studies at the time of the admission.  Pertinent labs:  None   Assessment/Plan Principal Problem:   Acute CVA (cerebrovascular accident) [facility]) -neurology consulted by EDP -MRI with acute infarcts in the dorsal left paramidline pons. Very faint edema without mass effect. Remote infarcts.  -frequent neuro checks per unit protocol  -telemetry monitoring  -echo pending, lipid panel and a1c pending -PT/OT to eval and treat. Do not think she needs speech unless fails bedside swallow; however, her laryngeal elevation is wnl.  -NPO until bedside swallow  -- SBP goal - permissive hypertension first 24 h < 220/110. Will hold home meds -continue ASA and plavix 52m daily x 21 then ASA alone. days per neurology.  -DVT prophylaxis with lovenox    -Small (1 mm) inferiorly directed outpouching arising from the left supraclinoid ICA, which may represent a small aneurysm versus an infundibulum with vessel not visible by CTA-f/u outpatient/per neuro recommendations   Active Problems:   Diabetes mellitus type 2, uncontrolled, with complications (HCC) -[id number] -will continue her trulicity and jTongaand jardiance. Hold metformin. -discussed importance of good glycemic control  -SSI/accuchecks/carb modified diet     Chronic diastolic congestive heart failure (HCC) No signs of  exacerbation.  Last echo: 10/2014 with preserved EF of 50-55% and grade 1 diastolic dysfunction -repeat pending  -strict I/O and daily weights     Hyperlipidemia -lipid panel pending. Goal LDL <70 -continue crestor     HTN (hypertension) -permissive HTN in first 24 hours. Hold home medication and resume tomorrow.       There is no height or weight on file to calculate BMI.     Level of care: Telemetry Medical DVT prophylaxis:  Lovenox  Code Status:  Full - confirmed with patient Family Communication: husband: NPrachi Oftedahl Disposition Plan:  The patient is from: home  Anticipated d/c is to: home  Anticipated d/c date will depend on clinical response to treatment and work up for acute stroke,  but possibly as early as tomorrow .  Consults called: neurology in ED  Admission status:  observation    Orma Flaming MD [facility]   How to contact the [facility] Attending or Consulting provider [place] or covering provider during after hours [place], for this patient?  Check the care team in [facility] and look for a) attending/consulting [facility] provider listed and b) the [facility] team listed Log into [email or web address] and use [place]'s universal password to access. If you do not have the password, please contact the hospital operator. Locate the [facility] provider you are looking for under [facility] and page to a number that you can be directly reached. If you still have difficulty reaching the provider, please page the [facility] (Director on Call) for the Hospitalists listed on amion for assistance.   07/19/2020, 3:14 PM

## 2020-07-19 NOTE — ED Provider Notes (Signed)
[facility] Provider Note   CSN: [id number] Arrival date & time: 07/19/20  1021     History No chief complaint on file.   Brittany [name] is a 65 y.o. female.  HPI     65yo female with history of DM, htn, hlpd presents with concern for feeling off balance. Felt herself when she went to bed last night. Woke up at 6AM and felt dizzy/off balance.  Has had some blurred vision sicne a week ago.  Right hand/forearm tingling started this AM. Denies numbness, weakness, difficulty talking, visual changes or facial droop.   Difficult walking due to being off balance.    Past Medical History:  Diagnosis Date   Diabetes mellitus without complication ([place])    Hyperlipidemia    Hypertension     Patient Active Problem List   Diagnosis Date Noted   HTN (hypertension) 07/19/2020   Acute CVA (cerebrovascular accident) ([place]) 07/19/2020   Chronic diastolic congestive heart failure ([place]) 11/28/2019   Pyrexia    SIRS (systemic inflammatory response syndrome) ([place]) 10/26/2014   Tachypnea 10/26/2014   Hyperlipidemia 10/26/2014   Elevated transaminase level 10/26/2014   Sepsis ([place]) 10/25/2014   Leukocytosis    Diabetes mellitus type 2, uncontrolled, with complications ([place]) 92/01/69   Hypokalemia 10/20/2014   Shortness of breath    Elevated troponin    Diabetes ([place]) 09/13/2012    History reviewed. No pertinent surgical history.   OB History   No obstetric history on file.     Family History  Problem Relation Age of Onset   Heart block Mother    Asthma Other    Hypertension Other    Hyperlipidemia Other    Diabetes Other     Social History   Tobacco Use   Smoking status: Former    Pack years: 0.00   Smokeless tobacco: Never  Substance Use Topics   Alcohol use: No    Alcohol/week: 0.0 standard drinks   Drug use: No    Home Medications Prior to Admission medications   Medication Sig Start Date End Date Taking? Authorizing  Provider  acetaminophen (TYLENOL) 500 MG tablet Take 500 mg by mouth every 6 (six) hours as needed for mild pain.   Yes [provider]  aspirin 81 MG tablet Take 81 mg by mouth daily.   Yes [provider]  dextromethorphan-guaiFENesin (MUCINEX DM) 30-600 MG 12hr tablet Take 1 tablet by mouth 2 (two) times daily as needed for cough.   Yes [provider]  Dulaglutide 0.75 MG/0.5ML SOPN INJECT 1 PEN INTO THE SKIN ONCE A WEEK. Patient taking differently: Inject 0.75 mg into the skin every Sunday. 03/06/20 03/06/21 Yes Mitchell, L.Marlou Sa, MD  empagliflozin (JARDIANCE) 25 MG TABS tablet Take 1 tablet (25 mg total) by mouth daily. 07/01/20  Yes   lisinopril-hydrochlorothiazide (ZESTORETIC) 20-12.5 MG tablet TAKE 1 TABLET BY MOUTH ONCE DAILY. 05/29/19 08/12/20 Yes Mitchell, L.Marlou Sa, MD  metFORMIN (GLUCOPHAGE) 500 MG tablet Take 2 tablets (1,000 mg total) by mouth 2 (two) times daily. 06/03/20  Yes   metoprolol tartrate (LOPRESSOR) 25 MG tablet TAKE 1/2 TABLET BY MOUTH TWO TIMES DAILY WITH FOOD Patient taking differently: Take 12.5 mg by mouth 2 (two) times daily with a meal. 05/31/19 07/19/20 Yes Mitchell, L.Marlou Sa, MD  Multiple Vitamins-Calcium (ONE-A-DAY WOMENS PO) Take 1 tablet by mouth daily.   Yes [provider]  rosuvastatin (CRESTOR) 10 MG tablet Take 1 tablet (10 mg total) by mouth daily. 06/30/20  Yes  sitaGLIPtin (JANUVIA) 100 MG tablet Take 1 tablet (100 mg total) by mouth daily. 07/01/20  Yes   triamcinolone cream (KENALOG) 0.1 % Apply 1 application topically 2 (two) times daily as needed (for itching). 04/14/20  Yes [provider]  Blood Glucose Monitoring Suppl (ACCU-CHEK GUIDE) w/Device KIT use for blood sugar check fingerstick three times a day DX E11.69 06/18/20     Blood Glucose Monitoring Suppl (FREESTYLE LITE) w/Device KIT Use as directed three times daily 06/30/20     glucose blood (ACCU-CHEK GUIDE) test strip Use 1 strip finger stick up to three times a  day to check blood sugar DXE11.69 06/18/20     glucose blood (FREESTYLE LITE) test strip Use as directed up to 3 times daily 06/30/20     HYDROcodone-acetaminophen (NORCO/VICODIN) 5-325 MG tablet Take 1 tablet by mouth every 4 (four) hours as needed. Patient not taking: No sig reported 04/18/19   Larene Pickett, PA-C  Lancets (FREESTYLE) lancets Use as directed up to 3 times daily 06/30/20     Lancets Misc. MISC Use as directed 3 times daily 06/18/20     Semaglutide,0.25 or 0.5MG/DOS, (OZEMPIC, 0.25 OR 0.5 MG/DOSE,) 2 MG/1.5ML SOPN Inject 0.5 mg into the skin once a week. Patient not taking: No sig reported 05/21/20     sitaGLIPtin (JANUVIA) 100 MG tablet TAKE 1 TABLET BY MOUTH ONCE DAILY FOR DIABETES. Patient not taking: Reported on 07/19/2020 07/29/19 07/28/20  Alroy Dust, L.Marlou Sa, MD    Allergies    Pioglitazone, Canagliflozin, and Statins  Review of Systems   Review of Systems  Constitutional:  Negative for fever.  HENT:  Negative for sore throat.   Eyes:  Negative for visual disturbance.  Respiratory:  Negative for cough and shortness of breath.   Cardiovascular:  Negative for chest pain.  Gastrointestinal:  Negative for abdominal pain, nausea and vomiting.  Genitourinary:  Negative for difficulty urinating.  Musculoskeletal:  Positive for gait problem. Negative for back pain and neck pain.  Skin:  Negative for rash.  Neurological:  Positive for dizziness. Negative for syncope, facial asymmetry, speech difficulty, weakness, numbness (tingling right hand) and headaches.   Physical Exam Updated Vital Signs BP (!) 102/47   Pulse 87   Temp 98 F (36.7 C) (Oral)   Resp (!) 22   SpO2 92%   Physical Exam Vitals and nursing note reviewed.  Constitutional:      General: She is not in acute distress.    Appearance: She is well-developed. She is not diaphoretic.  HENT:     Head: Normocephalic and atraumatic.     Comments: Slight past pointing right arm on finger to nose Left eye with  difficulty abducting, rotary nystagmus Normal strength upper and lower extremities, reports sensation normal although tingling present RLE Eyes:     Conjunctiva/sclera: Conjunctivae normal.  Cardiovascular:     Rate and Rhythm: Normal rate and regular rhythm.     Heart sounds: Normal heart sounds. No murmur heard.   No friction rub. No gallop.  Pulmonary:     Effort: Pulmonary effort is normal. No respiratory distress.     Breath sounds: Normal breath sounds. No wheezing or rales.  Abdominal:     General: There is no distension.     Palpations: Abdomen is soft.     Tenderness: There is no abdominal tenderness. There is no guarding.  Musculoskeletal:        General: No tenderness.     Cervical back: Normal range of motion.  Skin:    General: Skin is warm and dry.     Findings: No erythema or rash.  Neurological:     Mental Status: She is alert and oriented to person, place, and time.    ED Results / Procedures / Treatments   Labs (all labs ordered are listed, but only abnormal results are displayed) Labs Reviewed  APTT - Abnormal; Notable for the following components:      Result Value   aPTT 21 (*)    All other components within normal limits  CBC - Abnormal; Notable for the following components:   RBC 5.64 (*)    MCH 23.4 (*)    MCHC 29.1 (*)    All other components within normal limits  COMPREHENSIVE METABOLIC PANEL - Abnormal; Notable for the following components:   Glucose, Bld 188 (*)    All other components within normal limits  I-STAT CHEM 8, ED - Abnormal; Notable for the following components:   Glucose, Bld 185 (*)    Calcium, Ion 1.01 (*)    Hemoglobin 15.3 (*)    All other components within normal limits  CBG MONITORING, ED - Abnormal; Notable for the following components:   Glucose-Capillary 183 (*)    All other components within normal limits  PROTIME-INR  DIFFERENTIAL  HIV ANTIBODY (ROUTINE TESTING W REFLEX)  HEMOGLOBIN A1C  LIPID PANEL  TSH     EKG EKG Interpretation  Date/Time:  Sunday July 19 2020 10:32:18 EDT Ventricular Rate:  70 PR Interval:  142 QRS Duration: 130 QT Interval:  440 QTC Calculation: 475 R Axis:   -5 Text Interpretation: Normal sinus rhythm Non-specific intra-ventricular conduction block Cannot rule out Anterior infarct , age undetermined Abnormal ECG Since prior ECG, QRS intra-ventricular conduction block is new Confirmed by Gareth Morgan [phone]) on 07/19/2020 11:18:32 AM  Radiology CT ANGIO HEAD NECK W WO CM  Result Date: 07/19/2020 CLINICAL DATA:  Vertigo. EXAM: CT ANGIOGRAPHY HEAD AND NECK TECHNIQUE: Multidetector CT imaging of the head and neck was performed using the standard protocol during bolus administration of intravenous contrast. Multiplanar CT image reconstructions and MIPs were obtained to evaluate the vascular anatomy. Carotid stenosis measurements (when applicable) are obtained utilizing NASCET criteria, using the distal internal carotid diameter as the denominator. CONTRAST:  145m OMNIPAQUE IOHEXOL 350 MG/ML SOLN COMPARISON:  None. FINDINGS: CTA NECK FINDINGS Aortic arch: Calcific atherosclerosis. Great vessel origins are patent. Right carotid system: Motion limited evaluation proximally without visible significant (greater than 50%) stenosis. Mild atherosclerosis at the carotid bifurcation. Left carotid system: Mild atherosclerosis at the carotid bifurcation without significant (greater than 50%) stenosis. Vertebral arteries: Mildly right dominant. Bilateral vertebral arteries are patent without evidence of greater than 50% stenosis. Skeleton: Moderate degenerative disc disease on the left at C5-C6 with disc height loss and endplate spurring. Other neck: No acute findings. Upper chest: Visualized lung apices are clear. Review of the MIP images confirms the above findings CTA HEAD FINDINGS Anterior circulation: Bilateral intracranial ICAs, MCAs, and ACAs are patent without proximal flow  limiting stenosis. Small (1 mm) inferiorly directed outpouching arising from the left supraclinoid ICA (see series 9, image 90) Posterior circulation: Mild atherosclerosis of bilateral intradural vertebral arteries. Bilateral intradural vertebral arteries, basilar artery, and posterior cerebral arteries are patent without proximal flow limiting stenosis. Small right P1 PCA with posterior communicating artery, anatomic variant. No aneurysm identified. Venous sinuses: As permitted by contrast timing, patent. Anatomic variants: Detailed above. Review of the MIP images confirms the above findings IMPRESSION:  1. No large vessel occlusion or proximal flow limiting stenosis in the head or neck. 2. Small (1 mm) inferiorly directed outpouching arising from the left supraclinoid ICA, which may represent a small aneurysm versus an infundibulum with vessel not visible by CTA. Electronically Signed   By: Margaretha Sheffield MD   On: 07/19/2020 13:43   CT HEAD WO CONTRAST  Result Date: 07/19/2020 CLINICAL DATA:  Dizziness and off balance.  Right arm tingling. EXAM: CT HEAD WITHOUT CONTRAST TECHNIQUE: Contiguous axial images were obtained from the base of the skull through the vertex without intravenous contrast. COMPARISON:  None. FINDINGS: Brain: Ventricles, cisterns and other CSF spaces are normal. No mass, mass effect, shift of midline structures or acute hemorrhage. No evidence of acute infarction. Vascular: No hyperdense vessel or unexpected calcification. Skull: Normal. Negative for fracture or focal lesion. Sinuses/Orbits: No acute finding. Other: None. IMPRESSION: No acute findings. Electronically Signed   By: Marin Olp M.D.   On: 07/19/2020 11:24   MR BRAIN WO CONTRAST  Result Date: 07/19/2020 CLINICAL DATA:  Small left mastoid effusion. EXAM: MRI HEAD WITHOUT CONTRAST TECHNIQUE: Multiplanar, multiecho pulse sequences of the brain and surrounding structures were obtained without intravenous contrast.  COMPARISON:  CT head from the same day. FINDINGS: Brain: Small acute infarcts in the dorsal left paramidline pons (series 5, images 62 through 64). Very faint edema without mass effect. Remote small left cerebellar, left thalamic, and bilateral basal ganglia lacunar infarcts. Additional moderate scattered T2/FLAIR hyperintensities within the white matter, which are nonspecific but potentially related to chronic microvascular ischemic disease. No evidence of hydrocephalus, acute hemorrhage, extra-axial fluid collection, mass lesion, or midline shift. Vascular: Further evaluated on same day CTA. Skull and upper cervical spine: Normal marrow signal. Sinuses/Orbits: Clear sinuses.  Unremarkable orbits. Other: Small left mastoid effusion. IMPRESSION: 1. Small acute infarcts in the dorsal left paramidline pons. Very faint edema without mass effect. 2. Remote small left cerebellar, left thalamic, and bilateral basal ganglia lacunar infarcts. 3. Moderate chronic microvascular ischemic disease. Electronically Signed   By: Margaretha Sheffield MD   On: 07/19/2020 13:27   ECHOCARDIOGRAM COMPLETE  Result Date: 07/19/2020    ECHOCARDIOGRAM REPORT   Patient Name:   Brittany [name] Date of Exam: 07/19/2020 Medical Rec #:  [id number]      Height:       63.0 in Accession #:    [id number]     Weight:       203.4 lb Date of Birth:  [birth year]      BSA:          1.948 m Patient Age:    59 years       BP:           197/110 mmHg Patient Gender: F              HR:           83 bpm. Exam Location:  Inpatient Procedure: 2D Echo, Cardiac Doppler and Color Doppler Indications:    Stroke I63.9  History:        Patient has prior history of Echocardiogram examinations, most                 recent 10/21/2014. Risk Factors:Hypertension and Obesity.  Sonographer:    Merrie Roof RDCS Referring Phys: [id number] [place]  1. Left ventricular ejection fraction, by estimation, is 55 to 60%. The left ventricle has normal function. The left  ventricle has no regional wall motion  abnormalities. Left ventricular diastolic parameters are consistent with Grade I diastolic dysfunction (impaired relaxation).  2. Right ventricular systolic function is normal. The right ventricular size is normal.  3. The mitral valve is normal in structure. No evidence of mitral valve regurgitation. No evidence of mitral stenosis.  4. The aortic valve is tricuspid. Aortic valve regurgitation is not visualized. Mild aortic valve sclerosis is present, with no evidence of aortic valve stenosis.  5. The inferior vena cava is normal in size with greater than 50% respiratory variability, suggesting right atrial pressure of 3 mmHg. FINDINGS  Left Ventricle: Left ventricular ejection fraction, by estimation, is 55 to 60%. The left ventricle has normal function. The left ventricle has no regional wall motion abnormalities. The left ventricular internal cavity size was normal in size. There is  no left ventricular hypertrophy. Left ventricular diastolic parameters are consistent with Grade I diastolic dysfunction (impaired relaxation). Normal left ventricular filling pressure. Right Ventricle: The right ventricular size is normal. No increase in right ventricular wall thickness. Right ventricular systolic function is normal. Left Atrium: Left atrial size was normal in size. Right Atrium: Right atrial size was normal in size. Pericardium: There is no evidence of pericardial effusion. Mitral Valve: The mitral valve is normal in structure. Mild to moderate mitral annular calcification. No evidence of mitral valve regurgitation. No evidence of mitral valve stenosis. Tricuspid Valve: The tricuspid valve is normal in structure. Tricuspid valve regurgitation is not demonstrated. No evidence of tricuspid stenosis. Aortic Valve: The aortic valve is tricuspid. Aortic valve regurgitation is not visualized. Mild aortic valve sclerosis is present, with no evidence of aortic valve stenosis. Aortic  valve mean gradient measures 3.0 mmHg. Aortic valve peak gradient measures 6.2 mmHg. Aortic valve area, by VTI measures 2.33 cm. Pulmonic Valve: The pulmonic valve was normal in structure. Pulmonic valve regurgitation is not visualized. No evidence of pulmonic stenosis. Aorta: The aortic root is normal in size and structure. Venous: The inferior vena cava is normal in size with greater than 50% respiratory variability, suggesting right atrial pressure of 3 mmHg. IAS/Shunts: No atrial level shunt detected by color flow Doppler.  LEFT VENTRICLE PLAX 2D LVIDd:         3.40 cm  Diastology LVIDs:         2.50 cm  LV e' medial:    6.53 cm/s LV PW:         1.20 cm  LV E/e' medial:  9.6 LV IVS:        1.10 cm  LV e' lateral:   8.92 cm/s LVOT diam:     1.90 cm  LV E/e' lateral: 7.0 LV SV:         49 LV SV Index:   25 LVOT Area:     2.84 cm  RIGHT VENTRICLE RV Basal diam:  2.70 cm LEFT ATRIUM             Index       RIGHT ATRIUM           Index LA diam:        3.40 cm 1.75 cm/m  RA Area:     10.50 cm LA Vol (A2C):   61.1 ml 31.37 ml/m RA Volume:   19.50 ml  10.01 ml/m LA Vol (A4C):   35.3 ml 18.13 ml/m LA Biplane Vol: 48.9 ml 25.11 ml/m  AORTIC VALVE AV Area (Vmax):    2.31 cm AV Area (Vmean):   2.17 cm AV Area (VTI):  2.33 cm AV Vmax:           124.00 cm/s AV Vmean:          82.800 cm/s AV VTI:            0.212 m AV Peak Grad:      6.2 mmHg AV Mean Grad:      3.0 mmHg LVOT Vmax:         101.00 cm/s LVOT Vmean:        63.400 cm/s LVOT VTI:          0.174 m LVOT/AV VTI ratio: 0.82  AORTA Ao Root diam: 2.70 cm MITRAL VALVE MV Area (PHT): 4.80 cm    SHUNTS MV Decel Time: 158 msec    Systemic VTI:  0.17 m MV E velocity: 62.40 cm/s  Systemic Diam: 1.90 cm MV A velocity: 93.30 cm/s MV E/A ratio:  0.67 Mihai Croitoru MD Electronically signed by Sanda Klein MD Signature Date/Time: 07/19/2020/4:04:13 PM    Final     Procedures Procedures   Medications Ordered in ED Medications  sodium chloride flush (NS) 0.9 %  injection 3 mL (3 mLs Intravenous Not Given 07/19/20 1116)   stroke: mapping our early stages of recovery book (has no administration in time range)  acetaminophen (TYLENOL) tablet 650 mg (has no administration in time range)    Or  acetaminophen (TYLENOL) 160 MG/5ML solution 650 mg (has no administration in time range)    Or  acetaminophen (TYLENOL) suppository 650 mg (has no administration in time range)  enoxaparin (LOVENOX) injection 40 mg (40 mg Subcutaneous Given 07/19/20 2115)  senna-docusate (Senokot-S) tablet 1 tablet (has no administration in time range)  clopidogrel (PLAVIX) tablet 75 mg (75 mg Oral Given 07/19/20 2116)  iohexol (OMNIPAQUE) 350 MG/ML injection 100 mL (100 mLs Intravenous Contrast Given 07/19/20 1312)    ED Course  I have reviewed the triage vital signs and the nursing notes.  Pertinent labs & imaging results that were available during my care of the patient were reviewed by me and considered in my medical decision making (see chart for details).    MDM Rules/Calculators/A&P                          65yo female with history of DM, htn, hlpd presents with concern for feeling off balance.  Pt out of tPA window on arrival to ED, not appropriate for intervention.  Discussed history and exam with Dr. Curly Shores on patient arrival. Ordered MRI, brain, CTA.  MR consistent with CVA.  Will admit for continued care.   Final Clinical Impression(s) /  ED Diagnoses Final diagnoses:  Cerebrovascular accident (CVA), unspecified mechanism ([place])    Rx / DC Orders ED Discharge Orders     None        Gareth Morgan, MD 07/19/20 2347

## 2020-07-19 NOTE — ED Notes (Signed)
Patient transported to MRI 

## 2020-07-19 NOTE — Progress Notes (Signed)
  Echocardiogram 2D Echocardiogram has been performed.  Brittany [name] F 07/19/2020, 3:34 PM

## 2020-07-19 NOTE — ED Triage Notes (Signed)
Pt states she feels "off balance".  Denies dizziness.  No arm or leg drift.  Speech clear.  No facial droop.  Went to bed at 9:30pm last night and when she got up at 6am she felt off balance.  Also reports tingling to R arm that she just noticed.

## 2020-07-20 ENCOUNTER — Other Ambulatory Visit: Payer: Self-pay | Admitting: Neurology

## 2020-07-20 ENCOUNTER — Other Ambulatory Visit (HOSPITAL_COMMUNITY): Payer: Self-pay

## 2020-07-20 DIAGNOSIS — E1165 Type 2 diabetes mellitus with hyperglycemia: Secondary | ICD-10-CM

## 2020-07-20 DIAGNOSIS — R7989 Other specified abnormal findings of blood chemistry: Secondary | ICD-10-CM

## 2020-07-20 DIAGNOSIS — I639 Cerebral infarction, unspecified: Secondary | ICD-10-CM | POA: Diagnosis not present

## 2020-07-20 DIAGNOSIS — I10 Essential (primary) hypertension: Secondary | ICD-10-CM

## 2020-07-20 DIAGNOSIS — E785 Hyperlipidemia, unspecified: Secondary | ICD-10-CM

## 2020-07-20 DIAGNOSIS — E118 Type 2 diabetes mellitus with unspecified complications: Secondary | ICD-10-CM | POA: Diagnosis not present

## 2020-07-20 DIAGNOSIS — I5032 Chronic diastolic (congestive) heart failure: Secondary | ICD-10-CM

## 2020-07-20 LAB — GLUCOSE, CAPILLARY
Glucose-Capillary: 126 mg/dL — ABNORMAL HIGH (ref 70–99)
Glucose-Capillary: 91 mg/dL (ref 70–99)

## 2020-07-20 LAB — TSH: TSH: 0.347 u[IU]/mL — ABNORMAL LOW (ref 0.350–4.500)

## 2020-07-20 LAB — SARS CORONAVIRUS 2 (TAT 6-24 HRS): SARS Coronavirus 2: NEGATIVE

## 2020-07-20 LAB — RAPID URINE DRUG SCREEN, HOSP PERFORMED
Amphetamines: NOT DETECTED
Barbiturates: NOT DETECTED
Benzodiazepines: NOT DETECTED
Cocaine: NOT DETECTED
Opiates: NOT DETECTED
Tetrahydrocannabinol: NOT DETECTED

## 2020-07-20 LAB — LIPID PANEL
Cholesterol: 121 mg/dL (ref 0–200)
HDL: 37 mg/dL — ABNORMAL LOW (ref >40)
LDL Cholesterol: 62 mg/dL (ref 0–99)
Total CHOL/HDL Ratio: 3.3 RATIO
Triglycerides: 110 mg/dL (ref <150)
VLDL: 22 mg/dL (ref 0–40)

## 2020-07-20 LAB — HEMOGLOBIN A1C
Hgb A1c MFr Bld: 6.8 % — ABNORMAL HIGH (ref 4.8–5.6)
Mean Plasma Glucose: 148.46 mg/dL

## 2020-07-20 LAB — T4, FREE: Free T4: 0.96 ng/dL (ref 0.61–1.12)

## 2020-07-20 MED ORDER — EMPAGLIFLOZIN 25 MG PO TABS
25.0000 mg | ORAL_TABLET | Freq: Every day | ORAL | Status: DC
  Administered 2020-07-20: 25 mg via ORAL
  Filled 2020-07-20: qty 1

## 2020-07-20 MED ORDER — HYDROCHLOROTHIAZIDE 12.5 MG PO CAPS
12.5000 mg | ORAL_CAPSULE | Freq: Every day | ORAL | Status: DC
  Administered 2020-07-20: 12.5 mg via ORAL
  Filled 2020-07-20: qty 1

## 2020-07-20 MED ORDER — LINAGLIPTIN 5 MG PO TABS
5.0000 mg | ORAL_TABLET | Freq: Every day | ORAL | Status: DC
  Administered 2020-07-20: 5 mg via ORAL
  Filled 2020-07-20: qty 1

## 2020-07-20 MED ORDER — DULAGLUTIDE 0.75 MG/0.5ML ~~LOC~~ SOAJ: 0.7500 mg | SUBCUTANEOUS | Status: DC

## 2020-07-20 MED ORDER — LISINOPRIL-HYDROCHLOROTHIAZIDE 20-12.5 MG PO TABS: 1.0000 | ORAL_TABLET | Freq: Every day | ORAL | Status: DC

## 2020-07-20 MED ORDER — METOPROLOL TARTRATE 12.5 MG HALF TABLET
12.5000 mg | ORAL_TABLET | Freq: Two times a day (BID) | ORAL | Status: DC
  Administered 2020-07-20: 12.5 mg via ORAL
  Filled 2020-07-20: qty 1

## 2020-07-20 MED ORDER — LISINOPRIL 10 MG PO TABS
20.0000 mg | ORAL_TABLET | Freq: Every day | ORAL | Status: DC
  Administered 2020-07-20: 20 mg via ORAL
  Filled 2020-07-20: qty 2

## 2020-07-20 MED ORDER — CLOPIDOGREL BISULFATE 75 MG PO TABS
75.0000 mg | ORAL_TABLET | Freq: Every day | ORAL | 1 refills | Status: DC
  Filled 2020-07-20 – 2020-10-19 (×4): qty 90, 90d supply, fill #0

## 2020-07-20 MED ORDER — ADULT MULTIVITAMIN W/MINERALS CH
1.0000 | ORAL_TABLET | Freq: Every day | ORAL | Status: DC
  Administered 2020-07-20: 1 via ORAL
  Filled 2020-07-20: qty 1

## 2020-07-20 MED ORDER — ASPIRIN 81 MG PO TABS: 81.0000 mg | ORAL_TABLET | Freq: Every day | ORAL | 0 refills | Status: AC

## 2020-07-20 MED ORDER — ASPIRIN 81 MG PO CHEW
81.0000 mg | CHEWABLE_TABLET | Freq: Every day | ORAL | Status: DC
  Administered 2020-07-20: 81 mg via ORAL
  Filled 2020-07-20: qty 1

## 2020-07-20 MED ORDER — ROSUVASTATIN CALCIUM 5 MG PO TABS
10.0000 mg | ORAL_TABLET | Freq: Every day | ORAL | Status: DC
  Administered 2020-07-20: 10 mg via ORAL
  Filled 2020-07-20: qty 2

## 2020-07-20 NOTE — Progress Notes (Signed)
STROKE TEAM PROGRESS NOTE   SUBJECTIVE (INTERVAL HISTORY) Her husband is at the bedside.  Overall her condition is completely resolved.  Patient sitting at edge of bed, stated that she feels good, symptoms are not resolved.  Eager to go home.   OBJECTIVE Temp:  [98.9 F (37.2 C)-99 F (37.2 C)] 98.9 F (37.2 C) (07/11 1152) Pulse Rate:  [74-94] 74 (07/11 1152) Cardiac Rhythm: Normal sinus rhythm;Bundle branch block (07/11 0821) Resp:  [16-26] 16 (07/11 1152) BP: (99-139)/(47-81) 119/71 (07/11 1152) SpO2:  [89 %-97 %] 95 % (07/11 1152)  Recent Labs  Lab 07/19/20 1040 07/20/20 0608 07/20/20 1156  GLUCAP 183* 126* 91   Recent Labs  Lab 07/19/20 1041 07/19/20 1102  NA 140 141  K 3.5 3.5  CL 105 109  CO2 25  --   GLUCOSE 188* 185*  BUN 11 12  CREATININE 0.74 0.60  CALCIUM 9.5  --    Recent Labs  Lab 07/19/20 1041  AST 21  ALT 21  ALKPHOS 99  BILITOT 0.7  PROT 7.8  ALBUMIN 4.2   Recent Labs  Lab 07/19/20 1041 07/19/20 1102  WBC 7.9  --   NEUTROABS 5.5  --   HGB 13.2 15.3*  HCT 45.3 45.0  MCV 80.3  --   PLT 248  --    No results for input(s): CKTOTAL, CKMB, CKMBINDEX, TROPONINI in the last 168 hours. Recent Labs    07/19/20 1041  LABPROT 12.8  INR 1.0   No results for input(s): COLORURINE, LABSPEC, PHURINE, GLUCOSEU, HGBUR, BILIRUBINUR, KETONESUR, PROTEINUR, UROBILINOGEN, NITRITE, LEUKOCYTESUR in the last 72 hours.  Invalid input(s): APPERANCEUR     Component Value Date/Time   CHOL 121 07/20/2020 0353   TRIG 110 07/20/2020 0353   HDL 37 (L) 07/20/2020 0353   CHOLHDL 3.3 07/20/2020 0353   VLDL 22 07/20/2020 0353   LDLCALC 62 07/20/2020 0353   Lab Results  Component Value Date   HGBA1C 6.8 (H) 07/20/2020      Component Value Date/Time   LABOPIA NONE DETECTED 07/20/2020 1200   COCAINSCRNUR NONE DETECTED 07/20/2020 1200   LABBENZ NONE DETECTED 07/20/2020 1200   AMPHETMU NONE DETECTED 07/20/2020 1200   THCU NONE DETECTED 07/20/2020 1200    LABBARB NONE DETECTED 07/20/2020 1200    No results for input(s): ETH in the last 168 hours.  I have personally reviewed the radiological images below and agree with the radiology interpretations.  CT ANGIO HEAD NECK W WO CM  Result Date: 07/19/2020 CLINICAL DATA:  Vertigo. EXAM: CT ANGIOGRAPHY HEAD AND NECK TECHNIQUE: Multidetector CT imaging of the head and neck was performed using the standard protocol during bolus administration of intravenous contrast. Multiplanar CT image reconstructions and MIPs were obtained to evaluate the vascular anatomy. Carotid stenosis measurements (when applicable) are obtained utilizing NASCET criteria, using the distal internal carotid diameter as the denominator. CONTRAST:  100mL OMNIPAQUE IOHEXOL 350 MG/ML SOLN COMPARISON:  None. FINDINGS: CTA NECK FINDINGS Aortic arch: Calcific atherosclerosis. Great vessel origins are patent. Right carotid system: Motion limited evaluation proximally without visible significant (greater than 50%) stenosis. Mild atherosclerosis at the carotid bifurcation. Left carotid system: Mild atherosclerosis at the carotid bifurcation without significant (greater than 50%) stenosis. Vertebral arteries: Mildly right dominant. Bilateral vertebral arteries are patent without evidence of greater than 50% stenosis. Skeleton: Moderate degenerative disc disease on the left at C5-C6 with disc height loss and endplate spurring. Other neck: No acute findings. Upper chest: Visualized lung apices are clear. Review of  the MIP images confirms the above findings CTA HEAD FINDINGS Anterior circulation: Bilateral intracranial ICAs, MCAs, and ACAs are patent without proximal flow limiting stenosis. Small (1 mm) inferiorly directed outpouching arising from the left supraclinoid ICA (see series 9, image 90) Posterior circulation: Mild atherosclerosis of bilateral intradural vertebral arteries. Bilateral intradural vertebral arteries, basilar artery, and posterior  cerebral arteries are patent without proximal flow limiting stenosis. Small right P1 PCA with posterior communicating artery, anatomic variant. No aneurysm identified. Venous sinuses: As permitted by contrast timing, patent. Anatomic variants: Detailed above. Review of the MIP images confirms the above findings IMPRESSION: 1. No large vessel occlusion or proximal flow limiting stenosis in the head or neck. 2. Small (1 mm) inferiorly directed outpouching arising from the left supraclinoid ICA, which may represent a small aneurysm versus an infundibulum with vessel not visible by CTA. Electronically Signed   By: Feliberto Harts MD   On: 07/19/2020 13:43   CT HEAD WO CONTRAST  Result Date: 07/19/2020 CLINICAL DATA:  Dizziness and off balance.  Right arm tingling. EXAM: CT HEAD WITHOUT CONTRAST TECHNIQUE: Contiguous axial images were obtained from the base of the skull through the vertex without intravenous contrast. COMPARISON:  None. FINDINGS: Brain: Ventricles, cisterns and other CSF spaces are normal. No mass, mass effect, shift of midline structures or acute hemorrhage. No evidence of acute infarction. Vascular: No hyperdense vessel or unexpected calcification. Skull: Normal. Negative for fracture or focal lesion. Sinuses/Orbits: No acute finding. Other: None. IMPRESSION: No acute findings. Electronically Signed   By: Elberta Fortis M.D.   On: 07/19/2020 11:24   MR BRAIN WO CONTRAST  Result Date: 07/19/2020 CLINICAL DATA:  Small left mastoid effusion. EXAM: MRI HEAD WITHOUT CONTRAST TECHNIQUE: Multiplanar, multiecho pulse sequences of the brain and surrounding structures were obtained without intravenous contrast. COMPARISON:  CT head from the same day. FINDINGS: Brain: Small acute infarcts in the dorsal left paramidline pons (series 5, images 62 through 64). Very faint edema without mass effect. Remote small left cerebellar, left thalamic, and bilateral basal ganglia lacunar infarcts. Additional moderate  scattered T2/FLAIR hyperintensities within the white matter, which are nonspecific but potentially related to chronic microvascular ischemic disease. No evidence of hydrocephalus, acute hemorrhage, extra-axial fluid collection, mass lesion, or midline shift. Vascular: Further evaluated on same day CTA. Skull and upper cervical spine: Normal marrow signal. Sinuses/Orbits: Clear sinuses.  Unremarkable orbits. Other: Small left mastoid effusion. IMPRESSION: 1. Small acute infarcts in the dorsal left paramidline pons. Very faint edema without mass effect. 2. Remote small left cerebellar, left thalamic, and bilateral basal ganglia lacunar infarcts. 3. Moderate chronic microvascular ischemic disease. Electronically Signed   By: Feliberto Harts MD   On: 07/19/2020 13:27   ECHOCARDIOGRAM COMPLETE  Result Date: 07/19/2020    ECHOCARDIOGRAM REPORT   Patient Name:   KONYA FAUBLE Date of Exam: 07/19/2020 Medical Rec #:  [id number]      Height:       63.0 in Accession #:    [id number]     Weight:       203.4 lb Date of Birth:  1955/11/19      BSA:          1.948 m Patient Age:    65 years       BP:           197/110 mmHg Patient Gender: F              HR:  83 bpm. Exam Location:  Inpatient Procedure: 2D Echo, Cardiac Doppler and Color Doppler Indications:    Stroke I63.9  History:        Patient has prior history of Echocardiogram examinations, most                 recent 10/21/2014. Risk Factors:Hypertension and Obesity.  Sonographer:    Roosvelt Maser RDCS Referring Phys: [id number] ALLISON WOLFE IMPRESSIONS  1. Left ventricular ejection fraction, by estimation, is 55 to 60%. The left ventricle has normal function. The left ventricle has no regional wall motion abnormalities. Left ventricular diastolic parameters are consistent with Grade I diastolic dysfunction (impaired relaxation).  2. Right ventricular systolic function is normal. The right ventricular size is normal.  3. The mitral valve is normal in structure.  No evidence of mitral valve regurgitation. No evidence of mitral stenosis.  4. The aortic valve is tricuspid. Aortic valve regurgitation is not visualized. Mild aortic valve sclerosis is present, with no evidence of aortic valve stenosis.  5. The inferior vena cava is normal in size with greater than 50% respiratory variability, suggesting right atrial pressure of 3 mmHg. FINDINGS  Left Ventricle: Left ventricular ejection fraction, by estimation, is 55 to 60%. The left ventricle has normal function. The left ventricle has no regional wall motion abnormalities. The left ventricular internal cavity size was normal in size. There is  no left ventricular hypertrophy. Left ventricular diastolic parameters are consistent with Grade I diastolic dysfunction (impaired relaxation). Normal left ventricular filling pressure. Right Ventricle: The right ventricular size is normal. No increase in right ventricular wall thickness. Right ventricular systolic function is normal. Left Atrium: Left atrial size was normal in size. Right Atrium: Right atrial size was normal in size. Pericardium: There is no evidence of pericardial effusion. Mitral Valve: The mitral valve is normal in structure. Mild to moderate mitral annular calcification. No evidence of mitral valve regurgitation. No evidence of mitral valve stenosis. Tricuspid Valve: The tricuspid valve is normal in structure. Tricuspid valve regurgitation is not demonstrated. No evidence of tricuspid stenosis. Aortic Valve: The aortic valve is tricuspid. Aortic valve regurgitation is not visualized. Mild aortic valve sclerosis is present, with no evidence of aortic valve stenosis. Aortic valve mean gradient measures 3.0 mmHg. Aortic valve peak gradient measures 6.2 mmHg. Aortic valve area, by VTI measures 2.33 cm. Pulmonic Valve: The pulmonic valve was normal in structure. Pulmonic valve regurgitation is not visualized. No evidence of pulmonic stenosis. Aorta: The aortic root is  normal in size and structure. Venous: The inferior vena cava is normal in size with greater than 50% respiratory variability, suggesting right atrial pressure of 3 mmHg. IAS/Shunts: No atrial level shunt detected by color flow Doppler.  LEFT VENTRICLE PLAX 2D LVIDd:         3.40 cm  Diastology LVIDs:         2.50 cm  LV e' medial:    6.53 cm/s LV PW:         1.20 cm  LV E/e' medial:  9.6 LV IVS:        1.10 cm  LV e' lateral:   8.92 cm/s LVOT diam:     1.90 cm  LV E/e' lateral: 7.0 LV SV:         49 LV SV Index:   25 LVOT Area:     2.84 cm  RIGHT VENTRICLE RV Basal diam:  2.70 cm LEFT ATRIUM  Index       RIGHT ATRIUM           Index LA diam:        3.40 cm 1.75 cm/m  RA Area:     10.50 cm LA Vol (A2C):   61.1 ml 31.37 ml/m RA Volume:   19.50 ml  10.01 ml/m LA Vol (A4C):   35.3 ml 18.13 ml/m LA Biplane Vol: 48.9 ml 25.11 ml/m  AORTIC VALVE AV Area (Vmax):    2.31 cm AV Area (Vmean):   2.17 cm AV Area (VTI):     2.33 cm AV Vmax:           124.00 cm/s AV Vmean:          82.800 cm/s AV VTI:            0.212 m AV Peak Grad:      6.2 mmHg AV Mean Grad:      3.0 mmHg LVOT Vmax:         101.00 cm/s LVOT Vmean:        63.400 cm/s LVOT VTI:          0.174 m LVOT/AV VTI ratio: 0.82  AORTA Ao Root diam: 2.70 cm MITRAL VALVE MV Area (PHT): 4.80 cm    SHUNTS MV Decel Time: 158 msec    Systemic VTI:  0.17 m MV E velocity: 62.40 cm/s  Systemic Diam: 1.90 cm MV A velocity: 93.30 cm/s MV E/A ratio:  0.67 Mihai Croitoru MD Electronically signed by Thurmon Fair MD Signature Date/Time: 07/19/2020/4:04:13 PM    Final      PHYSICAL EXAM  Temp:  [98.9 F (37.2 C)-99 F (37.2 C)] 98.9 F (37.2 C) (07/11 1152) Pulse Rate:  [74-94] 74 (07/11 1152) Resp:  [16-26] 16 (07/11 1152) BP: (99-139)/(47-81) 119/71 (07/11 1152) SpO2:  [89 %-97 %] 95 % (07/11 1152)  General - Well nourished, well developed, in no apparent distress.  Ophthalmologic - fundi not visualized due to noncooperation.  Cardiovascular -  Regular rhythm and rate.  Mental Status -  Level of arousal and orientation to time, place, and person were intact. Language including expression, naming, repetition, comprehension was assessed and found intact. Fund of Knowledge was assessed and was intact.  Cranial Nerves II - XII - II - Visual field intact OU. III, IV, VI - Extraocular movements intact. V - Facial sensation intact bilaterally. VII - Facial movement intact bilaterally. VIII - Hearing & vestibular intact bilaterally. X - Palate elevates symmetrically. XI - Chin turning & shoulder shrug intact bilaterally. XII - Tongue protrusion intact.  Motor Strength - The patient's strength was normal in all extremities and pronator drift was absent.  Bulk was normal and fasciculations were absent.   Motor Tone - Muscle tone was assessed at the neck and appendages and was normal.  Reflexes - The patient's reflexes were symmetrical in all extremities and she had no pathological reflexes.  Sensory - Light touch, temperature/pinprick were assessed and were symmetrical.    Coordination - The patient had normal movements in the hands and feet with no ataxia or dysmetria.  Tremor was absent.  Gait and Station - deferred.   ASSESSMENT/PLAN Ms. MONIQUE HEFTY is a 65 y.o. female with history of hypertension, hyperlipidemia, diabetes, CHF admitted for lightheaded, imbalance. No tPA given due to outside window.    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 Patient counseled to be compliant with her antithrombotic medications Ongoing aggressive stroke  risk factor management Therapy recommendations: None Disposition: Home  Diabetes HgbA1c 6.8 goal < 7.0 Controlled CBG monitoring SSI DM education and close PCP follow up  Hypertension Stable Long term BP goal normotensive  Hyperlipidemia Home meds: Crestor 10 LDL 62, goal < 70 Now on Crestor 10 Continue statin at discharge  Other Stroke Risk Factors Advanced age Obesity CHF  Other Active Problems   Hospital day # 0  Neurology will sign off. Please call with questions. Pt will follow up with stroke clinic NP at [facility] in about 4 weeks. Thanks for the consult.   Marvel Plan, MD PhD Stroke Neurology 07/20/2020 8:28 PM    To contact Stroke Continuity provider, please refer to [email or web address]. After hours, contact General Neurology

## 2020-07-20 NOTE — Evaluation (Signed)
Occupational Therapy Evaluation/Discharge Patient Details Name: Brittany [name] MRN: [id number] DOB: 19-Apr-1955 Today's Date: 07/20/2020    History of Present Illness Brittany [name] is a 65 y.o. female who presented to ER for feeling off balance overnight. MRI shows small acute infarcts in dorsal left paramidline pons. Remote cerebellar, thalamic, and B basal ganglia infarcts noted as well. PMH: DMII, HTN, HLD, CHF   Clinical Impression   PTA, pt lives with spouse and reports Independence in all daily tasks without use of AD. Pt presents now feeling much better with improving dynamic standing balance. Pt able to demo ADLs independently, as well as hallway mobility and stair training via L handrail without assistance. No overt LOB noted though pt with mild unsteadiness that improves with increased activity. Pt denies any other concerns. Anticipate pt to return quickly to normal activities without need for OT follow-up. Encouraged safety strategies and family support as needed initially. OT to sign off at acute level with no further therapy needs indicated. VSS on RA.    Follow Up Recommendations  No OT follow up    Equipment Recommendations  None recommended by OT    Recommendations for Other Services       Precautions / Restrictions Precautions Precautions: Fall Restrictions Weight Bearing Restrictions: No      Mobility Bed Mobility Overal bed mobility: Independent                  Transfers Overall transfer level: Independent Equipment used: None                  Balance Overall balance assessment: Mild deficits observed, not formally tested                                         ADL either performed or assessed with clinical judgement   ADL Overall ADL's : Independent                                       General ADL Comments: able to mobilize to/from bathroom, complete toileting task, hallway mobility and flight of  steps without assistance. No use of AD, mild unsteadiness that improved with activity. Educated/encouraged safety strategies at home (family present during initial showering tasks, etc)     Vision Patient Visual Report: No change from baseline Vision Assessment?: No apparent visual deficits     Perception     Praxis      Pertinent Vitals/Pain Pain Assessment: No/denies pain     Hand Dominance Right   Extremity/Trunk Assessment Upper Extremity Assessment Upper Extremity Assessment: Overall WFL for tasks assessed   Lower Extremity Assessment Lower Extremity Assessment: Overall WFL for tasks assessed   Cervical / Trunk Assessment Cervical / Trunk Assessment: Normal   Communication Communication Communication: No difficulties   Cognition Arousal/Alertness: Awake/alert Behavior During Therapy: WFL for tasks assessed/performed Overall Cognitive Status: Within Functional Limits for tasks assessed                                     General Comments  BP 134/78, denies lightheadedness with activity    Exercises     Shoulder Instructions      Home Living Family/patient expects to be discharged to::  Private residence Living Arrangements: Spouse/significant other Available Help at Discharge: Family;Available PRN/intermittently Type of Home: House Home Access: Stairs to enter [employer] of Steps: 4 Entrance Stairs-Rails: Left Home Layout: One level     Bathroom Shower/Tub: Chief Strategy Officer: Standard     Home Equipment: None          Prior Functioning/Environment Level of Independence: Independent        Comments: no use of AD for mobility, independent with ADLs/IADLs        OT Problem List:        OT Treatment/Interventions:      OT Goals(Current goals can be found in the care plan section) Acute Rehab OT Goals Patient Stated Goal: increase mobility to improve balance, return home OT Goal Formulation: All  assessment and education complete, DC therapy  OT Frequency:     Barriers to D/C:            Co-evaluation              AM-PAC OT "6 Clicks" Daily Activity     Outcome Measure Help from another person eating meals?: None Help from another person taking care of personal grooming?: None Help from another person toileting, which includes using toliet, bedpan, or urinal?: None Help from another person bathing (including washing, rinsing, drying)?: None Help from another person to put on and taking off regular upper body clothing?: None Help from another person to put on and taking off regular lower body clothing?: None 6 Click Score: 24   End of Session Equipment Utilized During Treatment: Gait belt  Activity Tolerance: Patient tolerated treatment well Patient left: in bed;with call bell/phone within reach  OT Visit Diagnosis: Unsteadiness on feet (R26.81)                Time: 2952-8413 OT Time Calculation (min): 18 min Charges:  OT General Charges $OT Visit: 1 Visit OT Evaluation $OT Eval Low Complexity: 1 Low  Bradd Canary, OTR/L Acute Rehab Services Office: [phone]   Lorre Munroe 07/20/2020, 7:26 AM

## 2020-07-20 NOTE — Progress Notes (Signed)
Mobility Specialist: Progress Note   07/20/20 1052  Mobility  Activity Ambulated in hall  Level of Assistance Contact guard assist, steadying assist  Assistive Device None  Distance Ambulated (ft) 1200 ft  Mobility Ambulated with assistance in hallway  Mobility Response Tolerated well  Mobility performed by Mobility specialist  $Mobility charge 1 Mobility   Pre-Mobility: 83 HR, 94% SpO2 During Mobility: 128 HR Post-Mobility: 95 HR, 120/72 BP, 99% SpO2  Pt required contact guard throughout ambulation d/t lateral unsteadiness. Pt c/o blurry vision upon returning to room but said it was minimal, VSS. Pt is sitting EOB with call bell at her side and family member present in the room.   [facility] Mardee Clune Mobility Specialist Mobility Specialist Phone: [phone]

## 2020-07-20 NOTE — Progress Notes (Signed)
PT Cancellation Note  Patient Details Name: Brittany [name] MRN: [id number] DOB: 11-21-1955   Cancelled Treatment:    Reason Eval/Treat Not Completed: PT screened, no needs identified, will sign off - Per OT, Pt is at baseline level of functioning, mobilizing well in hallways. PT to sign off, please reconsult if needed.   Marye Round, PT DPT Acute Rehabilitation Services Pager [phone]  Office [phone]    Truddie Coco 07/20/2020, 8:17 AM

## 2020-07-20 NOTE — Discharge Summary (Signed)
Physician Discharge Summary  Brittany [name] MWU:[id number] DOB: Nov 08, 1955 DOA: 07/19/2020  PCP: Alroy Dust, L.Marlou Sa, MD  Admit date: 07/19/2020 Discharge date: 07/20/2020  Admitted From: Home Disposition: Home  Recommendations for Outpatient Follow-up:  Follow ups as below. Please obtain CBC/BMP/Mag at follow up Please follow up on the following pending results: Free T4  Home Health: Not indicated Equipment/Devices: Not indicated  Discharge Condition: Stable CODE STATUS: Full code   Follow-up Information     Alroy Dust, L.Marlou Sa, MD. Schedule an appointment as soon as possible for a visit in 1 week(s).   Specialty: Family Medicine Contact information: [address] [place] [address] [phone]                 Hospital Course: 65 year old F with PMH of NIDDM-2, diastolic CHF, HTN and HLD presented after feeling off balance and admitted for acute dorsal left paramedian pontine infarct as noted on MRI brain.  She was outside the window for tPA.  Further CVA work-up including CTA head and neck and TTE without significant finding.  UDS was negative.  A1c was 6.8%.  LDL was 62.  TSH was slightly low at 0.347.  Free T4 was pending at the time of discharge.  The next day, patient felt well.  Symptoms resolved and neuro exam basically normal.  Neurology recommended DAPT with Plavix and low-dose aspirin followed by Plavix alone.  She is already on Crestor.  Evaluated by therapy and no needles identified.  See individual problem list below for more on hospital course.  Discharge Diagnoses:  Acute left paramedian pontine infarct-noted on MRI.  Further neuro work-up as above. -Appreciate neuro recommendation -DAPT with low-dose aspirin and Plavix for 3 weeks followed by Plavix alone -Continue home Crestor   Controlled NIDDM-2 with microvascular complication: U4Q 0.3%. Recent Labs  Lab 07/19/20 1040 07/20/20 0608 07/20/20 1156  GLUCAP 183* 126* 91   -Continue home metformin, Januvia, dulaglutide and Crestor  Essential hypertension: Normotensive -Continue home meds.  Chronic diastolic CHF?  TTE with LVEF of 60 to 65% and G1 DD.  Appears euvolemic.  No cardiopulmonary symptoms.  Not on diuretics other than HCTZ for HTN. -Emphasize risk reduction  Low TSH: TSH was 0.347.  Difficult to interpret in acute setting.  No clinical signs of hyperthyroidism. -Free T4 pending -Consider repeat TSH in 4 to 6 weeks   Morbid obesity in diabetic patient: BMI 36.1 -Encourage lifestyle change to lose weight.        Discharge Exam: Vitals:   07/20/20 0841 07/20/20 1152  BP: 139/71 119/71  Pulse: 87 74  Resp: 18 16  Temp:  98.9 F (37.2 C)  SpO2: 96% 95%    GENERAL: No apparent distress.  Nontoxic. HEENT: MMM.  Vision and hearing grossly intact.  NECK: Supple.  No apparent JVD.  RESP: On RA.  No IWOB.  Fair aeration bilaterally. CVS:  RRR. Heart sounds normal.  ABD/GI/GU: Bowel sounds present. Soft. Non tender.  MSK/EXT:  Moves extremities. No apparent deformity. No edema.  SKIN: no apparent skin lesion or wound NEURO: Awake, alert and oriented appropriately.  No apparent focal neuro deficit. PSYCH: Calm. Normal affect.   Discharge Instructions  Discharge Instructions     Diet - low sodium heart healthy   Complete by: As directed    Diet Carb Modified   Complete by: As directed    Discharge instructions   Complete by: As directed    It has been a pleasure taking care of  you!  You were hospitalized due to stroke as noted on your MRI.  Your symptoms resolved to the point we think it is safe to let you go home.  We are discharging you on Plavix and aspirin for 3 weeks followed by Plavix alone to reduce your risk of stroke in the future.  Continue taking your Crestor.  Please follow-up with your primary care doctor in 1 to 2 weeks or sooner if needed.  Please review your new medication list and the directions on your medications  before you take them.   Take care,   Increase activity slowly   Complete by: As directed       Allergies as of 07/20/2020       Reactions   Pioglitazone Other (See Comments)   Headaches, myalgias, chest pain, and dizziness   Canagliflozin Palpitations   Statins Nausea Only        Medication List     STOP taking these medications    Ozempic (0.25 or 0.5 MG/DOSE) 2 MG/1.5ML Sopn Generic drug: Semaglutide(0.25 or 0.5MG/DOS)       TAKE these medications    Accu-Chek Guide test strip Generic drug: glucose blood Use 1 strip finger stick up to three times a day to check blood sugar DXE11.69   FREESTYLE LITE test strip Generic drug: glucose blood Use as directed up to 3 times daily   Accu-Chek Guide w/Device Kit use for blood sugar check fingerstick three times a day DX E11.69   FreeStyle Lite w/Device Kit Use as directed three times daily   acetaminophen 500 MG tablet Commonly known as: TYLENOL Take 500 mg by mouth every 6 (six) hours as needed for mild pain.   aspirin 81 MG tablet Take 1 tablet (81 mg total) by mouth daily for 21 days.   clopidogrel 75 MG tablet Commonly known as: PLAVIX Take 1 tablet (75 mg total) by mouth daily. Start taking on: July 21, 2020   dextromethorphan-guaiFENesin 30-600 MG 12hr tablet Commonly known as: MUCINEX DM Take 1 tablet by mouth 2 (two) times daily as needed for cough.   freestyle lancets Use as directed up to 3 times daily   Januvia 100 MG tablet Generic drug: sitaGLIPtin Take 1 tablet (100 mg total) by mouth daily. What changed: Another medication with the same name was removed. Continue taking this medication, and follow the directions you see here.   Jardiance 25 MG Tabs tablet Generic drug: empagliflozin Take 1 tablet (25 mg total) by mouth daily.   Lancets Misc. Misc Use as directed 3 times daily   lisinopril-hydrochlorothiazide 20-12.5 MG tablet Commonly known as: ZESTORETIC TAKE 1 TABLET BY MOUTH  ONCE DAILY.   metFORMIN 500 MG tablet Commonly known as: GLUCOPHAGE Take 2 tablets (1,000 mg total) by mouth 2 (two) times daily.   metoprolol tartrate 25 MG tablet Commonly known as: LOPRESSOR TAKE 1/2 TABLET BY MOUTH TWO TIMES DAILY WITH FOOD What changed:  how much to take how to take this when to take this   ONE-A-DAY WOMENS PO Take 1 tablet by mouth daily.   rosuvastatin 10 MG tablet Commonly known as: CRESTOR Take 1 tablet (10 mg total) by mouth daily.   triamcinolone cream 0.1 % Commonly known as: KENALOG Apply 1 application topically 2 (two) times daily as needed (for itching).       ASK your doctor about these medications    HYDROcodone-acetaminophen 5-325 MG tablet Commonly known as: NORCO/VICODIN Take 1 tablet by mouth every 4 (four) hours  as needed.   Trulicity 7.00 FV/4.9SW Sopn Generic drug: Dulaglutide INJECT 1 PEN INTO THE SKIN ONCE A WEEK.        Consultations: Neurology  Procedures/Studies:   CT ANGIO HEAD NECK W WO CM  Result Date: 07/19/2020 CLINICAL DATA:  Vertigo. EXAM: CT ANGIOGRAPHY HEAD AND NECK TECHNIQUE: Multidetector CT imaging of the head and neck was performed using the standard protocol during bolus administration of intravenous contrast. Multiplanar CT image reconstructions and MIPs were obtained to evaluate the vascular anatomy. Carotid stenosis measurements (when applicable) are obtained utilizing NASCET criteria, using the distal internal carotid diameter as the denominator. CONTRAST:  169m OMNIPAQUE IOHEXOL 350 MG/ML SOLN COMPARISON:  None. FINDINGS: CTA NECK FINDINGS Aortic arch: Calcific atherosclerosis. Great vessel origins are patent. Right carotid system: Motion limited evaluation proximally without visible significant (greater than 50%) stenosis. Mild atherosclerosis at the carotid bifurcation. Left carotid system: Mild atherosclerosis at the carotid bifurcation without significant (greater than 50%) stenosis. Vertebral  arteries: Mildly right dominant. Bilateral vertebral arteries are patent without evidence of greater than 50% stenosis. Skeleton: Moderate degenerative disc disease on the left at C5-C6 with disc height loss and endplate spurring. Other neck: No acute findings. Upper chest: Visualized lung apices are clear. Review of the MIP images confirms the above findings CTA HEAD FINDINGS Anterior circulation: Bilateral intracranial ICAs, MCAs, and ACAs are patent without proximal flow limiting stenosis. Small (1 mm) inferiorly directed outpouching arising from the left supraclinoid ICA (see series 9, image 90) Posterior circulation: Mild atherosclerosis of bilateral intradural vertebral arteries. Bilateral intradural vertebral arteries, basilar artery, and posterior cerebral arteries are patent without proximal flow limiting stenosis. Small right P1 PCA with posterior communicating artery, anatomic variant. No aneurysm identified. Venous sinuses: As permitted by contrast timing, patent. Anatomic variants: Detailed above. Review of the MIP images confirms the above findings IMPRESSION: 1. No large vessel occlusion or proximal flow limiting stenosis in the head or neck. 2. Small (1 mm) inferiorly directed outpouching arising from the left supraclinoid ICA, which may represent a small aneurysm versus an infundibulum with vessel not visible by CTA. Electronically Signed   By: FMargaretha SheffieldMD   On: 07/19/2020 13:43   CT HEAD WO CONTRAST  Result Date: 07/19/2020 CLINICAL DATA:  Dizziness and off balance.  Right arm tingling. EXAM: CT HEAD WITHOUT CONTRAST TECHNIQUE: Contiguous axial images were obtained from the base of the skull through the vertex without intravenous contrast. COMPARISON:  None. FINDINGS: Brain: Ventricles, cisterns and other CSF spaces are normal. No mass, mass effect, shift of midline structures or acute hemorrhage. No evidence of acute infarction. Vascular: No hyperdense vessel or unexpected  calcification. Skull: Normal. Negative for fracture or focal lesion. Sinuses/Orbits: No acute finding. Other: None. IMPRESSION: No acute findings. Electronically Signed   By: DMarin OlpM.D.   On: 07/19/2020 11:24   MR BRAIN WO CONTRAST  Result Date: 07/19/2020 CLINICAL DATA:  Small left mastoid effusion. EXAM: MRI HEAD WITHOUT CONTRAST TECHNIQUE: Multiplanar, multiecho pulse sequences of the brain and surrounding structures were obtained without intravenous contrast. COMPARISON:  CT head from the same day. FINDINGS: Brain: Small acute infarcts in the dorsal left paramidline pons (series 5, images 62 through 64). Very faint edema without mass effect. Remote small left cerebellar, left thalamic, and bilateral basal ganglia lacunar infarcts. Additional moderate scattered T2/FLAIR hyperintensities within the white matter, which are nonspecific but potentially related to chronic microvascular ischemic disease. No evidence of hydrocephalus, acute hemorrhage, extra-axial fluid collection, mass lesion,  or midline shift. Vascular: Further evaluated on same day CTA. Skull and upper cervical spine: Normal marrow signal. Sinuses/Orbits: Clear sinuses.  Unremarkable orbits. Other: Small left mastoid effusion. IMPRESSION: 1. Small acute infarcts in the dorsal left paramidline pons. Very faint edema without mass effect. 2. Remote small left cerebellar, left thalamic, and bilateral basal ganglia lacunar infarcts. 3. Moderate chronic microvascular ischemic disease. Electronically Signed   By: Margaretha Sheffield MD   On: 07/19/2020 13:27   ECHOCARDIOGRAM COMPLETE  Result Date: 07/19/2020    ECHOCARDIOGRAM REPORT   Patient Name:   Brittany [name] Date of Exam: 07/19/2020 Medical Rec #:  [id number]      Height:       63.0 in Accession #:    [id number]     Weight:       203.4 lb Date of Birth:  1955-08-28      BSA:          1.948 m Patient Age:    65 years       BP:           197/110 mmHg Patient Gender: F              HR:            83 bpm. Exam Location:  Inpatient Procedure: 2D Echo, Cardiac Doppler and Color Doppler Indications:    Stroke I63.9  History:        Patient has prior history of Echocardiogram examinations, most                 recent 10/21/2014. Risk Factors:Hypertension and Obesity.  Sonographer:    Merrie Roof RDCS Referring Phys: [id number] [place]  1. Left ventricular ejection fraction, by estimation, is 55 to 60%. The left ventricle has normal function. The left ventricle has no regional wall motion abnormalities. Left ventricular diastolic parameters are consistent with Grade I diastolic dysfunction (impaired relaxation).  2. Right ventricular systolic function is normal. The right ventricular size is normal.  3. The mitral valve is normal in structure. No evidence of mitral valve regurgitation. No evidence of mitral stenosis.  4. The aortic valve is tricuspid. Aortic valve regurgitation is not visualized. Mild aortic valve sclerosis is present, with no evidence of aortic valve stenosis.  5. The inferior vena cava is normal in size with greater than 50% respiratory variability, suggesting right atrial pressure of 3 mmHg. FINDINGS  Left Ventricle: Left ventricular ejection fraction, by estimation, is 55 to 60%. The left ventricle has normal function. The left ventricle has no regional wall motion abnormalities. The left ventricular internal cavity size was normal in size. There is  no left ventricular hypertrophy. Left ventricular diastolic parameters are consistent with Grade I diastolic dysfunction (impaired relaxation). Normal left ventricular filling pressure. Right Ventricle: The right ventricular size is normal. No increase in right ventricular wall thickness. Right ventricular systolic function is normal. Left Atrium: Left atrial size was normal in size. Right Atrium: Right atrial size was normal in size. Pericardium: There is no evidence of pericardial effusion. Mitral Valve: The mitral valve is  normal in structure. Mild to moderate mitral annular calcification. No evidence of mitral valve regurgitation. No evidence of mitral valve stenosis. Tricuspid Valve: The tricuspid valve is normal in structure. Tricuspid valve regurgitation is not demonstrated. No evidence of tricuspid stenosis. Aortic Valve: The aortic valve is tricuspid. Aortic valve regurgitation is not visualized. Mild aortic valve sclerosis is present, with no evidence of aortic valve stenosis.  Aortic valve mean gradient measures 3.0 mmHg. Aortic valve peak gradient measures 6.2 mmHg. Aortic valve area, by VTI measures 2.33 cm. Pulmonic Valve: The pulmonic valve was normal in structure. Pulmonic valve regurgitation is not visualized. No evidence of pulmonic stenosis. Aorta: The aortic root is normal in size and structure. Venous: The inferior vena cava is normal in size with greater than 50% respiratory variability, suggesting right atrial pressure of 3 mmHg. IAS/Shunts: No atrial level shunt detected by color flow Doppler.  LEFT VENTRICLE PLAX 2D LVIDd:         3.40 cm  Diastology LVIDs:         2.50 cm  LV e' medial:    6.53 cm/s LV PW:         1.20 cm  LV E/e' medial:  9.6 LV IVS:        1.10 cm  LV e' lateral:   8.92 cm/s LVOT diam:     1.90 cm  LV E/e' lateral: 7.0 LV SV:         49 LV SV Index:   25 LVOT Area:     2.84 cm  RIGHT VENTRICLE RV Basal diam:  2.70 cm LEFT ATRIUM             Index       RIGHT ATRIUM           Index LA diam:        3.40 cm 1.75 cm/m  RA Area:     10.50 cm LA Vol (A2C):   61.1 ml 31.37 ml/m RA Volume:   19.50 ml  10.01 ml/m LA Vol (A4C):   35.3 ml 18.13 ml/m LA Biplane Vol: 48.9 ml 25.11 ml/m  AORTIC VALVE AV Area (Vmax):    2.31 cm AV Area (Vmean):   2.17 cm AV Area (VTI):     2.33 cm AV Vmax:           124.00 cm/s AV Vmean:          82.800 cm/s AV VTI:            0.212 m AV Peak Grad:      6.2 mmHg AV Mean Grad:      3.0 mmHg LVOT Vmax:         101.00 cm/s LVOT Vmean:        63.400 cm/s LVOT VTI:           0.174 m LVOT/AV VTI ratio: 0.82  AORTA Ao Root diam: 2.70 cm MITRAL VALVE MV Area (PHT): 4.80 cm    SHUNTS MV Decel Time: 158 msec    Systemic VTI:  0.17 m MV E velocity: 62.40 cm/s  Systemic Diam: 1.90 cm MV A velocity: 93.30 cm/s MV E/A ratio:  0.67 Mihai Croitoru MD Electronically signed by Sanda Klein MD Signature Date/Time: 07/19/2020/4:04:13 PM    Final        The results of significant diagnostics from this hospitalization (including imaging, microbiology, ancillary and laboratory) are listed below for reference.     Microbiology: Recent Results (from the past 240 hour(s))  SARS CORONAVIRUS 2 (TAT 6-24 HRS) Nasopharyngeal Nasopharyngeal Swab     Status: None   Collection Time: 07/20/20  5:04 AM   Specimen: Nasopharyngeal Swab  Result Value Ref Range Status   SARS Coronavirus 2 NEGATIVE NEGATIVE Final    Comment: (NOTE) SARS-CoV-2 target nucleic acids are NOT DETECTED.  The SARS-CoV-2 RNA is generally detectable in upper and lower respiratory specimens  during the acute phase of infection. Negative results do not preclude SARS-CoV-2 infection, do not rule out co-infections with other pathogens, and should not be used as the sole basis for treatment or other patient management decisions. Negative results must be combined with clinical observations, patient history, and epidemiological information. The expected result is Negative.  Fact Sheet for Patients: SugarRoll.be  Fact Sheet for Healthcare Providers: https://www.woods-mathews.com/  This test is not yet approved or cleared by the [place] FDA and  has been authorized for detection and/or diagnosis of SARS-CoV-2 by FDA under an Emergency Use Authorization (EUA). This EUA will remain  in effect (meaning this test can be used) for the duration of the COVID-19 declaration under Se ction 564(b)(1) of the Act, 21 U.S.C. section 360bbb-3(b)(1), unless the authorization is  terminated or revoked sooner.  Performed at [facility], [place] [address], [place],  [address]      Labs:  CBC: Recent Labs  Lab 07/19/20 1041 07/19/20 1102  WBC 7.9  --   NEUTROABS 5.5  --   HGB 13.2 15.3*  HCT 45.3 45.0  MCV 80.3  --   PLT 248  --    BMP &GFR Recent Labs  Lab 07/19/20 1041 07/19/20 1102  NA 140 141  K 3.5 3.5  CL 105 109  CO2 25  --   GLUCOSE 188* 185*  BUN 11 12  CREATININE 0.74 0.60  CALCIUM 9.5  --    CrCl cannot be calculated (Unknown ideal weight.). Liver & Pancreas: Recent Labs  Lab 07/19/20 1041  AST 21  ALT 21  ALKPHOS 99  BILITOT 0.7  PROT 7.8  ALBUMIN 4.2   No results for input(s): LIPASE, AMYLASE in the last 168 hours. No results for input(s): AMMONIA in the last 168 hours. Diabetic: Recent Labs    07/20/20 0353  HGBA1C 6.8*   Recent Labs  Lab 07/19/20 1040 07/20/20 0608 07/20/20 1156  GLUCAP 183* 126* 91   Cardiac Enzymes: No results for input(s): CKTOTAL, CKMB, CKMBINDEX, TROPONINI in the last 168 hours. No results for input(s): PROBNP in the last 8760 hours. Coagulation Profile: Recent Labs  Lab 07/19/20 1041  INR 1.0   Thyroid Function Tests: Recent Labs    07/20/20 0353  TSH 0.347*   Lipid Profile: Recent Labs    07/20/20 0353  CHOL 121  HDL 37*  LDLCALC 62  TRIG 110  CHOLHDL 3.3   Anemia Panel: No results for input(s): VITAMINB12, FOLATE, FERRITIN, TIBC, IRON, RETICCTPCT in the last 72 hours. Urine analysis:    Component Value Date/Time   COLORURINE YELLOW 10/25/2014 2009   APPEARANCEUR CLOUDY (A) 10/25/2014 2009   LABSPEC 1.025 10/25/2014 2009   PHURINE 6.0 10/25/2014 2009   GLUCOSEU >1000 (A) 10/25/2014 2009   HGBUR TRACE (A) 10/25/2014 2009   BILIRUBINUR NEGATIVE 10/25/2014 2009   KETONESUR 40 (A) 10/25/2014 2009   PROTEINUR NEGATIVE 10/25/2014 2009   UROBILINOGEN 0.2 10/25/2014 2009   NITRITE NEGATIVE 10/25/2014 2009   LEUKOCYTESUR SMALL (A) 10/25/2014 2009    Sepsis Labs: Invalid input(s): PROCALCITONIN, LACTICIDVEN   Time coordinating discharge: 35 minutes  SIGNED:  Mercy Riding, MD  [facility] 07/20/2020, 3:13 PM  If 7PM-7AM, please contact night-coverage [email or web address]

## 2020-07-20 NOTE — Progress Notes (Signed)
Discharge instructions given to patient. Husband present. PIV removed. Patient taken to vehicle in wheelchair by staff.  Kenard Gower, RN

## 2020-07-21 ENCOUNTER — Emergency Department (HOSPITAL_COMMUNITY): Payer: HMO

## 2020-07-21 ENCOUNTER — Emergency Department (HOSPITAL_COMMUNITY)
Admission: EM | Admit: 2020-07-21 | Discharge: 2020-07-21 | Disposition: A | Discharge status: Home / Self Care | Payer: HMO | Attending: Emergency Medicine | Admitting: Emergency Medicine

## 2020-07-21 ENCOUNTER — Encounter (HOSPITAL_COMMUNITY): Payer: Self-pay | Admitting: *Deleted

## 2020-07-21 DIAGNOSIS — Z7982 Long term (current) use of aspirin: Secondary | ICD-10-CM | POA: Insufficient documentation

## 2020-07-21 DIAGNOSIS — I5032 Chronic diastolic (congestive) heart failure: Secondary | ICD-10-CM | POA: Insufficient documentation

## 2020-07-21 DIAGNOSIS — Z8673 Personal history of transient ischemic attack (TIA), and cerebral infarction without residual deficits: Secondary | ICD-10-CM

## 2020-07-21 DIAGNOSIS — H1032 Unspecified acute conjunctivitis, left eye: Secondary | ICD-10-CM

## 2020-07-21 DIAGNOSIS — R531 Weakness: Secondary | ICD-10-CM | POA: Insufficient documentation

## 2020-07-21 DIAGNOSIS — Z79899 Other long term (current) drug therapy: Secondary | ICD-10-CM | POA: Insufficient documentation

## 2020-07-21 DIAGNOSIS — Z87891 Personal history of nicotine dependence: Secondary | ICD-10-CM | POA: Diagnosis not present

## 2020-07-21 DIAGNOSIS — Z794 Long term (current) use of insulin: Secondary | ICD-10-CM | POA: Insufficient documentation

## 2020-07-21 DIAGNOSIS — R42 Dizziness and giddiness: Secondary | ICD-10-CM | POA: Diagnosis not present

## 2020-07-21 DIAGNOSIS — E119 Type 2 diabetes mellitus without complications: Secondary | ICD-10-CM | POA: Insufficient documentation

## 2020-07-21 DIAGNOSIS — Z7984 Long term (current) use of oral hypoglycemic drugs: Secondary | ICD-10-CM | POA: Diagnosis not present

## 2020-07-21 DIAGNOSIS — I110 Hypertensive heart disease with heart failure: Secondary | ICD-10-CM | POA: Diagnosis not present

## 2020-07-21 DIAGNOSIS — Z7902 Long term (current) use of antithrombotics/antiplatelets: Secondary | ICD-10-CM | POA: Diagnosis not present

## 2020-07-21 DIAGNOSIS — L539 Erythematous condition, unspecified: Secondary | ICD-10-CM | POA: Insufficient documentation

## 2020-07-21 LAB — BASIC METABOLIC PANEL
Anion gap: 10 (ref 5–15)
BUN: 13 mg/dL (ref 8–23)
CO2: 28 mmol/L (ref 22–32)
Calcium: 9.3 mg/dL (ref 8.9–10.3)
Chloride: 103 mmol/L (ref 98–111)
Creatinine, Ser: 0.77 mg/dL (ref 0.44–1.00)
GFR, Estimated: >60 mL/min (ref >60)
Glucose, Bld: 142 mg/dL — ABNORMAL HIGH (ref 70–99)
Potassium: 4.2 mmol/L (ref 3.5–5.1)
Sodium: 141 mmol/L (ref 135–145)

## 2020-07-21 LAB — CBC WITH DIFFERENTIAL/PLATELET
Abs Immature Granulocytes: 0.02 10*3/uL (ref 0.00–0.07)
Basophils Absolute: 0.1 10*3/uL (ref 0.0–0.1)
Basophils Relative: 1 %
Eosinophils Absolute: 0.3 10*3/uL (ref 0.0–0.5)
Eosinophils Relative: 4 %
HCT: 45.2 % (ref 36.0–46.0)
Hemoglobin: 13.6 g/dL (ref 12.0–15.0)
Immature Granulocytes: 0 %
Lymphocytes Relative: 30 %
Lymphs Abs: 1.8 10*3/uL (ref 0.7–4.0)
MCH: 24.2 pg — ABNORMAL LOW (ref 26.0–34.0)
MCHC: 30.1 g/dL (ref 30.0–36.0)
MCV: 80.6 fL (ref 80.0–100.0)
Monocytes Absolute: 0.6 10*3/uL (ref 0.1–1.0)
Monocytes Relative: 9 %
Neutro Abs: 3.5 10*3/uL (ref 1.7–7.7)
Neutrophils Relative %: 56 %
Platelets: 251 10*3/uL (ref 150–400)
RBC: 5.61 MIL/uL — ABNORMAL HIGH (ref 3.87–5.11)
RDW: 14.9 % (ref 11.5–15.5)
WBC: 6.2 10*3/uL (ref 4.0–10.5)
nRBC: 0 % (ref 0.0–0.2)

## 2020-07-21 LAB — PROTIME-INR
INR: 1 (ref 0.8–1.2)
Prothrombin Time: 12.7 seconds (ref 11.4–15.2)

## 2020-07-21 MED ORDER — TOBRAMYCIN 0.3 % OP SOLN
2.0000 [drp] | Freq: Once | OPHTHALMIC | Status: AC
  Administered 2020-07-21: 2 [drp] via OPHTHALMIC
  Filled 2020-07-21: qty 5

## 2020-07-21 MED ORDER — TETRACAINE HCL 0.5 % OP SOLN
2.0000 [drp] | Freq: Once | OPHTHALMIC | Status: AC
  Administered 2020-07-21: 2 [drp] via OPHTHALMIC
  Filled 2020-07-21: qty 4

## 2020-07-21 NOTE — ED Notes (Signed)
Pt transported to MRI 

## 2020-07-21 NOTE — ED Triage Notes (Signed)
Pt discharged from the hospital yesterday after stroke workup and told she's had old strokes. Woke this am at 0300 without symptoms. Woke again at 0730 and felt weak on right side. Strength equal. No facial droop noted. Pt is in wheelchair as she still feels unsteady.

## 2020-07-21 NOTE — ED Provider Notes (Signed)
Emergency Medicine Provider Triage Evaluation Note  Brittany [name] , [name] 65 y.o. female  was evaluated in triage.  Pt complains of weakness. Worse to right side. Started at [employer]. Dc yesterday for CVA from here. Felt fine at dc. No slurred speech, numbness, facial droop.  Review of Systems  Positive: Weakness Negative: Numbness, facial droop  Physical Exam  BP 106/76   Pulse 87   Temp 98.6 F (37 C) (Oral)   Resp 14   SpO2 97%  Gen:   Awake, no distress   Resp:  Normal effort  MSK:   Moves extremities without difficulty  Other:  Cn 2-12 grossly intact, equal grip bilatearly, equal sensation  Medical Decision Making  Medically screening exam initiated at 8:24 AM.  Appropriate orders placed.  Brittany [name] was informed that the remainder of the evaluation will be completed by another provider, this initial triage assessment does not replace that evaluation, and the importance of remaining in the ED until their evaluation is complete.  Weakness  Not [name] code stroke.  Nursing notified patient needs room in back   Brittany Hefferan A, PA-C 07/21/20 0825    Brittany Laine, MD 07/22/20 [phone]

## 2020-07-21 NOTE — ED Notes (Signed)
Pt ambulated to the bathroom again on her own power, pt has tolerated water and diet ginger ale.

## 2020-07-21 NOTE — ED Provider Notes (Addendum)
[facility] EMERGENCY DEPARTMENT Provider Note   CSN: [id number] Arrival date & time: 07/21/20  0813     History Chief Complaint  Patient presents with   Weakness    Brittany [name] is a 65 y.o. female.  Patient with recent admission for cva presents indicating when got up this AM felt somewhat off balance and mildly lightheaded. States had went to bed last night and felt at baseline, but new symptoms around 7 am today. No fall. No syncope. Denies headache. No neck pain. No chest pain or discomfort. No sob or unusual doe. Denies palpitations. No abd pain or nvd. No dysuria or gu c/o. No recent blood loss, rectal bleeding or melena. Denies fever or chills. States imbalance was similar to, although not as bad as unsteadiness/imbalance that prompted recent admission. Denies change in speech or vision. No numbness/weakness.   The history is provided by the patient and medical records.  Weakness Associated symptoms: no abdominal pain, no chest pain, no dysuria, no fever, no headaches, no nausea, no shortness of breath and no vomiting       Past Medical History:  Diagnosis Date   Diabetes mellitus without complication ([place])    Hyperlipidemia    Hypertension     Patient Active Problem List   Diagnosis Date Noted   HTN (hypertension) 07/19/2020   Acute CVA (cerebrovascular accident) ([place]) 07/19/2020   Chronic diastolic congestive heart failure ([place]) 11/28/2019   Pyrexia    SIRS (systemic inflammatory response syndrome) ([place]) 10/26/2014   Tachypnea 10/26/2014   Hyperlipidemia 10/26/2014   Elevated transaminase level 10/26/2014   Sepsis ([place]) 10/25/2014   Leukocytosis    Diabetes mellitus type 2, uncontrolled, with complications ([place]) 59/74/1638   Hypokalemia 10/20/2014   Shortness of breath    Elevated troponin    Diabetes ([place]) 09/13/2012    History reviewed. No pertinent surgical history.   OB History   No obstetric history on file.     Family  History  Problem Relation Age of Onset   Heart block Mother    Asthma Other    Hypertension Other    Hyperlipidemia Other    Diabetes Other     Social History   Tobacco Use   Smoking status: Former    Pack years: 0.00   Smokeless tobacco: Never  Substance Use Topics   Alcohol use: No    Alcohol/week: 0.0 standard drinks   Drug use: No    Home Medications Prior to Admission medications   Medication Sig Start Date End Date Taking? Authorizing Provider  acetaminophen (TYLENOL) 500 MG tablet Take 500 mg by mouth every 6 (six) hours as needed for mild pain.   Yes [provider]  aspirin 81 MG tablet Take 1 tablet (81 mg total) by mouth daily for 21 days. 07/20/20 08/10/20  Mercy Riding, MD  Blood Glucose Monitoring Suppl (ACCU-CHEK GUIDE) w/Device KIT use for blood sugar check fingerstick three times a day DX E11.69 06/18/20     Blood Glucose Monitoring Suppl (FREESTYLE LITE) w/Device KIT Use as directed three times daily 06/30/20     clopidogrel (PLAVIX) 75 MG tablet Take 1 tablet (75 mg total) by mouth daily. 07/21/20   Mercy Riding, MD  dextromethorphan-guaiFENesin (MUCINEX DM) 30-600 MG 12hr tablet Take 1 tablet by mouth 2 (two) times daily as needed for cough.    [provider]  Dulaglutide 0.75 MG/0.5ML SOPN INJECT 1 PEN INTO THE SKIN ONCE A WEEK. Patient taking differently:  Inject 0.75 mg into the skin every Sunday. 03/06/20 03/06/21  Alroy Dust, L.Marlou Sa, MD  empagliflozin (JARDIANCE) 25 MG TABS tablet Take 1 tablet (25 mg total) by mouth daily. 07/01/20     glucose blood (ACCU-CHEK GUIDE) test strip Use 1 strip finger stick up to three times a day to check blood sugar DXE11.69 06/18/20     glucose blood (FREESTYLE LITE) test strip Use as directed up to 3 times daily 06/30/20     HYDROcodone-acetaminophen (NORCO/VICODIN) 5-325 MG tablet Take 1 tablet by mouth every 4 (four) hours as needed. Patient not taking: No sig reported 04/18/19   Larene Pickett, PA-C  Lancets  (FREESTYLE) lancets Use as directed up to 3 times daily 06/30/20     Lancets Misc. MISC Use as directed 3 times daily 06/18/20     lisinopril-hydrochlorothiazide (ZESTORETIC) 20-12.5 MG tablet TAKE 1 TABLET BY MOUTH ONCE DAILY. 05/29/19 08/12/20  Alroy Dust, L.Marlou Sa, MD  metFORMIN (GLUCOPHAGE) 500 MG tablet Take 2 tablets (1,000 mg total) by mouth 2 (two) times daily. 06/03/20     metoprolol tartrate (LOPRESSOR) 25 MG tablet TAKE 1/2 TABLET BY MOUTH TWO TIMES DAILY WITH FOOD Patient taking differently: Take 12.5 mg by mouth 2 (two) times daily with a meal. 05/31/19 07/19/20  Mitchell, L.Marlou Sa, MD  Multiple Vitamins-Calcium (ONE-A-DAY WOMENS PO) Take 1 tablet by mouth daily.    [provider]  rosuvastatin (CRESTOR) 10 MG tablet Take 1 tablet (10 mg total) by mouth daily. 06/30/20     sitaGLIPtin (JANUVIA) 100 MG tablet Take 1 tablet (100 mg total) by mouth daily. 07/01/20     triamcinolone cream (KENALOG) 0.1 % Apply 1 application topically 2 (two) times daily as needed (for itching). 04/14/20   [provider]    Allergies    Pioglitazone, Canagliflozin, and Statins  Review of Systems   Review of Systems  Constitutional:  Negative for chills and fever.  HENT:  Negative for trouble swallowing.   Eyes:  Negative for visual disturbance.  Respiratory:  Negative for shortness of breath.   Cardiovascular:  Negative for chest pain, palpitations and leg swelling.  Gastrointestinal:  Negative for abdominal pain, blood in stool, nausea and vomiting.  Genitourinary:  Negative for dysuria and flank pain.  Musculoskeletal:  Negative for back pain and neck pain.  Skin:  Negative for rash.  Neurological:  Positive for light-headedness. Negative for speech difficulty and headaches.  Hematological:  Does not bruise/bleed easily.  Psychiatric/Behavioral:  Negative for confusion.    Physical Exam Updated Vital Signs BP 120/81   Pulse 80   Temp 98.6 F (37 C) (Oral)   Resp (!) 21   SpO2 98%    Physical Exam Vitals and nursing note reviewed.  Constitutional:      Appearance: Normal appearance. She is well-developed.  HENT:     Head: Atraumatic.     Nose: Nose normal.     Mouth/Throat:     Mouth: Mucous membranes are moist.  Eyes:     General: No scleral icterus.    Extraocular Movements: Extraocular movements intact.     Conjunctiva/sclera: Conjunctivae normal.     Pupils: Pupils are equal, round, and reactive to light.     Comments: Left conjunctiva injected. No purulent drainage. No pain w eom. Lids everted, no fb seen. No orbital or periorbital cellulitis. No matting. No corneal clouding.    Neck:     Vascular: No carotid bruit.     Trachea: No tracheal deviation.  Cardiovascular:  Rate and Rhythm: Normal rate and regular rhythm.     Pulses: Normal pulses.     Heart sounds: Normal heart sounds. No murmur heard.   No friction rub. No gallop.  Pulmonary:     Effort: Pulmonary effort is normal. No respiratory distress.     Breath sounds: Normal breath sounds.  Abdominal:     General: Bowel sounds are normal. There is no distension.     Palpations: Abdomen is soft.     Tenderness: There is no abdominal tenderness. There is no guarding.  Genitourinary:    Comments: No cva tenderness.  Musculoskeletal:        General: No swelling or tenderness.     Cervical back: Normal range of motion and neck supple. No rigidity. No muscular tenderness.  Skin:    General: Skin is warm and dry.     Findings: No rash.  Neurological:     Mental Status: She is alert.     Comments: Alert, speech normal. No dysarthria or aphasia. Motor intact bil, stre 5/5. No pronator drift. Sens grossly intact. Gait is steady, no ataxia.  Psychiatric:        Mood and Affect: Mood normal.    ED Results / Procedures / Treatments   Labs (all labs ordered are listed, but only abnormal results are displayed) Results for orders placed or performed during the hospital encounter of 07/21/20  CBC  with Differential  Result Value Ref Range   WBC 6.2 4.0 - 10.5 K/uL   RBC 5.61 (H) 3.87 - 5.11 MIL/uL   Hemoglobin 13.6 12.0 - 15.0 g/dL   HCT 45.2 36.0 - 46.0 %   MCV 80.6 80.0 - 100.0 fL   MCH 24.2 (L) 26.0 - 34.0 pg   MCHC 30.1 30.0 - 36.0 g/dL   RDW 14.9 11.5 - 15.5 %   Platelets 251 150 - 400 K/uL   nRBC 0.0 0.0 - 0.2 %   Neutrophils Relative % 56 %   Neutro Abs 3.5 1.7 - 7.7 K/uL   Lymphocytes Relative 30 %   Lymphs Abs 1.8 0.7 - 4.0 K/uL   Monocytes Relative 9 %   Monocytes Absolute 0.6 0.1 - 1.0 K/uL   Eosinophils Relative 4 %   Eosinophils Absolute 0.3 0.0 - 0.5 K/uL   Basophils Relative 1 %   Basophils Absolute 0.1 0.0 - 0.1 K/uL   Immature Granulocytes 0 %   Abs Immature Granulocytes 0.02 0.00 - 0.07 K/uL  Basic metabolic panel  Result Value Ref Range   Sodium 141 135 - 145 mmol/L   Potassium 4.2 3.5 - 5.1 mmol/L   Chloride 103 98 - 111 mmol/L   CO2 28 22 - 32 mmol/L   Glucose, Bld 142 (H) 70 - 99 mg/dL   BUN 13 8 - 23 mg/dL   Creatinine, Ser 0.77 0.44 - 1.00 mg/dL   Calcium 9.3 8.9 - 10.3 mg/dL   GFR, Estimated >60 >60 mL/min   Anion gap 10 5 - 15  Protime-INR  Result Value Ref Range   Prothrombin Time 12.7 11.4 - 15.2 seconds   INR 1.0 0.8 - 1.2   CT ANGIO HEAD NECK W WO CM  Result Date: 07/19/2020 CLINICAL DATA:  Vertigo. EXAM: CT ANGIOGRAPHY HEAD AND NECK TECHNIQUE: Multidetector CT imaging of the head and neck was performed using the standard protocol during bolus administration of intravenous contrast. Multiplanar CT image reconstructions and MIPs were obtained to evaluate the vascular anatomy. Carotid stenosis measurements (when  applicable) are obtained utilizing NASCET criteria, using the distal internal carotid diameter as the denominator. CONTRAST:  171m OMNIPAQUE IOHEXOL 350 MG/ML SOLN COMPARISON:  None. FINDINGS: CTA NECK FINDINGS Aortic arch: Calcific atherosclerosis. Great vessel origins are patent. Right carotid system: Motion limited evaluation  proximally without visible significant (greater than 50%) stenosis. Mild atherosclerosis at the carotid bifurcation. Left carotid system: Mild atherosclerosis at the carotid bifurcation without significant (greater than 50%) stenosis. Vertebral arteries: Mildly right dominant. Bilateral vertebral arteries are patent without evidence of greater than 50% stenosis. Skeleton: Moderate degenerative disc disease on the left at C5-C6 with disc height loss and endplate spurring. Other neck: No acute findings. Upper chest: Visualized lung apices are clear. Review of the MIP images confirms the above findings CTA HEAD FINDINGS Anterior circulation: Bilateral intracranial ICAs, MCAs, and ACAs are patent without proximal flow limiting stenosis. Small (1 mm) inferiorly directed outpouching arising from the left supraclinoid ICA (see series 9, image 90) Posterior circulation: Mild atherosclerosis of bilateral intradural vertebral arteries. Bilateral intradural vertebral arteries, basilar artery, and posterior cerebral arteries are patent without proximal flow limiting stenosis. Small right P1 PCA with posterior communicating artery, anatomic variant. No aneurysm identified. Venous sinuses: As permitted by contrast timing, patent. Anatomic variants: Detailed above. Review of the MIP images confirms the above findings IMPRESSION: 1. No large vessel occlusion or proximal flow limiting stenosis in the head or neck. 2. Small (1 mm) inferiorly directed outpouching arising from the left supraclinoid ICA, which may represent a small aneurysm versus an infundibulum with vessel not visible by CTA. Electronically Signed   By: FMargaretha SheffieldMD   On: 07/19/2020 13:43   CT HEAD WO CONTRAST  Result Date: 07/19/2020 CLINICAL DATA:  Dizziness and off balance.  Right arm tingling. EXAM: CT HEAD WITHOUT CONTRAST TECHNIQUE: Contiguous axial images were obtained from the base of the skull through the vertex without intravenous contrast.  COMPARISON:  None. FINDINGS: Brain: Ventricles, cisterns and other CSF spaces are normal. No mass, mass effect, shift of midline structures or acute hemorrhage. No evidence of acute infarction. Vascular: No hyperdense vessel or unexpected calcification. Skull: Normal. Negative for fracture or focal lesion. Sinuses/Orbits: No acute finding. Other: None. IMPRESSION: No acute findings. Electronically Signed   By: DMarin OlpM.D.   On: 07/19/2020 11:24   MR BRAIN WO CONTRAST  Result Date: 07/19/2020 CLINICAL DATA:  Small left mastoid effusion. EXAM: MRI HEAD WITHOUT CONTRAST TECHNIQUE: Multiplanar, multiecho pulse sequences of the brain and surrounding structures were obtained without intravenous contrast. COMPARISON:  CT head from the same day. FINDINGS: Brain: Small acute infarcts in the dorsal left paramidline pons (series 5, images 62 through 64). Very faint edema without mass effect. Remote small left cerebellar, left thalamic, and bilateral basal ganglia lacunar infarcts. Additional moderate scattered T2/FLAIR hyperintensities within the white matter, which are nonspecific but potentially related to chronic microvascular ischemic disease. No evidence of hydrocephalus, acute hemorrhage, extra-axial fluid collection, mass lesion, or midline shift. Vascular: Further evaluated on same day CTA. Skull and upper cervical spine: Normal marrow signal. Sinuses/Orbits: Clear sinuses.  Unremarkable orbits. Other: Small left mastoid effusion. IMPRESSION: 1. Small acute infarcts in the dorsal left paramidline pons. Very faint edema without mass effect. 2. Remote small left cerebellar, left thalamic, and bilateral basal ganglia lacunar infarcts. 3. Moderate chronic microvascular ischemic disease. Electronically Signed   By: FMargaretha SheffieldMD   On: 07/19/2020 13:27   ECHOCARDIOGRAM COMPLETE  Result Date: 07/19/2020    ECHOCARDIOGRAM REPORT  Patient Name:   Brittany [name] Date of Exam: 07/19/2020 Medical Rec #:   [id number]      Height:       63.0 in Accession #:    [id number]     Weight:       203.4 lb Date of Birth:  1955/02/10      BSA:          1.948 m Patient Age:    50 years       BP:           197/110 mmHg Patient Gender: F              HR:           83 bpm. Exam Location:  Inpatient Procedure: 2D Echo, Cardiac Doppler and Color Doppler Indications:    Stroke I63.9  History:        Patient has prior history of Echocardiogram examinations, most                 recent 10/21/2014. Risk Factors:Hypertension and Obesity.  Sonographer:    Merrie Roof RDCS Referring Phys: [id number] [place]  1. Left ventricular ejection fraction, by estimation, is 55 to 60%. The left ventricle has normal function. The left ventricle has no regional wall motion abnormalities. Left ventricular diastolic parameters are consistent with Grade I diastolic dysfunction (impaired relaxation).  2. Right ventricular systolic function is normal. The right ventricular size is normal.  3. The mitral valve is normal in structure. No evidence of mitral valve regurgitation. No evidence of mitral stenosis.  4. The aortic valve is tricuspid. Aortic valve regurgitation is not visualized. Mild aortic valve sclerosis is present, with no evidence of aortic valve stenosis.  5. The inferior vena cava is normal in size with greater than 50% respiratory variability, suggesting right atrial pressure of 3 mmHg. FINDINGS  Left Ventricle: Left ventricular ejection fraction, by estimation, is 55 to 60%. The left ventricle has normal function. The left ventricle has no regional wall motion abnormalities. The left ventricular internal cavity size was normal in size. There is  no left ventricular hypertrophy. Left ventricular diastolic parameters are consistent with Grade I diastolic dysfunction (impaired relaxation). Normal left ventricular filling pressure. Right Ventricle: The right ventricular size is normal. No increase in right ventricular wall thickness.  Right ventricular systolic function is normal. Left Atrium: Left atrial size was normal in size. Right Atrium: Right atrial size was normal in size. Pericardium: There is no evidence of pericardial effusion. Mitral Valve: The mitral valve is normal in structure. Mild to moderate mitral annular calcification. No evidence of mitral valve regurgitation. No evidence of mitral valve stenosis. Tricuspid Valve: The tricuspid valve is normal in structure. Tricuspid valve regurgitation is not demonstrated. No evidence of tricuspid stenosis. Aortic Valve: The aortic valve is tricuspid. Aortic valve regurgitation is not visualized. Mild aortic valve sclerosis is present, with no evidence of aortic valve stenosis. Aortic valve mean gradient measures 3.0 mmHg. Aortic valve peak gradient measures 6.2 mmHg. Aortic valve area, by VTI measures 2.33 cm. Pulmonic Valve: The pulmonic valve was normal in structure. Pulmonic valve regurgitation is not visualized. No evidence of pulmonic stenosis. Aorta: The aortic root is normal in size and structure. Venous: The inferior vena cava is normal in size with greater than 50% respiratory variability, suggesting right atrial pressure of 3 mmHg. IAS/Shunts: No atrial level shunt detected by color flow Doppler.  LEFT VENTRICLE PLAX 2D LVIDd:  3.40 cm  Diastology LVIDs:         2.50 cm  LV e' medial:    6.53 cm/s LV PW:         1.20 cm  LV E/e' medial:  9.6 LV IVS:        1.10 cm  LV e' lateral:   8.92 cm/s LVOT diam:     1.90 cm  LV E/e' lateral: 7.0 LV SV:         49 LV SV Index:   25 LVOT Area:     2.84 cm  RIGHT VENTRICLE RV Basal diam:  2.70 cm LEFT ATRIUM             Index       RIGHT ATRIUM           Index LA diam:        3.40 cm 1.75 cm/m  RA Area:     10.50 cm LA Vol (A2C):   61.1 ml 31.37 ml/m RA Volume:   19.50 ml  10.01 ml/m LA Vol (A4C):   35.3 ml 18.13 ml/m LA Biplane Vol: 48.9 ml 25.11 ml/m  AORTIC VALVE AV Area (Vmax):    2.31 cm AV Area (Vmean):   2.17 cm AV Area  (VTI):     2.33 cm AV Vmax:           124.00 cm/s AV Vmean:          82.800 cm/s AV VTI:            0.212 m AV Peak Grad:      6.2 mmHg AV Mean Grad:      3.0 mmHg LVOT Vmax:         101.00 cm/s LVOT Vmean:        63.400 cm/s LVOT VTI:          0.174 m LVOT/AV VTI ratio: 0.82  AORTA Ao Root diam: 2.70 cm MITRAL VALVE MV Area (PHT): 4.80 cm    SHUNTS MV Decel Time: 158 msec    Systemic VTI:  0.17 m MV E velocity: 62.40 cm/s  Systemic Diam: 1.90 cm MV A velocity: 93.30 cm/s MV E/A ratio:  0.67 Mihai Croitoru MD Electronically signed by Sanda Klein MD Signature Date/Time: 07/19/2020/4:04:13 PM    Final     EKG None  Radiology CT ANGIO HEAD NECK W WO CM  Result Date: 07/19/2020 CLINICAL DATA:  Vertigo. EXAM: CT ANGIOGRAPHY HEAD AND NECK TECHNIQUE: Multidetector CT imaging of the head and neck was performed using the standard protocol during bolus administration of intravenous contrast. Multiplanar CT image reconstructions and MIPs were obtained to evaluate the vascular anatomy. Carotid stenosis measurements (when applicable) are obtained utilizing NASCET criteria, using the distal internal carotid diameter as the denominator. CONTRAST:  19m OMNIPAQUE IOHEXOL 350 MG/ML SOLN COMPARISON:  None. FINDINGS: CTA NECK FINDINGS Aortic arch: Calcific atherosclerosis. Great vessel origins are patent. Right carotid system: Motion limited evaluation proximally without visible significant (greater than 50%) stenosis. Mild atherosclerosis at the carotid bifurcation. Left carotid system: Mild atherosclerosis at the carotid bifurcation without significant (greater than 50%) stenosis. Vertebral arteries: Mildly right dominant. Bilateral vertebral arteries are patent without evidence of greater than 50% stenosis. Skeleton: Moderate degenerative disc disease on the left at C5-C6 with disc height loss and endplate spurring. Other neck: No acute findings. Upper chest: Visualized lung apices are clear. Review of the MIP images  confirms the above findings CTA HEAD FINDINGS Anterior circulation: Bilateral intracranial  ICAs, MCAs, and ACAs are patent without proximal flow limiting stenosis. Small (1 mm) inferiorly directed outpouching arising from the left supraclinoid ICA (see series 9, image 90) Posterior circulation: Mild atherosclerosis of bilateral intradural vertebral arteries. Bilateral intradural vertebral arteries, basilar artery, and posterior cerebral arteries are patent without proximal flow limiting stenosis. Small right P1 PCA with posterior communicating artery, anatomic variant. No aneurysm identified. Venous sinuses: As permitted by contrast timing, patent. Anatomic variants: Detailed above. Review of the MIP images confirms the above findings IMPRESSION: 1. No large vessel occlusion or proximal flow limiting stenosis in the head or neck. 2. Small (1 mm) inferiorly directed outpouching arising from the left supraclinoid ICA, which may represent a small aneurysm versus an infundibulum with vessel not visible by CTA. Electronically Signed   By: Margaretha Sheffield MD   On: 07/19/2020 13:43   CT HEAD WO CONTRAST  Result Date: 07/19/2020 CLINICAL DATA:  Dizziness and off balance.  Right arm tingling. EXAM: CT HEAD WITHOUT CONTRAST TECHNIQUE: Contiguous axial images were obtained from the base of the skull through the vertex without intravenous contrast. COMPARISON:  None. FINDINGS: Brain: Ventricles, cisterns and other CSF spaces are normal. No mass, mass effect, shift of midline structures or acute hemorrhage. No evidence of acute infarction. Vascular: No hyperdense vessel or unexpected calcification. Skull: Normal. Negative for fracture or focal lesion. Sinuses/Orbits: No acute finding. Other: None. IMPRESSION: No acute findings. Electronically Signed   By: Marin Olp M.D.   On: 07/19/2020 11:24   MR BRAIN WO CONTRAST  Result Date: 07/19/2020 CLINICAL DATA:  Small left mastoid effusion. EXAM: MRI HEAD WITHOUT CONTRAST  TECHNIQUE: Multiplanar, multiecho pulse sequences of the brain and surrounding structures were obtained without intravenous contrast. COMPARISON:  CT head from the same day. FINDINGS: Brain: Small acute infarcts in the dorsal left paramidline pons (series 5, images 62 through 64). Very faint edema without mass effect. Remote small left cerebellar, left thalamic, and bilateral basal ganglia lacunar infarcts. Additional moderate scattered T2/FLAIR hyperintensities within the white matter, which are nonspecific but potentially related to chronic microvascular ischemic disease. No evidence of hydrocephalus, acute hemorrhage, extra-axial fluid collection, mass lesion, or midline shift. Vascular: Further evaluated on same day CTA. Skull and upper cervical spine: Normal marrow signal. Sinuses/Orbits: Clear sinuses.  Unremarkable orbits. Other: Small left mastoid effusion. IMPRESSION: 1. Small acute infarcts in the dorsal left paramidline pons. Very faint edema without mass effect. 2. Remote small left cerebellar, left thalamic, and bilateral basal ganglia lacunar infarcts. 3. Moderate chronic microvascular ischemic disease. Electronically Signed   By: Margaretha Sheffield MD   On: 07/19/2020 13:27   ECHOCARDIOGRAM COMPLETE  Result Date: 07/19/2020    ECHOCARDIOGRAM REPORT   Patient Name:   Brittany [name] Date of Exam: 07/19/2020 Medical Rec #:  [id number]      Height:       63.0 in Accession #:    [id number]     Weight:       203.4 lb Date of Birth:  04-25-1955      BSA:          1.948 m Patient Age:    3 years       BP:           197/110 mmHg Patient Gender: F              HR:           83 bpm. Exam Location:  Inpatient Procedure: 2D Echo, Cardiac Doppler  and Color Doppler Indications:    Stroke I63.9  History:        Patient has prior history of Echocardiogram examinations, most                 recent 10/21/2014. Risk Factors:Hypertension and Obesity.  Sonographer:    Merrie Roof RDCS Referring Phys: [id number] [place]  1. Left ventricular ejection fraction, by estimation, is 55 to 60%. The left ventricle has normal function. The left ventricle has no regional wall motion abnormalities. Left ventricular diastolic parameters are consistent with Grade I diastolic dysfunction (impaired relaxation).  2. Right ventricular systolic function is normal. The right ventricular size is normal.  3. The mitral valve is normal in structure. No evidence of mitral valve regurgitation. No evidence of mitral stenosis.  4. The aortic valve is tricuspid. Aortic valve regurgitation is not visualized. Mild aortic valve sclerosis is present, with no evidence of aortic valve stenosis.  5. The inferior vena cava is normal in size with greater than 50% respiratory variability, suggesting right atrial pressure of 3 mmHg. FINDINGS  Left Ventricle: Left ventricular ejection fraction, by estimation, is 55 to 60%. The left ventricle has normal function. The left ventricle has no regional wall motion abnormalities. The left ventricular internal cavity size was normal in size. There is  no left ventricular hypertrophy. Left ventricular diastolic parameters are consistent with Grade I diastolic dysfunction (impaired relaxation). Normal left ventricular filling pressure. Right Ventricle: The right ventricular size is normal. No increase in right ventricular wall thickness. Right ventricular systolic function is normal. Left Atrium: Left atrial size was normal in size. Right Atrium: Right atrial size was normal in size. Pericardium: There is no evidence of pericardial effusion. Mitral Valve: The mitral valve is normal in structure. Mild to moderate mitral annular calcification. No evidence of mitral valve regurgitation. No evidence of mitral valve stenosis. Tricuspid Valve: The tricuspid valve is normal in structure. Tricuspid valve regurgitation is not demonstrated. No evidence of tricuspid stenosis. Aortic Valve: The aortic valve is tricuspid.  Aortic valve regurgitation is not visualized. Mild aortic valve sclerosis is present, with no evidence of aortic valve stenosis. Aortic valve mean gradient measures 3.0 mmHg. Aortic valve peak gradient measures 6.2 mmHg. Aortic valve area, by VTI measures 2.33 cm. Pulmonic Valve: The pulmonic valve was normal in structure. Pulmonic valve regurgitation is not visualized. No evidence of pulmonic stenosis. Aorta: The aortic root is normal in size and structure. Venous: The inferior vena cava is normal in size with greater than 50% respiratory variability, suggesting right atrial pressure of 3 mmHg. IAS/Shunts: No atrial level shunt detected by color flow Doppler.  LEFT VENTRICLE PLAX 2D LVIDd:         3.40 cm  Diastology LVIDs:         2.50 cm  LV e' medial:    6.53 cm/s LV PW:         1.20 cm  LV E/e' medial:  9.6 LV IVS:        1.10 cm  LV e' lateral:   8.92 cm/s LVOT diam:     1.90 cm  LV E/e' lateral: 7.0 LV SV:         49 LV SV Index:   25 LVOT Area:     2.84 cm  RIGHT VENTRICLE RV Basal diam:  2.70 cm LEFT ATRIUM             Index       RIGHT ATRIUM  Index LA diam:        3.40 cm 1.75 cm/m  RA Area:     10.50 cm LA Vol (A2C):   61.1 ml 31.37 ml/m RA Volume:   19.50 ml  10.01 ml/m LA Vol (A4C):   35.3 ml 18.13 ml/m LA Biplane Vol: 48.9 ml 25.11 ml/m  AORTIC VALVE AV Area (Vmax):    2.31 cm AV Area (Vmean):   2.17 cm AV Area (VTI):     2.33 cm AV Vmax:           124.00 cm/s AV Vmean:          82.800 cm/s AV VTI:            0.212 m AV Peak Grad:      6.2 mmHg AV Mean Grad:      3.0 mmHg LVOT Vmax:         101.00 cm/s LVOT Vmean:        63.400 cm/s LVOT VTI:          0.174 m LVOT/AV VTI ratio: 0.82  AORTA Ao Root diam: 2.70 cm MITRAL VALVE MV Area (PHT): 4.80 cm    SHUNTS MV Decel Time: 158 msec    Systemic VTI:  0.17 m MV E velocity: 62.40 cm/s  Systemic Diam: 1.90 cm MV A velocity: 93.30 cm/s MV E/A ratio:  0.67 Mihai Croitoru MD Electronically signed by Sanda Klein MD Signature Date/Time:  07/19/2020/4:04:13 PM    Final     Procedures Procedures   Medications Ordered in ED Medications - No data to display  ED Course  I have reviewed the triage vital signs and the nursing notes.  Pertinent labs & imaging results that were available during my care of the patient were reviewed by me and considered in my medical decision making (see chart for details).    MDM Rules/Calculators/A&P                         Iv ns. Continuous pulse ox and cardiac monitoring. Ecg. Labs.   Reviewed nursing notes and prior charts for additional history. Recent admission reviewed - small cva noted on recent MRI.   Currently ambulatory about room without apparent ataxia or coordination abnormality. No faintness or dizziness.   In ED noted with mild left eye erythema, pt notes mild soreness/mild tearing, no purulent drainage.   Neurology consulted re dizziness, feeling of unsteadiness - discussed pt, recent admission, meds, etc - neurology states get mri, if no acute change/new strokes, d/c to home on current meds/therapy. If new strokes, admit to medicine for TEE, search for embolic source.   MRI is pending.   Tetracaine drops left eye, tonopen used to check pressures, reads of 14 and 16 obtained. Exam is most c/w conjunctivitis. Pt does not wear contacts. No fb noted. No orbital or periorbital cellulitis. Tobrex gtts. Pt/fam indicates her eye doctor is Dr Katy Fitch - f/u there in next 1-2 days if symptoms fail to improve/resolve (sooner if worse).  Labs reviewed/interpreted by me - chem normal.   CT reviewed/interpreted by me - no hem.   MRI remains pending.    MRI shows recent cva - no additional/new cva since then.   Pt remains ambulatory about ED w steady gait. No ataxia or dizziness.   Pt currently appears stable for d/c.       Final Clinical Impression(s) / ED Diagnoses Final diagnoses:  None    Rx / DC  Orders ED Discharge Orders     None           Lajean Saver,  MD 07/21/20 [phone]

## 2020-07-21 NOTE — Discharge Instructions (Addendum)
It was our pleasure to provide your ER care today - we hope that you feel better.  Your MRI shows your recent stroke, but no additional/new stroke since then. Continue current meds including aspirin and plavix.  Follow up with neurologist in the next couple weeks.   Use tobrex eye drops 2 drops left eye 4-5x/day for the next five days.   Follow up with your eye doctor in the next 1-2 days if eye symptoms fail to improve/resolve.  Return to ER if worse, new symptoms, severe dizziness or problems with balance/gait, one-side of body numbness/weakness, change in vision or speech, severe eye pain or spreading redness/increased swelling, severe headache, or other concern.

## 2020-07-21 NOTE — ED Notes (Signed)
Pt ambulated to the bathroom on her own power, gait steady and even. Pt resting back in bed at this time, pt offered water for PO challenge

## 2020-07-23 ENCOUNTER — Other Ambulatory Visit (HOSPITAL_COMMUNITY): Payer: Self-pay

## 2020-07-23 MED ORDER — PREDNISOLONE ACETATE 1 % OP SUSP
1.0000 [drp] | Freq: Four times a day (QID) | OPHTHALMIC | 0 refills | Status: AC
  Filled 2020-07-23: qty 5, 25d supply, fill #0

## 2020-07-24 ENCOUNTER — Other Ambulatory Visit (HOSPITAL_COMMUNITY): Payer: Self-pay

## 2020-07-24 ENCOUNTER — Telehealth (HOSPITAL_COMMUNITY): Payer: Self-pay | Admitting: Pharmacist

## 2020-07-24 NOTE — Telephone Encounter (Signed)
Pharmacy Transitions of Care Follow-up Telephone Call  Date of discharge: 07/20/20  Discharge Diagnosis: CVA  How have you been since you were released from the hospital? Doing alright  Medication changes made at discharge:  - START: Plavix  Medication changes verified by the patient? Y    Medication Accessibility:  Home Pharmacy: [facility]   Was the patient provided with refills on discharged medications? y   Have all prescriptions been transferred from [facility] to home pharmacy? y   Is the patient able to afford medications? Y Notable copays: zero copay    Medication Review:   CLOPIDOGREL (PLAVIX) Clopidogrel 75 mg once daily.  Plan is for DAPT with low dose ASA and Plavix x 3 weeks followed by Plavix - Reviewed potential DDIs with patient  - Advised patient of medications to avoid (NSAIDs, ASA)  - Educated that Tylenol (acetaminophen) will be the preferred analgesic to prevent risk of bleeding  - Emphasized importance of monitoring for signs and symptoms of bleeding (abnormal bruising, prolonged bleeding, nose bleeds, bleeding from gums, discolored urine, black tarry stools)  - Advised patient to alert all providers of anticoagulation therapy prior to starting a new medication or having a procedure    Follow-up Appointments:  [facility] f/u appt confirmed?  Scheduled to see Gae Gallop on 8/16 @ 0915.   If their condition worsens, is the pt aware to call PCP or go to the Emergency Dept.?   Final Patient Assessment:  -Pt is doing fine.  -Pt verbalized understanding of Plavix.  -Pt has post discharge appointment and refill sent to [facility]   -Per pt, she is to call he primary MD to set up an appointment.  Dorethea Clan, PharmD

## 2020-07-26 MED FILL — Dulaglutide Soln Auto-injector 0.75 MG/0.5ML: SUBCUTANEOUS | 28 days supply | Qty: 2 | Fill #1 | Status: AC

## 2020-07-27 ENCOUNTER — Other Ambulatory Visit (HOSPITAL_COMMUNITY): Payer: Self-pay

## 2020-07-29 ENCOUNTER — Other Ambulatory Visit (HOSPITAL_COMMUNITY): Payer: Self-pay

## 2020-07-31 ENCOUNTER — Other Ambulatory Visit (HOSPITAL_COMMUNITY): Payer: Self-pay

## 2020-07-31 MED ORDER — METFORMIN HCL 1000 MG PO TABS
1000.0000 mg | ORAL_TABLET | Freq: Two times a day (BID) | ORAL | 3 refills | Status: DC
  Filled 2020-07-31: qty 164, 82d supply, fill #0
  Filled 2020-07-31: qty 16, 8d supply, fill #0
  Filled 2020-11-01: qty 180, 90d supply, fill #1
  Filled 2021-04-05: qty 180, 90d supply, fill #2
  Filled 2021-07-10: qty 180, 90d supply, fill #3

## 2020-08-03 ENCOUNTER — Other Ambulatory Visit (HOSPITAL_COMMUNITY): Payer: Self-pay

## 2020-08-04 ENCOUNTER — Other Ambulatory Visit (HOSPITAL_COMMUNITY): Payer: Self-pay

## 2020-08-11 ENCOUNTER — Other Ambulatory Visit (HOSPITAL_COMMUNITY): Payer: Self-pay

## 2020-08-12 ENCOUNTER — Other Ambulatory Visit (HOSPITAL_COMMUNITY): Payer: Self-pay

## 2020-08-13 ENCOUNTER — Other Ambulatory Visit (HOSPITAL_COMMUNITY): Payer: Self-pay

## 2020-08-13 MED ORDER — LISINOPRIL-HYDROCHLOROTHIAZIDE 20-12.5 MG PO TABS
1.0000 | ORAL_TABLET | Freq: Every day | ORAL | 3 refills | Status: DC
  Filled 2020-08-13: qty 90, 90d supply, fill #0
  Filled 2020-11-16: qty 90, 90d supply, fill #1
  Filled 2021-02-22: qty 90, 90d supply, fill #2
  Filled 2021-05-15: qty 90, 90d supply, fill #3

## 2020-08-13 MED ORDER — METOPROLOL TARTRATE 25 MG PO TABS
12.5000 mg | ORAL_TABLET | Freq: Two times a day (BID) | ORAL | 3 refills | Status: DC
  Filled 2020-08-13: qty 90, 90d supply, fill #0
  Filled 2021-01-28: qty 90, 90d supply, fill #1

## 2020-08-17 ENCOUNTER — Other Ambulatory Visit (HOSPITAL_COMMUNITY): Payer: Self-pay

## 2020-08-17 MED ORDER — ESTRADIOL 0.1 MG/GM VA CREA
TOPICAL_CREAM | Freq: Every evening | VAGINAL | 6 refills | Status: AC
  Filled 2020-08-17: qty 42.5, 30d supply, fill #0
  Filled 2021-04-01: qty 42.5, 30d supply, fill #1
  Filled 2021-06-29: qty 42.5, 30d supply, fill #2

## 2020-08-25 ENCOUNTER — Other Ambulatory Visit (HOSPITAL_COMMUNITY): Payer: Self-pay

## 2020-08-25 ENCOUNTER — Encounter: Payer: Self-pay | Admitting: Adult Health

## 2020-08-25 ENCOUNTER — Other Ambulatory Visit: Payer: Self-pay

## 2020-08-25 ENCOUNTER — Ambulatory Visit: Payer: HMO | Admitting: Adult Health

## 2020-08-25 VITALS — BP 126/77 | HR 76 | Ht 63.0 in | Wt 174.0 lb

## 2020-08-25 DIAGNOSIS — E119 Type 2 diabetes mellitus without complications: Secondary | ICD-10-CM

## 2020-08-25 DIAGNOSIS — I639 Cerebral infarction, unspecified: Secondary | ICD-10-CM | POA: Diagnosis not present

## 2020-08-25 DIAGNOSIS — Z8673 Personal history of transient ischemic attack (TIA), and cerebral infarction without residual deficits: Secondary | ICD-10-CM

## 2020-08-25 DIAGNOSIS — E785 Hyperlipidemia, unspecified: Secondary | ICD-10-CM | POA: Diagnosis not present

## 2020-08-25 DIAGNOSIS — I10 Essential (primary) hypertension: Secondary | ICD-10-CM | POA: Diagnosis not present

## 2020-08-25 MED FILL — Dulaglutide Soln Auto-injector 0.75 MG/0.5ML: SUBCUTANEOUS | 28 days supply | Qty: 2 | Fill #2 | Status: AC

## 2020-08-25 NOTE — Patient Instructions (Signed)
Overall recovering well - do hand exercises as provided for likely further recovery   Continue clopidogrel 75 mg daily  and Crestor 10mg  daily  for secondary stroke prevention  Continue to follow up with PCP regarding cholesterol, blood pressure and diabetes management  Maintain strict control of hypertension with blood pressure goal below 130/90, diabetes with hemoglobin A1c goal below 7% and cholesterol with LDL cholesterol (bad cholesterol) goal below 70 mg/dL.       Followup in the future with me in 4 months or call earlier if needed       Thank you for coming to see at [facility]. I hope we have been able to provide you high quality care today.  You may receive a patient satisfaction survey over the next few weeks. We would appreciate your feedback and comments so that we may continue to improve ourselves and the health of our patients.

## 2020-08-25 NOTE — Progress Notes (Signed)
[facility] [address] [place]. [place] [address] [phone]       [place] TERITA HEJL Date of Birth:  04/10/1955 Medical Record Number:  [id number]   Reason for Referral:  hospital stroke follow up    SUBJECTIVE:   CHIEF COMPLAINT:  Chief Complaint  Patient presents with   Follow-up    RM 3 alone Pt is well, has some tingling in R hand but overall well.     HPI:   Ms. Brittany [name] is a 65 y.o. female with history of hypertension, hyperlipidemia, diabetes, CHF admitted on 07/19/2020 for lightheadedness and imbalance. No tPA given due to outside window.  Personally reviewed hospitalization pertinent progress notes, lab work and imaging.  Evaluated by Dr. Erlinda Hong for left pontine infarct likely secondary to small vessel disease. MRI also showed old left cerebellum, left thalamic and bilateral BG lacunar infarcts.  CTA head/neck unremarkable.  EF 55 to 60%.  LDL 62.  A1c 6.8.  On aspirin PTA -recommended DAPT for 3 weeks and Plavix alone as well as continuation of Crestor 10 mg daily.  Other stroke risk factors include HTN, CHF, advanced age and prior strokes on imaging.  PT/OT no recommendations  Today, 08/25/2020, Brittany [name] is being seen for hospital follow-up unaccompanied.  Returned to ED 07/21/2020 for dizziness and feeling of unsteadiness with MR brain negative for new stroke and lab work unremarkable.  She has since been doing well without residual imbalance, dizziness or unsteadiness. Initially had issues with right hand weakness and occasional numbness/tingling more noticeable with writing but has since greatly improved with only intermittent tingling sensation.  She plans on returning back to work this week at [employer].  Denies new stroke/TIA symptoms.  Completed 3 weeks DAPT and remains on Plavix as well as Crestor without side effects.  Blood pressure today 126/77. Glucose levels 120s-130s.  No further concerns at  this time.     ROS:   14 system review of systems performed and negative with exception of those listed in HPI  PMH:  Past Medical History:  Diagnosis Date   Diabetes mellitus without complication ([place])    Hyperlipidemia    Hypertension     PSH: History reviewed. No pertinent surgical history.  Social History:  Social History   Socioeconomic History   Marital status: Married    Spouse name: Not on file   Number of children: Not on file   Years of education: Not on file   Highest education level: Not on file  Occupational History   Not on file  Tobacco Use   Smoking status: Former   Smokeless tobacco: Never  Substance and Sexual Activity   Alcohol use: No    Alcohol/week: 0.0 standard drinks   Drug use: No   Sexual activity: Yes  Other Topics Concern   Not on file  Social History Narrative   Not on file   Social Determinants of Health   Financial Resource Strain: Not on file  Food Insecurity: Not on file  Transportation Needs: Not on file  Physical Activity: Not on file  Stress: Not on file  Social Connections: Not on file  Intimate Partner Violence: Not on file    Family History:  Family History  Problem Relation Age of Onset   Heart block Mother    Asthma Other    Hypertension Other    Hyperlipidemia Other    Diabetes Other  Medications:   Current Outpatient Medications on File Prior to Visit  Medication Sig Dispense Refill   acetaminophen (TYLENOL) 500 MG tablet Take 500 mg by mouth every 6 (six) hours as needed for mild pain.     Blood Glucose Monitoring Suppl (ACCU-CHEK GUIDE) w/Device KIT use for blood sugar check fingerstick three times a day DX E11.69 1 kit 0   Blood Glucose Monitoring Suppl (FREESTYLE LITE) w/Device KIT Use as directed three times daily 1 kit 0   clopidogrel (PLAVIX) 75 MG tablet Take 1 tablet (75 mg total) by mouth daily. 90 tablet 1   dextromethorphan-guaiFENesin (MUCINEX DM) 30-600 MG 12hr tablet Take 1 tablet by  mouth 2 (two) times daily as needed for cough.     Dulaglutide 0.75 MG/0.5ML SOPN INJECT 1 PEN INTO THE SKIN ONCE A WEEK. (Patient taking differently: Inject 0.75 mg into the skin every Sunday. Trulicity) 2 mL 12   empagliflozin (JARDIANCE) 25 MG TABS tablet Take 1 tablet (25 mg total) by mouth daily. 30 tablet 5   estradiol (ESTRACE) 0.1 MG/GM vaginal cream apply a pea size amount vaginally nightly for 2 weeks, then every other day 42.5 g 6   glucose blood (ACCU-CHEK GUIDE) test strip Use 1 strip finger stick up to three times a day to check blood sugar DXE11.69 300 strip 3   glucose blood (FREESTYLE LITE) test strip Use as directed up to 3 times daily 300 strip 3   Lancets (FREESTYLE) lancets Use as directed up to 3 times daily 300 each 3   Lancets Misc. MISC Use as directed 3 times daily 300 each 3   lisinopril-hydrochlorothiazide (ZESTORETIC) 20-12.5 MG tablet Take 1 tablet by mouth daily. 90 tablet 3   metFORMIN (GLUCOPHAGE) 1000 MG tablet Take 1 tablet (1,000 mg total) by mouth 2 (two) times daily. 180 tablet 3   metoprolol tartrate (LOPRESSOR) 25 MG tablet Take 0.5 tablets (12.5 mg total) by mouth 2 (two) times daily with a meal. 90 tablet 3   Multiple Vitamins-Calcium (ONE-A-DAY WOMENS PO) Take 1 tablet by mouth daily.     prednisoLONE acetate (PRED FORTE) 1 % ophthalmic suspension Place 1 drop into the left eye 4 (four) times daily. 5 mL 0   rosuvastatin (CRESTOR) 10 MG tablet Take 1 tablet (10 mg total) by mouth daily. 30 tablet 12   sitaGLIPtin (JANUVIA) 100 MG tablet Take 1 tablet (100 mg total) by mouth daily. 30 tablet 5   triamcinolone cream (KENALOG) 0.1 % Apply 1 application topically 2 (two) times daily as needed (for itching).     No current facility-administered medications on file prior to visit.    Allergies:   Allergies  Allergen Reactions   Pioglitazone Other (See Comments)    Headaches, myalgias, chest pain, and dizziness   Canagliflozin Palpitations   Statins  Nausea Only      OBJECTIVE:  Physical Exam  Vitals:   08/25/20 0906  BP: 126/77  Pulse: 76  Weight: 174 lb (78.9 kg)  Height: 5' 3" (1.6 m)   Body mass index is 30.82 kg/m. No results found.  Post stroke PHQ 2/9 Depression screen PHQ 2/9 08/25/2020  Decreased Interest 0  Down, Depressed, Hopeless 0  PHQ - 2 Score 0     General: well developed, well nourished, very pleasant middle-aged African-American female, seated, in no evident distress Head: head normocephalic and atraumatic.   Neck: supple with no carotid or supraclavicular bruits Cardiovascular: regular rate and rhythm, no murmurs Musculoskeletal: no deformity Skin:  no rash/petichiae Vascular:  Normal pulses all extremities   Neurologic Exam Mental Status: Awake and fully alert.  Fluent speech and language.  Oriented to place and time. Recent and remote memory intact. Attention span, concentration and fund of knowledge appropriate. Mood and affect appropriate.  Cranial Nerves: Fundoscopic exam reveals sharp disc margins. Pupils equal, briskly reactive to light. Extraocular movements full without nystagmus. Visual fields full to confrontation. Hearing intact. Facial sensation intact. Face, tongue, palate moves normally and symmetrically.  Motor: Normal bulk and tone. Normal strength in all tested extremity muscles except slight decrease.  Dexterity Sensory.: intact to touch , pinprick , position and vibratory sensation.  Coordination: Rapid alternating movements normal in all extremities except slight decreased right hand. Finger-to-nose and heel-to-shin performed accurately bilaterally. Gait and Station: Arises from chair without difficulty. Stance is normal. Gait demonstrates normal stride length and balance without use of assistive device. Tandem walk and heel toe with mild difficulty.  Reflexes: 1+ and symmetric. Toes downgoing.     NIHSS  0 Modified Rankin  1      ASSESSMENT: Brittany [name] is a 65 y.o.  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  Vascular risk factors include multiple prior strokes on imaging (left cerebellum, left thalamic bilateral BG including infarcts), HTN, HLD, DM, CHF and advanced age.      PLAN:  Left pontine stroke:  Residual deficit: Mild right hand weakness - provided exercises to do at home for likely ongoing recovery.  Continue clopidogrel 75 mg daily  and Crestor 10 mg daily for secondary stroke prevention.   Discussed secondary stroke prevention measures and importance of close PCP follow up for aggressive stroke risk factor management. I have gone over the pathophysiology of stroke, warning signs and symptoms, risk factors and their management in some detail with instructions to go to the closest emergency room for symptoms of concern. HTN: BP goal <130/90.  Stable on current regimen per PCP HLD: LDL goal <70. Recent LDL 62 on Crestor 10 mg daily per PCP.  Advised to continue DMII: A1c goal<7.0. Recent A1c 6.8.  Routinely monitors at home.  Managed by PCP    Follow up in 4 months or call earlier if needed   CC:  [facility] provider: Dr. Leonie Man PCP: Alroy Dust, L.Marlou Sa, MD    I spent 46 minutes of face-to-face and non-face-to-face time with patient.  This included previsit chart review including review of recent hospitalization, lab review, study review, electronic health record documentation, patient education regarding recent stroke and etiology, secondary stroke prevention measures and importance of managing stroke risk factors, residual deficits and typical recovery time and answered all other questions to patient satisfaction   Frann Rider, AGNP-BC  [facility] [address] [place], [place] [address]  Phone [phone] Fax [phone] Note: This document was prepared with digital dictation and possible  smart phrase technology. Any transcriptional errors that result from this process are unintentional.

## 2020-09-01 NOTE — Progress Notes (Signed)
I agree with the above plan 

## 2020-09-03 ENCOUNTER — Other Ambulatory Visit (HOSPITAL_COMMUNITY): Payer: Self-pay

## 2020-09-22 ENCOUNTER — Other Ambulatory Visit (HOSPITAL_COMMUNITY): Payer: Self-pay

## 2020-09-22 MED FILL — Dulaglutide Soln Auto-injector 0.75 MG/0.5ML: SUBCUTANEOUS | 28 days supply | Qty: 2 | Fill #3 | Status: AC

## 2020-09-30 ENCOUNTER — Other Ambulatory Visit (HOSPITAL_COMMUNITY): Payer: Self-pay

## 2020-10-14 ENCOUNTER — Other Ambulatory Visit (HOSPITAL_BASED_OUTPATIENT_CLINIC_OR_DEPARTMENT_OTHER): Payer: Self-pay

## 2020-10-19 MED FILL — Dulaglutide Soln Auto-injector 0.75 MG/0.5ML: SUBCUTANEOUS | 28 days supply | Qty: 2 | Fill #4 | Status: AC

## 2020-10-20 ENCOUNTER — Other Ambulatory Visit (HOSPITAL_COMMUNITY): Payer: Self-pay

## 2020-10-26 ENCOUNTER — Telehealth: Payer: Self-pay | Admitting: Adult Health

## 2020-10-26 NOTE — Telephone Encounter (Signed)
Pt states when she was in the hospital in July and they put her on clopidogrel (PLAVIX) 75 MG tablet she now feels it doesn't agree with her.Pt wants to know what Shanda Bumps, NP has to say

## 2020-10-26 NOTE — Telephone Encounter (Signed)
Called patient who stated about 3 weeks she has noticed soreness/tenderness up under both armpits, no change in deodorant. She denies bruising. Her ROM isn't affected. Two weeks ago she woke up and her knee was hurting, does have arthritis, but she couldn't bear weight on it. It is better now. She has notified her PCP who told her to contact neurology since patient feels it is form plavix. . She follows GYN regularly. I advised will let NP know and call her back. Patient verbalized understanding, appreciation.

## 2020-10-26 NOTE — Telephone Encounter (Signed)
Called patient and informed her of NP's reply and advised she have full work up by her PCP. Patient verbalized understanding, appreciation.

## 2020-10-26 NOTE — Telephone Encounter (Signed)
She was tolerating well at prior visit in August. Low suspicion from Plavix especially as these are not common side effects. She will need full evaluation from PCP at this point

## 2020-11-02 ENCOUNTER — Other Ambulatory Visit (HOSPITAL_COMMUNITY): Payer: Self-pay

## 2020-11-09 ENCOUNTER — Other Ambulatory Visit: Payer: Self-pay | Admitting: Hematology and Oncology

## 2020-11-09 DIAGNOSIS — Z1231 Encounter for screening mammogram for malignant neoplasm of breast: Secondary | ICD-10-CM

## 2020-11-16 MED FILL — Dulaglutide Soln Auto-injector 0.75 MG/0.5ML: SUBCUTANEOUS | 28 days supply | Qty: 2 | Fill #5 | Status: AC

## 2020-11-17 ENCOUNTER — Other Ambulatory Visit (HOSPITAL_COMMUNITY): Payer: Self-pay

## 2020-11-18 ENCOUNTER — Other Ambulatory Visit (HOSPITAL_COMMUNITY): Payer: Self-pay

## 2020-12-08 ENCOUNTER — Other Ambulatory Visit (HOSPITAL_COMMUNITY): Payer: Self-pay

## 2020-12-13 MED FILL — Dulaglutide Soln Auto-injector 0.75 MG/0.5ML: SUBCUTANEOUS | 28 days supply | Qty: 2 | Fill #6 | Status: AC

## 2020-12-14 ENCOUNTER — Other Ambulatory Visit (HOSPITAL_COMMUNITY): Payer: Self-pay

## 2020-12-16 ENCOUNTER — Other Ambulatory Visit: Payer: Self-pay | Admitting: Family Medicine

## 2020-12-16 ENCOUNTER — Ambulatory Visit
Admission: RE | Admit: 2020-12-16 | Discharge: 2020-12-16 | Disposition: A | Discharge status: Home / Self Care | Payer: HMO | Source: Ambulatory Visit | Attending: Hematology and Oncology | Admitting: Hematology and Oncology

## 2020-12-16 DIAGNOSIS — Z1231 Encounter for screening mammogram for malignant neoplasm of breast: Secondary | ICD-10-CM

## 2020-12-21 ENCOUNTER — Telehealth: Payer: Self-pay | Admitting: Adult Health

## 2020-12-21 ENCOUNTER — Encounter: Payer: Self-pay | Admitting: Adult Health

## 2020-12-21 ENCOUNTER — Ambulatory Visit: Payer: HMO | Admitting: Adult Health

## 2020-12-21 VITALS — BP 121/81 | HR 90 | Ht 63.0 in | Wt 180.0 lb

## 2020-12-21 DIAGNOSIS — I671 Cerebral aneurysm, nonruptured: Secondary | ICD-10-CM

## 2020-12-21 DIAGNOSIS — I639 Cerebral infarction, unspecified: Secondary | ICD-10-CM | POA: Diagnosis not present

## 2020-12-21 DIAGNOSIS — Z8673 Personal history of transient ischemic attack (TIA), and cerebral infarction without residual deficits: Secondary | ICD-10-CM | POA: Diagnosis not present

## 2020-12-21 NOTE — Progress Notes (Signed)
[facility] [address] [place]. [place] [address] [phone]       STROKE FOLLOW UP NOTE  Ms. Brittany [name] Date of Birth:  01/25/1955 Medical Record Number:  [id number]   Reason for Referral: stroke follow up    SUBJECTIVE:   CHIEF COMPLAINT:  Chief Complaint  Patient presents with   Follow-up    Rm 2  alone Pt is well and stable, no new concerns      HPI:   Update 12/21/2020 JM: Returns for 21-monthstroke follow-up unaccompanied  Overall stable without new stroke/TIA symptoms Denies any continued right hand numbness or weakness Continues to maintain ADLs and IADLs independently.  Continues to work without difficulty.  Compliant on Plavix and Crestor -denies side effects Blood pressure today 121/81 - stable at home  Glucose levels stable at home  Has f/u in Jan with PCP with plans on repeat lab work  No new concerns at this time    History provided for reference purposes only Initial visit 08/25/2020 JM: Ms. CShoemakeris being seen for hospital follow-up unaccompanied.  Returned to ED 07/21/2020 for dizziness and feeling of unsteadiness with MR brain negative for new stroke and lab work unremarkable.  She has since been doing well without residual imbalance, dizziness or unsteadiness. Initially had issues with right hand weakness and occasional numbness/tingling more noticeable with writing but has since greatly improved with only intermittent tingling sensation.  She plans on returning back to work this week at [employer]  Denies new stroke/TIA symptoms.  Completed 3 weeks DAPT and remains on Plavix as well as Crestor without side effects.  Blood pressure today 126/77. Glucose levels 120s-130s.  No further concerns at this time.  Stroke admission 07/19/2020 Ms. Brittany SURITAis a 65y.o. female with history of hypertension, hyperlipidemia, diabetes, CHF admitted on 07/19/2020 for lightheadedness and imbalance. No tPA given due to  outside window.  Personally reviewed hospitalization pertinent progress notes, lab work and imaging.  Evaluated by Dr. 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 MRI also showed old left cerebellum, left thalamic and bilateral BG lacunar infarcts.  CTA head/neck unremarkable.  EF 55 to 60%.  LDL 62.  A1c 6.8.  On aspirin PTA -recommended DAPT for 3 weeks and Plavix alone as well as continuation of Crestor 10 mg daily.  Other stroke risk factors include HTN, CHF, advanced age and prior strokes on imaging.  PT/OT no recommendations    PERTINENT IMAGING  Per hospitalization 07/2020 CT no acute abnormality CTA head and neck no evidence of stenosis, 166mL ICA small aneurysm vs infundibulum  MRI left pontine infarct, old left cerebellum, left thalamic, bilateral BG lacunar infarcts 2D Echo EF 55 to 60% LDL 62 HgbA1c 6.8      ROS:   14 system review of systems performed and negative with exception of those listed in HPI  PMH:  Past Medical History:  Diagnosis Date   [place]   Hyperlipidemia    Hypertension     PSH: History reviewed. No pertinent surgical history.  Social History:  Social History   Socioeconomic History   Marital status: Married    Spouse name: Not on file   Number of children: Not on file   Years of education: Not on file   Highest education level: Not on file  Occupational History   Not on file  Tobacco Use   Smoking status: Former  Smokeless tobacco: Never  Substance and Sexual Activity   Alcohol use: No    Alcohol/week: 0.0 standard drinks   Drug use: No   Sexual activity: Yes  Other Topics Concern   Not on file  Social History Narrative   Not on file   Social Determinants of Health   Financial Resource Strain: Not on file  Food Insecurity: Not on file  Transportation Needs: Not on file  Physical Activity: Not on file  Stress: Not on file  Social Connections: Not on file   Intimate Partner Violence: Not on file    Family History:  Family History  Problem Relation Age of Onset   Heart block Mother    Asthma Other    Hypertension Other    Hyperlipidemia Other    Diabetes Other     Medications:   Current Outpatient Medications on File Prior to Visit  Medication Sig Dispense Refill   acetaminophen (TYLENOL) 500 MG tablet Take 500 mg by mouth every 6 (six) hours as needed for mild pain.     Blood Glucose Monitoring Suppl (FREESTYLE LITE) w/Device KIT Use as directed three times daily 1 kit 0   clopidogrel (PLAVIX) 75 MG tablet Take 1 tablet (75 mg total) by mouth daily. 90 tablet 1   dextromethorphan-guaiFENesin (MUCINEX DM) 30-600 MG 12hr tablet Take 1 tablet by mouth 2 (two) times daily as needed for cough.     Dulaglutide 0.75 MG/0.5ML SOPN INJECT 1 PEN INTO THE SKIN ONCE A WEEK. (Patient taking differently: Inject 0.75 mg into the skin every Sunday. Trulicity) 2 mL 12   empagliflozin (JARDIANCE) 25 MG TABS tablet Take 1 tablet (25 mg total) by mouth daily. 30 tablet 5   estradiol (ESTRACE) 0.1 MG/GM vaginal cream apply a pea size amount vaginally nightly for 2 weeks, then every other day 42.5 g 6   glucose blood (FREESTYLE LITE) test strip Use as directed up to 3 times daily 300 strip 3   Lancets (FREESTYLE) lancets Use as directed up to 3 times daily 300 each 3   Lancets Misc. MISC Use as directed 3 times daily 300 each 3   lisinopril-hydrochlorothiazide (ZESTORETIC) 20-12.5 MG tablet Take 1 tablet by mouth daily. 90 tablet 3   metFORMIN (GLUCOPHAGE) 1000 MG tablet Take 1 tablet (1,000 mg total) by mouth 2 (two) times daily. 180 tablet 3   metoprolol tartrate (LOPRESSOR) 25 MG tablet Take 0.5 tablets (12.5 mg total) by mouth 2 (two) times daily with a meal. 90 tablet 3   Multiple Vitamins-Calcium (ONE-A-DAY WOMENS PO) Take 1 tablet by mouth daily.     prednisoLONE acetate (PRED FORTE) 1 % ophthalmic suspension Place 1 drop into the left eye 4 (four)  times daily. 5 mL 0   rosuvastatin (CRESTOR) 10 MG tablet Take 1 tablet (10 mg total) by mouth daily. 30 tablet 12   sitaGLIPtin (JANUVIA) 100 MG tablet Take 1 tablet (100 mg total) by mouth daily. 30 tablet 5   triamcinolone cream (KENALOG) 0.1 % Apply 1 application topically 2 (two) times daily as needed (for itching).     No current facility-administered medications on file prior to visit.    Allergies:   Allergies  Allergen Reactions   Pioglitazone Other (See Comments)    Headaches, myalgias, chest pain, and dizziness   Canagliflozin Palpitations   Statins Nausea Only      OBJECTIVE:  Physical Exam  Vitals:   12/21/20 0927  BP: 121/81  Pulse: 90  Weight: 180 lb (  81.6 kg)  Height: '5\' 3"'  (1.6 m)    Body mass index is 31.89 kg/m. No results found.  General: well developed, well nourished, very pleasant middle-aged African-American female, seated, in no evident distress Head: head normocephalic and atraumatic.   Neck: supple with no carotid or supraclavicular bruits Cardiovascular: regular rate and rhythm, no murmurs Musculoskeletal: no deformity Skin:  no rash/petichiae Vascular:  Normal pulses all extremities   Neurologic Exam Mental Status: Awake and fully alert.  Fluent speech and language.  Oriented to place and time. Recent and remote memory intact. Attention span, concentration and fund of knowledge appropriate. Mood and affect appropriate.  Cranial Nerves: Pupils equal, briskly reactive to light. Extraocular movements full without nystagmus. Visual fields full to confrontation. Hearing intact. Facial sensation intact. Face, tongue, palate moves normally and symmetrically.  Motor: Normal bulk and tone. Normal strength in all tested extremity muscles Sensory.: intact to touch , pinprick , position and vibratory sensation.  Coordination: Rapid alternating movements normal in all extremities. Finger-to-nose mild incoordination RUE and heel-to-shin performed  accurately bilaterally. Gait and Station: Arises from chair without difficulty. Stance is normal. Gait demonstrates normal stride length and balance without use of assistive device. Tandem walk and heel toe with mild difficulty.  Reflexes: 1+ and symmetric. Toes downgoing.         ASSESSMENT: Brittany [name] is a 65 y.o. 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  Vascular risk factors include multiple prior strokes on imaging (left cerebellum, left thalamic bilateral BG including infarcts), HTN, HLD, DM, CHF and advanced age.      PLAN:  Left pontine stroke:  Residual deficit: Mild RUE incoordination -discussed continued exercises for hopeful ongoing recovery. Does not interfere with daily functioning or activity Continue clopidogrel 75 mg daily  and Crestor 10 mg daily for secondary stroke prevention.  Advised refills managed by PCP as these will be life long medications Discussed secondary stroke prevention measures and importance of close PCP follow up for aggressive stroke risk factor management. I have gone over the pathophysiology of stroke, warning signs and symptoms, risk factors and their management in some detail with instructions to go to the closest emergency room for symptoms of concern. HTN: BP goal <130/90.  Stable on current regimen per PCP HLD: LDL goal <70. Prior LDL 62 on Crestor 10 mg daily per PCP.  F/u with PCP next month for repeat labs DMII: A1c goal<7.0. prior  A1c 6.8.  Managed by PCP - f/u next month for repeat labs Cerebral aneurysm: CTA 07/2020 8m outpouching L ICA representing small aneurysm vs infundibulum. Repeat CTA head/neck around 01/2021 for repeat imaging/surveillance monitoring. No fm hx. Remote tobacco use - quite early 2000s.     Follow up in 6 months or call earlier if needed   CC:  PCP: MAlroy Dust L.DMarlou Sa MD    I spent 38 minutes  of face-to-face and non-face-to-face time with patient.  This included previsit chart review, lab review, study review, electronic health record documentation, patient education regarding prior stroke and etiology, secondary stroke prevention measures and importance of managing stroke risk factors, residual deficits, cerebral aneurysm with further education and indication for repeat imaging, and answered all other questions to patient satisfaction   JFrann Rider AGNP-BC  [facility] [address]SDaly CityGOnarga [place] [address] Phone [phone] [phone]: This document was prepared with digital dictation and possible  smart Company secretary. Any transcriptional errors that result from this process are unintentional.

## 2020-12-21 NOTE — Telephone Encounter (Signed)
health team order sent to [facility], [facility] they will reach out to the patient to schedule.  

## 2020-12-21 NOTE — Patient Instructions (Addendum)
Continue clopidogrel 75 mg daily  and Crestor for secondary stroke prevention - your PCP can provide refills ongoing as both these medications will be lifelong   Continue to follow up with PCP regarding cholesterol, diabetes and blood pressure management  Maintain strict control of hypertension with blood pressure goal below 130/90, diabetes with hemoglobin A1c goal below 7.0 % and cholesterol with LDL cholesterol (bad cholesterol) goal below 70 mg/dL.   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 At the current size of 26mm, this does not require any type of surgical procedure or intervention.  Repeat imaging can re-evaluate this area to see if this is still present and if so, to ensure this has not enlarged. You will be called to schedule    Follow-up in 6 months or call earlier if needed    Thank you for coming to see [place] at [facility]. I hope we have been able to provide you high quality care today.  You may receive a patient satisfaction survey over the next few weeks. We would appreciate your feedback and comments so that we may continue to improve ourselves and the health of our patients.     Tobacco tobacco cerebral Aneurysm A cerebral aneurysm is a bulge that occurs in a blood vessel (artery) inside the brain. An aneurysm is caused when a weakened part of the blood vessel expands. The blood vessel expands due to the constant pressure from the flow of blood through the weakened blood vessel. As the aneurysm expands, the walls of the aneurysm become weaker. Aneurysms are dangerous because they can leak or burst (rupture). When a cerebral aneurysm ruptures, it causes bleeding in the brain (subarachnoid hemorrhage). The blood flow to the area of the brain supplied by the artery is also reduced. This can cause a stroke, seizures, or a  coma. A ruptured cerebral aneurysm is a medical emergency. This can cause permanent brain damage or death. What are the causes? The exact cause of this condition is not known. What increases the risk? The following factors may make you more likely to develop this condition: Being older. The condition is most common in people between the ages of 42 and 11. Being female. Having a family history of aneurysm in two or more direct relatives. Having certain conditions that are passed along from parent to child (inherited). They include: Autosomal dominant polycystic kidney disease. This is a condition in which small, fluid-filled sacs (cysts) develop in the kidney. Neurofibromatosis type 1. In this condition, flat spots develop under the skin (pigmentation) and tumors grow along nerves in the skin, brain, and other parts of the body. Ehlers-Danlos syndrome. This is a condition in which bad connective tissue causes loose or unstable joints and creates a very soft skin that bruises or tears easily. Smoking. Having high blood pressure (hypertension). Abusing alcohol. What are the signs or symptoms? The symptoms of a cerebral aneurysm that has not leaked or ruptured can depend on its size and rate of growth. A small, unchanging aneurysm generally does not cause symptoms. A larger aneurysm that is steadily growing can increase pressure on the brain or nerves. This increased pressure can cause: A headache. Vision problems. Numbness or weakness in an arm or leg. Memory problems. Problems speaking. Seizures. If an aneurysm leaks or ruptures, it can cause a life-threatening condition, such as a stroke.  Symptoms may include: A sudden, severe headache with no known cause. The headache is often described as the worst headache ever experienced. Stiff neck. Nausea or vomiting, especially when combined with other symptoms, such as a headache. Sudden weakness or numbness of the face, arm, or leg, especially on  one side of the body. Sudden trouble walking or difficulty moving the arms or legs. Double vision or sudden trouble seeing in one or both eyes. Trouble speaking or understanding speech. Trouble swallowing. Dizziness. Loss of balance or coordination. Intolerance to light. Sudden confusion or loss of consciousness. How is this diagnosed? This condition is diagnosed using certain tests, including: CT scan. Computed tomographic angiogram (CTA). This test uses a dye and a scanner to produce images of your blood vessels. Magnetic resonance angiogram (MRA). This test uses an MRI machine to produce images of your blood vessels. Digital subtraction angiogram (DSA). This test involves placing a long, thin tube (catheter) into the artery in your thigh and guiding it up to the arteries in the brain. A dye is then injected into the area, and X-rays are taken to create images of your blood vessels. How is this treated? Unruptured aneurysm Treatment for an aneurysm that is not causing problems will depend on many factors, such as the size and location of the aneurysm, your age, your overall health, and your preferences. Small aneurysms in certain locations of the brain have a very low chance of bleeding or rupturing. These small aneurysms may not need to be treated. Your health care provider may monitor the aneurysm regularly to check for any changes. In some cases, however, treatment may be required because of the size or location of an aneurysm. Treatment options may include: Coiling. During this procedure, a catheter is inserted and advanced through a blood vessel. Once the catheter reaches the aneurysm, tiny coils are used to block blood flow into the aneurysm. This procedure is sometimes done at the same time as a DSA. Surgical clipping. During surgery, a clip is placed at the base of the aneurysm. The clip prevents blood from continuing to enter the aneurysm. Flow diversion. This procedure is used to  divert blood flow around the aneurysm with a stent that is placed across the opening of an aneurysm. Ruptured aneurysm For a ruptured aneurysm, emergency surgery or coiling is often needed right away to help prevent damage to the brain and to reduce the risk of rebleeding. Follow these instructions at home: If your aneurysm is not treated: Take over-the-counter and prescription medicines only as told by your health care provider. Follow a diet suggested by your health care provider. Certain dietary changes may be advised to address hypertension, such as choosing foods that are low in salt (sodium), saturated fat, trans fat, and cholesterol. Stay physically active. Try to get at least 30 minutes of activity on most or all days of the week. Do not use any products that contain nicotine or tobacco. These products include cigarettes, chewing tobacco, and vaping devices, such as e-cigarettes. If you need help quitting, ask your health care provider. If you drink alcohol: Limit how much you have to: 0-1 drink a day for women who are not pregnant. 0-2 drinks a day for men. Know how much alcohol is in your drink. In the [place] one drink equals one 12 oz bottle of beer (355 mL), one 5 oz glass of wine (148 mL), or one 1 oz glass of hard liquor (44 mL). Do not use drugs. If you need help  quitting, ask your health care provider. Keep all follow-up visits. This is important. This includes any referrals, imaging studies, and lab tests. Proper follow-up may prevent an aneurysm rupture or a stroke. Get help right away if: You have a sudden, severe headache with no known cause. This may include a stiff neck. You have sudden nausea or vomiting with a severe headache. You have a seizure. You have other symptoms of a stroke. "BE FAST" is an easy way to remember the main warning signs of a stroke: B - Balance. Signs are dizziness, sudden trouble walking, or loss of balance. E - Eyes. Signs are trouble seeing or a  sudden change in vision. F - Face. Signs are sudden weakness or numbness of the face, or the face or eyelid drooping on one side. A - Arms. Signs are weakness or numbness in an arm. This happens suddenly and usually on one side of the body. S - Speech. Signs are sudden trouble speaking, slurred speech, or trouble understanding what people say. T - Time. Time to call emergency services. Write down what time symptoms started. These symptoms may represent a serious problem that is an emergency. Do not wait to see if the symptoms will go away. Get medical help right away. Call your local emergency services (911 in the [place] Do not drive yourself to the hospital. Summary A cerebral aneurysm is a bulge that occurs in a blood vessel (artery) inside the brain. Aneurysms are dangerous because they can leak or burst (rupture). When a cerebral aneurysm ruptures, it causes bleeding in the brain. Treatment depends on many factors, including the size and location of the aneurysm and whether it is ruptured. A ruptured aneurysm is a medical emergency. Get help right away if you have symptoms of a stroke. "BE FAST" is an easy way to remember the main warning signs of a stroke. This information is not intended to replace advice given to you by your health care provider. Make sure you discuss any questions you have with your health care provider. Document Revised: 09/17/2019 Document Reviewed: 09/17/2019 Elsevier Patient Education  2022 [employer].

## 2021-01-05 ENCOUNTER — Other Ambulatory Visit (HOSPITAL_COMMUNITY): Payer: Self-pay

## 2021-01-06 ENCOUNTER — Other Ambulatory Visit (HOSPITAL_COMMUNITY): Payer: Self-pay

## 2021-01-06 ENCOUNTER — Other Ambulatory Visit: Payer: Self-pay

## 2021-01-06 MED ORDER — JARDIANCE 25 MG PO TABS
25.0000 mg | ORAL_TABLET | Freq: Every day | ORAL | 5 refills | Status: DC
  Filled 2021-01-06: qty 30, 30d supply, fill #0
  Filled 2021-02-02: qty 30, 30d supply, fill #1
  Filled 2021-03-06: qty 30, 30d supply, fill #2
  Filled 2021-04-04: qty 30, 30d supply, fill #3
  Filled 2021-05-15: qty 30, 30d supply, fill #4
  Filled 2021-07-10: qty 30, 30d supply, fill #5

## 2021-01-13 ENCOUNTER — Other Ambulatory Visit (HOSPITAL_COMMUNITY): Payer: Self-pay

## 2021-01-13 MED ORDER — JANUVIA 100 MG PO TABS
100.0000 mg | ORAL_TABLET | Freq: Every day | ORAL | 5 refills | Status: DC
Start: 2021-01-13 — End: 2021-07-12
  Filled 2021-01-13: qty 30, 30d supply, fill #0
  Filled 2021-02-08: qty 30, 30d supply, fill #1
  Filled 2021-03-06: qty 30, 30d supply, fill #2
  Filled 2021-04-04: qty 30, 30d supply, fill #3
  Filled 2021-05-15: qty 30, 30d supply, fill #4
  Filled 2021-06-11: qty 30, 30d supply, fill #5

## 2021-01-13 MED FILL — Dulaglutide Soln Auto-injector 0.75 MG/0.5ML: SUBCUTANEOUS | 28 days supply | Qty: 2 | Fill #7 | Status: AC

## 2021-01-14 ENCOUNTER — Other Ambulatory Visit (HOSPITAL_COMMUNITY): Payer: Self-pay | Admitting: Student

## 2021-01-14 ENCOUNTER — Other Ambulatory Visit (HOSPITAL_COMMUNITY): Payer: Self-pay

## 2021-01-15 ENCOUNTER — Other Ambulatory Visit: Payer: HMO

## 2021-01-15 ENCOUNTER — Ambulatory Visit
Admission: RE | Admit: 2021-01-15 | Discharge: 2021-01-15 | Disposition: A | Discharge status: Home / Self Care | Payer: Medicare Other | Source: Ambulatory Visit | Attending: Adult Health | Admitting: Adult Health

## 2021-01-15 ENCOUNTER — Other Ambulatory Visit (HOSPITAL_COMMUNITY): Payer: Self-pay

## 2021-01-15 DIAGNOSIS — I6522 Occlusion and stenosis of left carotid artery: Secondary | ICD-10-CM | POA: Diagnosis not present

## 2021-01-15 DIAGNOSIS — I700 Atherosclerosis of aorta: Secondary | ICD-10-CM | POA: Diagnosis not present

## 2021-01-15 DIAGNOSIS — I720 Aneurysm of carotid artery: Secondary | ICD-10-CM | POA: Diagnosis not present

## 2021-01-15 DIAGNOSIS — I671 Cerebral aneurysm, nonruptured: Secondary | ICD-10-CM

## 2021-01-15 MED ORDER — IOPAMIDOL (ISOVUE-370) INJECTION 76%
75.0000 mL | Freq: Once | INTRAVENOUS | Status: AC | PRN
  Administered 2021-01-15: 75 mL via INTRAVENOUS

## 2021-01-18 ENCOUNTER — Telehealth: Payer: Self-pay

## 2021-01-18 ENCOUNTER — Other Ambulatory Visit (HOSPITAL_COMMUNITY): Payer: Self-pay

## 2021-01-18 MED ORDER — CLOPIDOGREL BISULFATE 75 MG PO TABS
75.0000 mg | ORAL_TABLET | Freq: Every day | ORAL | 0 refills | Status: DC
  Filled 2021-01-18: qty 90, 90d supply, fill #0

## 2021-01-18 NOTE — Telephone Encounter (Signed)
Contacted pt, informing her recent imaging showed stable appearance of small outpouching in left ICA possibly representing aneurysm vs infundibulum (enlargement in that area that is not believed to be at risk of rupture). We will plan on repeating imaging in 1 year for monitoring per Shanda Bumps. Pt was appreciative with  no questions.

## 2021-01-18 NOTE — Telephone Encounter (Signed)
-----   Message from Ihor Austin, NP sent at 01/18/2021  8:15 AM EST ----- Please advise patient recent imaging showed stable appearance of small outpouching in left ICA possibly representing aneurysm vs infundibulum (enlargement in that area that is not believed to be at risk of rupture). We will plan on repeating imaging in 1 year for monitoring.

## 2021-01-19 ENCOUNTER — Other Ambulatory Visit (HOSPITAL_COMMUNITY): Payer: Self-pay

## 2021-01-20 ENCOUNTER — Other Ambulatory Visit (HOSPITAL_COMMUNITY): Payer: Self-pay

## 2021-01-28 ENCOUNTER — Other Ambulatory Visit (HOSPITAL_COMMUNITY): Payer: Self-pay

## 2021-01-28 DIAGNOSIS — E7800 Pure hypercholesterolemia, unspecified: Secondary | ICD-10-CM | POA: Diagnosis not present

## 2021-01-28 DIAGNOSIS — Z8673 Personal history of transient ischemic attack (TIA), and cerebral infarction without residual deficits: Secondary | ICD-10-CM | POA: Diagnosis not present

## 2021-01-28 DIAGNOSIS — E1169 Type 2 diabetes mellitus with other specified complication: Secondary | ICD-10-CM | POA: Diagnosis not present

## 2021-01-28 DIAGNOSIS — Z0000 Encounter for general adult medical examination without abnormal findings: Secondary | ICD-10-CM | POA: Diagnosis not present

## 2021-01-28 DIAGNOSIS — Z23 Encounter for immunization: Secondary | ICD-10-CM | POA: Diagnosis not present

## 2021-01-28 DIAGNOSIS — I10 Essential (primary) hypertension: Secondary | ICD-10-CM | POA: Diagnosis not present

## 2021-02-02 ENCOUNTER — Other Ambulatory Visit (HOSPITAL_COMMUNITY): Payer: Self-pay

## 2021-02-03 ENCOUNTER — Other Ambulatory Visit (HOSPITAL_COMMUNITY): Payer: Self-pay

## 2021-02-05 ENCOUNTER — Other Ambulatory Visit (HOSPITAL_COMMUNITY): Payer: Self-pay

## 2021-02-05 MED FILL — Dulaglutide Soln Auto-injector 0.75 MG/0.5ML: SUBCUTANEOUS | 28 days supply | Qty: 2 | Fill #8 | Status: AC

## 2021-02-08 ENCOUNTER — Other Ambulatory Visit (HOSPITAL_COMMUNITY): Payer: Self-pay

## 2021-02-09 ENCOUNTER — Other Ambulatory Visit: Payer: Self-pay | Admitting: Family Medicine

## 2021-02-09 ENCOUNTER — Other Ambulatory Visit: Payer: Self-pay

## 2021-02-09 DIAGNOSIS — E2839 Other primary ovarian failure: Secondary | ICD-10-CM

## 2021-02-15 ENCOUNTER — Other Ambulatory Visit (HOSPITAL_COMMUNITY): Payer: Self-pay

## 2021-02-23 ENCOUNTER — Other Ambulatory Visit (HOSPITAL_COMMUNITY): Payer: Self-pay

## 2021-02-24 ENCOUNTER — Other Ambulatory Visit (HOSPITAL_COMMUNITY): Payer: Self-pay

## 2021-03-01 DIAGNOSIS — Z4689 Encounter for fitting and adjustment of other specified devices: Secondary | ICD-10-CM | POA: Diagnosis not present

## 2021-03-04 ENCOUNTER — Other Ambulatory Visit (HOSPITAL_COMMUNITY): Payer: Self-pay

## 2021-03-06 MED FILL — Dulaglutide Soln Auto-injector 0.75 MG/0.5ML: SUBCUTANEOUS | 28 days supply | Qty: 2 | Fill #9 | Status: CN

## 2021-03-08 ENCOUNTER — Other Ambulatory Visit (HOSPITAL_COMMUNITY): Payer: Self-pay

## 2021-03-08 MED ORDER — TRULICITY 0.75 MG/0.5ML ~~LOC~~ SOAJ
0.7500 mg | SUBCUTANEOUS | 5 refills | Status: DC
Start: 2021-03-08 — End: 2021-08-24
  Filled 2021-03-08: qty 2, 28d supply, fill #0
  Filled 2021-04-04: qty 2, 28d supply, fill #1
  Filled 2021-05-06: qty 2, 28d supply, fill #2
  Filled 2021-05-31: qty 2, 28d supply, fill #3
  Filled 2021-06-29: qty 2, 28d supply, fill #4
  Filled 2021-07-25: qty 2, 28d supply, fill #5

## 2021-03-10 ENCOUNTER — Other Ambulatory Visit (HOSPITAL_COMMUNITY): Payer: Self-pay

## 2021-03-25 DIAGNOSIS — H1045 Other chronic allergic conjunctivitis: Secondary | ICD-10-CM | POA: Diagnosis not present

## 2021-03-25 DIAGNOSIS — E119 Type 2 diabetes mellitus without complications: Secondary | ICD-10-CM | POA: Diagnosis not present

## 2021-03-25 DIAGNOSIS — I639 Cerebral infarction, unspecified: Secondary | ICD-10-CM | POA: Diagnosis not present

## 2021-03-25 DIAGNOSIS — Z961 Presence of intraocular lens: Secondary | ICD-10-CM | POA: Diagnosis not present

## 2021-04-01 ENCOUNTER — Other Ambulatory Visit (HOSPITAL_COMMUNITY): Payer: Self-pay

## 2021-04-02 ENCOUNTER — Other Ambulatory Visit (HOSPITAL_COMMUNITY): Payer: Self-pay

## 2021-04-02 MED ORDER — CLOPIDOGREL BISULFATE 75 MG PO TABS
75.0000 mg | ORAL_TABLET | Freq: Every day | ORAL | 1 refills | Status: DC
  Filled 2021-04-02: qty 90, 90d supply, fill #0
  Filled 2021-07-10: qty 90, 90d supply, fill #1

## 2021-04-05 ENCOUNTER — Other Ambulatory Visit (HOSPITAL_COMMUNITY): Payer: Self-pay

## 2021-04-12 ENCOUNTER — Other Ambulatory Visit (HOSPITAL_COMMUNITY): Payer: Self-pay

## 2021-04-13 ENCOUNTER — Other Ambulatory Visit (HOSPITAL_COMMUNITY): Payer: Self-pay

## 2021-05-04 ENCOUNTER — Other Ambulatory Visit (HOSPITAL_COMMUNITY): Payer: Self-pay

## 2021-05-06 ENCOUNTER — Other Ambulatory Visit (HOSPITAL_COMMUNITY): Payer: Self-pay

## 2021-05-11 ENCOUNTER — Other Ambulatory Visit (HOSPITAL_COMMUNITY): Payer: Self-pay

## 2021-05-14 ENCOUNTER — Other Ambulatory Visit (HOSPITAL_COMMUNITY): Payer: Self-pay

## 2021-05-17 ENCOUNTER — Other Ambulatory Visit (HOSPITAL_COMMUNITY): Payer: Self-pay

## 2021-05-18 ENCOUNTER — Other Ambulatory Visit (HOSPITAL_COMMUNITY): Payer: Self-pay

## 2021-05-19 ENCOUNTER — Other Ambulatory Visit (HOSPITAL_COMMUNITY): Payer: Self-pay

## 2021-05-20 ENCOUNTER — Other Ambulatory Visit (HOSPITAL_COMMUNITY): Payer: Self-pay

## 2021-05-20 MED ORDER — GLUCOSE BLOOD VI STRP
ORAL_STRIP | 1 refills | Status: AC
  Filled 2021-05-20: qty 200, 100d supply, fill #0
  Filled 2021-08-22: qty 200, 100d supply, fill #1

## 2021-05-20 MED ORDER — ACCU-CHEK GUIDE W/DEVICE KIT
PACK | 0 refills | Status: AC
  Filled 2021-05-20: qty 1, 1d supply, fill #0

## 2021-05-20 MED ORDER — ACCU-CHEK SOFTCLIX LANCETS MISC
1 refills | Status: DC
  Filled 2021-05-20: qty 200, 100d supply, fill #0
  Filled 2021-08-22: qty 200, 100d supply, fill #1

## 2021-05-21 ENCOUNTER — Other Ambulatory Visit (HOSPITAL_COMMUNITY): Payer: Self-pay

## 2021-05-31 ENCOUNTER — Other Ambulatory Visit (HOSPITAL_COMMUNITY): Payer: Self-pay

## 2021-06-11 ENCOUNTER — Other Ambulatory Visit (HOSPITAL_COMMUNITY): Payer: Self-pay

## 2021-06-15 ENCOUNTER — Other Ambulatory Visit (HOSPITAL_COMMUNITY): Payer: Self-pay

## 2021-06-21 ENCOUNTER — Encounter: Payer: Self-pay | Admitting: Adult Health

## 2021-06-21 ENCOUNTER — Ambulatory Visit (INDEPENDENT_AMBULATORY_CARE_PROVIDER_SITE_OTHER): Payer: Medicare Other | Admitting: Adult Health

## 2021-06-21 VITALS — BP 130/74 | HR 78 | Ht 63.0 in | Wt 174.0 lb

## 2021-06-21 DIAGNOSIS — I671 Cerebral aneurysm, nonruptured: Secondary | ICD-10-CM

## 2021-06-21 DIAGNOSIS — I639 Cerebral infarction, unspecified: Secondary | ICD-10-CM | POA: Diagnosis not present

## 2021-06-21 NOTE — Patient Instructions (Addendum)
Recommendations:   We will plan on repeat CT angiogram around January 2024 for monitoring if your suspected cerebral aneurysm - if this remains stable, I will further discuss ongoing need of monitoring with Dr. Pearlean Brownie   Continue clopidogrel 75 mg daily  and Crestor for secondary stroke prevention which can be managed by your PCP Dr. Clovis Riley  Continue to follow up with PCP regarding cholesterol, blood pressure and diabetes management  Maintain strict control of hypertension with blood pressure goal below 130/90, diabetes with hemoglobin A1c goal below 7.0 % and cholesterol with LDL cholesterol (bad cholesterol) goal below 70 mg/dL.   Signs of a Stroke? Follow the BEFAST method:  Balance Watch for a sudden loss of balance, trouble with coordination or vertigo Eyes Is there a sudden loss of vision in one or both eyes? Or double vision?  Face: Ask the person to smile. Does one side of the face droop or is it numb?  Arms: Ask the person to raise both arms. Does one arm drift downward? Is there weakness or numbness of a leg? Speech: Ask the person to repeat a simple phrase. Does the speech sound slurred/strange? Is the person confused ? Time: If you observe any of these signs, call 911.       Thank you for coming to see [place] at [facility]. I hope we have been able to provide you high quality care today.  You may receive a patient satisfaction survey over the next few weeks. We would appreciate your feedback and comments so that we may continue to improve ourselves and the health of our patients.

## 2021-06-21 NOTE — Progress Notes (Signed)
[facility] [address] [place] [address] [phone]       STROKE FOLLOW UP NOTE  Brittany [name] Date of Birth:  [birth year] Medical Record Number:  [id number]   Primary neurologist: Dr. Leonie [name] Reason for Referral: stroke follow up    SUBJECTIVE:   CHIEF COMPLAINT:  Chief Complaint  Patient presents with   Left pontine stroke    Rm 3, 6 month FU "feeling fine, no new concerns"     HPI:   Update 06/21/2021 JM: Patient returns for 25-monthstroke follow-up unaccompanied.  Overall stable without new or reoccurring stroke/TIA symptoms.  Denies residual deficits.  Maintains ADLs and IADLs independently.  Compliant on Plavix and Crestor, denies side effects.  Blood pressure today 130/74. Routinely monitors at home, typically 120s/70-80s.  Glucose routinely monitored at home which has been stable.  PCP follow-up next week, plans on completing lab work - prior labs satisfactory per patient (unable to view via epic).  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  No new concerns at this time.     History provided for reference purposes only Update 12/21/2020 JM: Returns for 441-monthtroke follow-up unaccompanied  Overall stable without new stroke/TIA symptoms Denies any continued right hand numbness or weakness Continues to maintain ADLs and IADLs independently.  Continues to work without difficulty.  Compliant on Plavix and Crestor -denies side effects Blood pressure today 121/81 - stable at home  Glucose levels stable at home  Has f/u in Jan with PCP with plans on repeat lab work  No new concerns at this time  Initial visit 08/25/2020 JM: Ms. Brittany [name] being seen for hospital follow-up unaccompanied.  Returned to ED 07/21/2020 for dizziness and feeling of unsteadiness with MR brain negative for new stroke and lab work unremarkable.  She has since been doing well without residual imbalance, dizziness or  unsteadiness. Initially had issues with right hand weakness and occasional numbness/tingling more noticeable with writing but has since greatly improved with only intermittent tingling sensation.  She plans on returning back to work this week at [employer] Denies new stroke/TIA symptoms.  Completed 3 weeks DAPT and remains on Plavix as well as Crestor without side effects.  Blood pressure today 126/77. Glucose levels 120s-130s.  No further concerns at this time.  Stroke admission 07/19/2020 Ms. Brittany SKALLAs a [age].o. female with history of hypertension, hyperlipidemia, diabetes, CHF admitted on 07/19/2020 for lightheadedness and imbalance. No tPA given due to outside window.  Personally reviewed hospitalization pertinent progress notes, lab work and imaging.  Evaluated by Dr. XuErlinda Hongor left pontine infarct likely secondary to small vessel disease. MRI also showed old left cerebellum, left thalamic and bilateral BG lacunar infarcts.  CTA head/neck unremarkable.  EF 55 to 60%.  LDL 62.  A1c 6.8.  On aspirin PTA -recommended DAPT for 3 weeks and Plavix alone as well as continuation of Crestor 10 mg daily.  Other stroke risk factors include HTN, CHF, advanced age and prior strokes on imaging.  PT/OT no recommendations    PERTINENT IMAGING  Per hospitalization 07/2020 CT no acute abnormality CTA head and neck no evidence of stenosis, 8m19m ICA small aneurysm vs infundibulum  MRI left pontine infarct, old left cerebellum, left thalamic, bilateral BG lacunar infarcts 2D Echo EF 55 to 60% LDL 62 HgbA1c 6.8      ROS:   14 system review of systems performed and negative  with exception of those listed in HPI  PMH:  Past Medical History:  Diagnosis Date   Diabetes mellitus without complication ([place])    Hyperlipidemia    Hypertension    Stroke ([place])     PSH: No past surgical history on file.  Social History:  Social History   Socioeconomic History   Marital status:  Married    Spouse name: Not on file   Number of children: Not on file   Years of education: Not on file   Highest education level: Not on file  Occupational History   Not on file  Tobacco Use   Smoking status: Former   Smokeless tobacco: Never  Substance and Sexual Activity   Alcohol use: No    Alcohol/week: 0.0 standard drinks of alcohol   Drug use: No   Sexual activity: Yes  Other Topics Concern   Not on file  Social History Narrative   Not on file   Social Determinants of Health   Financial Resource Strain: Not on file  Food Insecurity: Not on file  Transportation Needs: Not on file  Physical Activity: Not on file  Stress: Not on file  Social Connections: Not on file  Intimate Partner Violence: Not on file    Family History:  Family History  Problem Relation Age of Onset   Heart block Mother    Asthma Other    Hypertension Other    Hyperlipidemia Other    Diabetes Other     Medications:   Current Outpatient Medications on File Prior to Visit  Medication Sig Dispense Refill   Accu-Chek Softclix Lancets lancets Use to check blood sugar 1 to 2 times daily 200 each 1   acetaminophen (TYLENOL) 500 MG tablet Take 500 mg by mouth every 6 (six) hours as needed for mild pain.     Blood Glucose Monitoring Suppl (ACCU-CHEK GUIDE) w/Device KIT Use to check blood sugar 1 to 2 times daily 1 kit 0   clopidogrel (PLAVIX) 75 MG tablet Take 1 tablet (75 mg total) by mouth daily. 90 tablet 1   dextromethorphan-guaiFENesin (MUCINEX DM) 30-600 MG 12hr tablet Take 1 tablet by mouth 2 (two) times daily as needed for cough.     Dulaglutide (TRULICITY) 6.22 WL/7.9GX SOPN Inject 0.75 mg into the skin once a week. 2 mL 5   empagliflozin (JARDIANCE) 25 MG TABS tablet Take 1 tablet (25 mg total) by mouth daily. 30 tablet 5   estradiol (ESTRACE) 0.1 MG/GM vaginal cream apply a pea size amount vaginally nightly for 2 weeks, then every other day 42.5 g 6   glucose blood test strip Use to check  blood sugar 1 to 2 times  a day 200 each 1   Lancets (FREESTYLE) lancets Use as directed up to 3 times daily 300 each 3   Lancets Misc. MISC Use as directed 3 times daily 300 each 3   lisinopril-hydrochlorothiazide (ZESTORETIC) 20-12.5 MG tablet Take 1 tablet by mouth daily. 90 tablet 3   metFORMIN (GLUCOPHAGE) 1000 MG tablet Take 1 tablet (1,000 mg total) by mouth 2 (two) times daily. 180 tablet 3   metoprolol tartrate (LOPRESSOR) 25 MG tablet Take 0.5 tablets (12.5 mg total) by mouth 2 (two) times daily with a meal. 90 tablet 3   Multiple Vitamins-Calcium (ONE-A-DAY WOMENS PO) Take 1 tablet by mouth daily.     rosuvastatin (CRESTOR) 10 MG tablet Take 1 tablet (10 mg total) by mouth daily. 30 tablet 12   sitaGLIPtin (JANUVIA) 100 MG  tablet Take 1 tablet (100 mg total) by mouth daily. 30 tablet 5   prednisoLONE acetate (PRED FORTE) 1 % ophthalmic suspension Place 1 drop into the left eye 4 (four) times daily. (Patient not taking: Reported on 06/21/2021) 5 mL 0   triamcinolone cream (KENALOG) 0.1 % Apply 1 application topically 2 (two) times daily as needed (for itching). (Patient not taking: Reported on 06/21/2021)     No current facility-administered medications on file prior to visit.    Allergies:   Allergies  Allergen Reactions   Pioglitazone Other (See Comments)    Headaches, myalgias, chest pain, and dizziness   Canagliflozin Palpitations   Statins Nausea Only      OBJECTIVE:  Physical Exam  Vitals:   06/21/21 0845  BP: 130/74  Pulse: 78  Weight: 174 lb (78.9 kg)  Height: _0  (1.6 m)   Body mass index is 30.82 kg/m. No results found.   General: well developed, well nourished, very pleasant middle-aged African-American female, seated, in no evident distress Head: head normocephalic and atraumatic.   Neck: supple with no carotid or supraclavicular bruits Cardiovascular: regular rate and rhythm, no murmurs Musculoskeletal: no deformity Skin:  no  rash/petichiae Vascular:  Normal pulses all extremities   Neurologic Exam Mental Status: Awake and fully alert.  Fluent speech and language.  Oriented to place and time. Recent and remote memory intact. Attention span, concentration and fund of knowledge appropriate. Mood and affect appropriate.  Cranial Nerves: Pupils equal, briskly reactive to light. Extraocular movements full without nystagmus. Visual fields full to confrontation. Hearing intact. Facial sensation intact. Face, tongue, palate moves normally and symmetrically.  Motor: Normal bulk and tone. Normal strength in all tested extremity muscles Sensory.: intact to touch , pinprick , position and vibratory sensation.  Coordination: Rapid alternating movements normal in all extremities. Finger-to-nose and heel-to-shin performed accurately bilaterally. Gait and Station: Arises from chair without difficulty. Stance is normal. Gait demonstrates normal stride length and balance without use of assistive device. Tandem walk and heel toe with mild difficulty.  Reflexes: 1+ and symmetric. Toes downgoing.         ASSESSMENT: Brittany [name] is a 66 y.o. 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  Vascular risk factors include multiple prior strokes on imaging (left cerebellum, left thalamic bilateral BG including infarcts), HTN, HLD, DM, CHF and advanced age.      PLAN:  Left pontine stroke:  Recovered well without residual deficit Continue clopidogrel 75 mg daily  and Crestor 10 mg daily for secondary stroke prevention.  Advised refills managed by PCP as these will be life long medications Discussed secondary stroke prevention measures and importance of close PCP follow up for aggressive stroke risk factor management including BP goal<130/90, HLD with LDL goal<70 and DM with A1c.<7.  Stroke labs routinely monitored by PCP  -unable to view via epic - per patient, labs have been satisfactory I have gone over the pathophysiology of stroke, warning signs and symptoms, risk factors and their management in some detail with instructions to go to the closest emergency room for symptoms of concern.  Cerebral aneurysm:  Plan on repeat around 01/2022 CTA 01/2021: Stable appearance of left ICA outpouching CTA 07/2020 12m outpouching L ICA representing small aneurysm vs infundibulum. Repeat CTA head/neck around 01/2021 for repeat imaging/surveillance monitoring  No fm hx. BP stable.  Remote tobacco use - quite early 2000s.  Doing well from stroke standpoint without further recommendations and risk factors are managed by PCP. Will plan on repeat imaging as noted above, if ongoing monitoring if needed, will contact patient to schedule follow up visit.  Until that time, she may follow up PRN, as usual for our patients who are strictly being followed for stroke. If any new neurological issues should arise, request PCP place referral for evaluation by one of our neurologists. Thank you.     CC:  PCP: Alroy Dust, L.Marlou Sa, MD    I spent 31 minutes of face-to-face and non-face-to-face time with patient.  This included previsit chart review, lab review, study review, electronic health record documentation, patient education regarding prior stroke and excellent recovery, secondary stroke prevention measures and importance of managing stroke risk factors, residual deficits, cerebral aneurysm with further education and indication for repeat imaging, and answered all other questions to patient satisfaction   Frann Rider, [facility]  [facility] [address] [place], [place] [address]  Phone [phone] Fax [phone] Note: This document was prepared with digital dictation and possible smart phrase technology. Any transcriptional errors that result from this process are unintentional.

## 2021-06-29 ENCOUNTER — Other Ambulatory Visit (HOSPITAL_COMMUNITY): Payer: Self-pay

## 2021-07-01 DIAGNOSIS — Z8673 Personal history of transient ischemic attack (TIA), and cerebral infarction without residual deficits: Secondary | ICD-10-CM | POA: Diagnosis not present

## 2021-07-01 DIAGNOSIS — E7800 Pure hypercholesterolemia, unspecified: Secondary | ICD-10-CM | POA: Diagnosis not present

## 2021-07-01 DIAGNOSIS — E119 Type 2 diabetes mellitus without complications: Secondary | ICD-10-CM | POA: Diagnosis not present

## 2021-07-01 DIAGNOSIS — I10 Essential (primary) hypertension: Secondary | ICD-10-CM | POA: Diagnosis not present

## 2021-07-10 ENCOUNTER — Other Ambulatory Visit (HOSPITAL_COMMUNITY): Payer: Self-pay

## 2021-07-12 ENCOUNTER — Other Ambulatory Visit (HOSPITAL_COMMUNITY): Payer: Self-pay

## 2021-07-12 MED ORDER — ROSUVASTATIN CALCIUM 10 MG PO TABS
10.0000 mg | ORAL_TABLET | Freq: Every day | ORAL | 5 refills | Status: DC
  Filled 2021-07-12: qty 30, 30d supply, fill #0
  Filled 2021-08-07: qty 30, 30d supply, fill #1
  Filled 2021-09-07: qty 30, 30d supply, fill #2
  Filled 2021-10-08: qty 30, 30d supply, fill #3
  Filled 2021-11-08: qty 30, 30d supply, fill #4
  Filled 2021-12-13: qty 30, 30d supply, fill #5

## 2021-07-12 MED ORDER — JANUVIA 100 MG PO TABS
100.0000 mg | ORAL_TABLET | Freq: Every day | ORAL | 5 refills | Status: DC
  Filled 2021-07-12: qty 30, 30d supply, fill #0
  Filled 2021-08-07: qty 30, 30d supply, fill #1
  Filled 2021-09-07: qty 30, 30d supply, fill #2
  Filled 2021-10-08: qty 30, 30d supply, fill #3
  Filled 2021-11-08: qty 30, 30d supply, fill #4
  Filled 2021-12-13: qty 30, 30d supply, fill #5

## 2021-07-20 ENCOUNTER — Ambulatory Visit
Admission: RE | Admit: 2021-07-20 | Discharge: 2021-07-20 | Disposition: A | Discharge status: Home / Self Care | Payer: Medicare Other | Source: Ambulatory Visit | Attending: Family Medicine | Admitting: Family Medicine

## 2021-07-20 DIAGNOSIS — E2839 Other primary ovarian failure: Secondary | ICD-10-CM

## 2021-07-20 DIAGNOSIS — Z780 Asymptomatic menopausal state: Secondary | ICD-10-CM | POA: Diagnosis not present

## 2021-07-26 ENCOUNTER — Other Ambulatory Visit (HOSPITAL_COMMUNITY): Payer: Self-pay

## 2021-08-07 ENCOUNTER — Other Ambulatory Visit (HOSPITAL_COMMUNITY): Payer: Self-pay

## 2021-08-09 ENCOUNTER — Other Ambulatory Visit (HOSPITAL_COMMUNITY): Payer: Self-pay

## 2021-08-09 MED ORDER — LISINOPRIL-HYDROCHLOROTHIAZIDE 20-12.5 MG PO TABS
1.0000 | ORAL_TABLET | Freq: Every day | ORAL | 3 refills | Status: DC
Start: 2021-08-09 — End: 2022-08-03
  Filled 2021-08-09: qty 90, 90d supply, fill #0
  Filled 2021-11-08: qty 90, 90d supply, fill #1
  Filled 2022-02-07: qty 90, 90d supply, fill #2
  Filled 2022-05-03: qty 90, 90d supply, fill #3

## 2021-08-09 MED ORDER — JARDIANCE 25 MG PO TABS
25.0000 mg | ORAL_TABLET | Freq: Every day | ORAL | 5 refills | Status: DC
  Filled 2021-08-09: qty 30, 30d supply, fill #0
  Filled 2021-09-07: qty 30, 30d supply, fill #1
  Filled 2021-10-08: qty 30, 30d supply, fill #2
  Filled 2021-11-08: qty 30, 30d supply, fill #3
  Filled 2021-12-13: qty 30, 30d supply, fill #4
  Filled 2022-01-11: qty 30, 30d supply, fill #5

## 2021-08-11 ENCOUNTER — Other Ambulatory Visit (HOSPITAL_COMMUNITY): Payer: Self-pay

## 2021-08-22 ENCOUNTER — Other Ambulatory Visit (HOSPITAL_COMMUNITY): Payer: Self-pay

## 2021-08-23 ENCOUNTER — Other Ambulatory Visit (HOSPITAL_COMMUNITY): Payer: Self-pay

## 2021-08-24 ENCOUNTER — Other Ambulatory Visit (HOSPITAL_COMMUNITY): Payer: Self-pay

## 2021-08-24 MED ORDER — TRULICITY 0.75 MG/0.5ML ~~LOC~~ SOAJ
0.7500 mg | SUBCUTANEOUS | 5 refills | Status: AC
  Filled 2021-08-24: qty 2, 28d supply, fill #0
  Filled 2021-11-16: qty 2, 28d supply, fill #1
  Filled 2021-12-12: qty 2, 28d supply, fill #2
  Filled 2022-01-11: qty 2, 28d supply, fill #3
  Filled 2022-02-07: qty 2, 28d supply, fill #4
  Filled 2022-03-09 – 2022-03-10 (×2): qty 2, 28d supply, fill #5

## 2021-08-30 DIAGNOSIS — Z4689 Encounter for fitting and adjustment of other specified devices: Secondary | ICD-10-CM | POA: Diagnosis not present

## 2021-08-30 DIAGNOSIS — E119 Type 2 diabetes mellitus without complications: Secondary | ICD-10-CM | POA: Diagnosis not present

## 2021-09-07 ENCOUNTER — Other Ambulatory Visit (HOSPITAL_COMMUNITY): Payer: Self-pay

## 2021-09-21 ENCOUNTER — Other Ambulatory Visit (HOSPITAL_COMMUNITY): Payer: Self-pay

## 2021-09-24 ENCOUNTER — Other Ambulatory Visit (HOSPITAL_COMMUNITY): Payer: Self-pay

## 2021-09-24 MED ORDER — TRULICITY 0.75 MG/0.5ML ~~LOC~~ SOAJ
0.7500 mg | SUBCUTANEOUS | 1 refills | Status: AC
  Filled 2021-09-24: qty 2, 28d supply, fill #0
  Filled 2021-10-18: qty 2, 28d supply, fill #1

## 2021-10-05 ENCOUNTER — Other Ambulatory Visit (HOSPITAL_COMMUNITY): Payer: Self-pay

## 2021-10-07 DIAGNOSIS — Z789 Other specified health status: Secondary | ICD-10-CM | POA: Diagnosis not present

## 2021-10-08 ENCOUNTER — Other Ambulatory Visit (HOSPITAL_COMMUNITY): Payer: Self-pay

## 2021-10-09 DIAGNOSIS — Z23 Encounter for immunization: Secondary | ICD-10-CM | POA: Diagnosis not present

## 2021-10-11 ENCOUNTER — Other Ambulatory Visit (HOSPITAL_COMMUNITY): Payer: Self-pay

## 2021-10-11 MED ORDER — CLOPIDOGREL BISULFATE 75 MG PO TABS
75.0000 mg | ORAL_TABLET | Freq: Every day | ORAL | 1 refills | Status: AC
  Filled 2021-10-11: qty 90, 90d supply, fill #0
  Filled 2022-01-11: qty 90, 90d supply, fill #1

## 2021-10-12 ENCOUNTER — Other Ambulatory Visit (HOSPITAL_COMMUNITY): Payer: Self-pay

## 2021-10-18 ENCOUNTER — Other Ambulatory Visit (HOSPITAL_COMMUNITY): Payer: Self-pay

## 2021-10-18 MED ORDER — METOPROLOL TARTRATE 25 MG PO TABS
12.5000 mg | ORAL_TABLET | Freq: Two times a day (BID) | ORAL | 1 refills | Status: DC
  Filled 2021-10-18: qty 90, 90d supply, fill #0
  Filled 2022-02-07: qty 90, 90d supply, fill #1

## 2021-10-25 DIAGNOSIS — Z01818 Encounter for other preprocedural examination: Secondary | ICD-10-CM | POA: Diagnosis not present

## 2021-10-25 DIAGNOSIS — R81 Glycosuria: Secondary | ICD-10-CM | POA: Diagnosis not present

## 2021-10-25 DIAGNOSIS — R82998 Other abnormal findings in urine: Secondary | ICD-10-CM | POA: Diagnosis not present

## 2021-11-02 ENCOUNTER — Other Ambulatory Visit (HOSPITAL_COMMUNITY): Payer: Self-pay

## 2021-11-02 DIAGNOSIS — I110 Hypertensive heart disease with heart failure: Secondary | ICD-10-CM | POA: Diagnosis not present

## 2021-11-02 DIAGNOSIS — Z8673 Personal history of transient ischemic attack (TIA), and cerebral infarction without residual deficits: Secondary | ICD-10-CM | POA: Diagnosis not present

## 2021-11-02 DIAGNOSIS — Z7984 Long term (current) use of oral hypoglycemic drugs: Secondary | ICD-10-CM | POA: Diagnosis not present

## 2021-11-02 DIAGNOSIS — Z79899 Other long term (current) drug therapy: Secondary | ICD-10-CM | POA: Diagnosis not present

## 2021-11-02 DIAGNOSIS — Z7982 Long term (current) use of aspirin: Secondary | ICD-10-CM | POA: Diagnosis not present

## 2021-11-02 DIAGNOSIS — Z7902 Long term (current) use of antithrombotics/antiplatelets: Secondary | ICD-10-CM | POA: Diagnosis not present

## 2021-11-02 DIAGNOSIS — E119 Type 2 diabetes mellitus without complications: Secondary | ICD-10-CM | POA: Diagnosis not present

## 2021-11-02 DIAGNOSIS — E785 Hyperlipidemia, unspecified: Secondary | ICD-10-CM | POA: Diagnosis not present

## 2021-11-02 DIAGNOSIS — Z87891 Personal history of nicotine dependence: Secondary | ICD-10-CM | POA: Diagnosis not present

## 2021-11-02 DIAGNOSIS — I5032 Chronic diastolic (congestive) heart failure: Secondary | ICD-10-CM | POA: Diagnosis not present

## 2021-11-02 MED ORDER — ACETAMINOPHEN 325 MG PO TABS
650.0000 mg | ORAL_TABLET | ORAL | 0 refills | Status: AC | PRN
  Filled 2021-11-02: qty 60, 5d supply, fill #0

## 2021-11-02 MED ORDER — POLYETHYLENE GLYCOL 3350 17 G PO PACK
17.0000 g | PACK | Freq: Every day | ORAL | 0 refills | Status: AC
  Filled 2021-11-02: qty 10, 10d supply, fill #0

## 2021-11-02 MED ORDER — TRAMADOL HCL 50 MG PO TABS
50.0000 mg | ORAL_TABLET | Freq: Four times a day (QID) | ORAL | 0 refills | Status: AC | PRN
  Filled 2021-11-02: qty 20, 5d supply, fill #0

## 2021-11-09 ENCOUNTER — Other Ambulatory Visit (HOSPITAL_COMMUNITY): Payer: Self-pay

## 2021-11-16 ENCOUNTER — Other Ambulatory Visit (HOSPITAL_COMMUNITY): Payer: Self-pay

## 2021-11-19 ENCOUNTER — Other Ambulatory Visit (HOSPITAL_COMMUNITY): Payer: Self-pay

## 2021-11-19 MED ORDER — METFORMIN HCL 1000 MG PO TABS
1000.0000 mg | ORAL_TABLET | Freq: Two times a day (BID) | ORAL | 1 refills | Status: DC
  Filled 2021-11-19: qty 180, 90d supply, fill #0
  Filled 2022-02-21: qty 180, 90d supply, fill #1

## 2021-12-08 ENCOUNTER — Other Ambulatory Visit: Payer: Self-pay | Admitting: Family Medicine

## 2021-12-08 DIAGNOSIS — Z1231 Encounter for screening mammogram for malignant neoplasm of breast: Secondary | ICD-10-CM

## 2021-12-13 ENCOUNTER — Other Ambulatory Visit (HOSPITAL_COMMUNITY): Payer: Self-pay

## 2021-12-17 ENCOUNTER — Ambulatory Visit
Admission: RE | Admit: 2021-12-17 | Discharge: 2021-12-17 | Disposition: A | Discharge status: Home / Self Care | Payer: Medicare Other | Source: Ambulatory Visit | Attending: Family Medicine | Admitting: Family Medicine

## 2021-12-17 DIAGNOSIS — Z1231 Encounter for screening mammogram for malignant neoplasm of breast: Secondary | ICD-10-CM | POA: Diagnosis not present

## 2021-12-25 ENCOUNTER — Other Ambulatory Visit (HOSPITAL_COMMUNITY): Payer: Self-pay

## 2021-12-27 ENCOUNTER — Other Ambulatory Visit (HOSPITAL_COMMUNITY): Payer: Self-pay

## 2021-12-27 MED ORDER — ACCU-CHEK SOFTCLIX LANCETS MISC
Freq: Two times a day (BID) | 1 refills | Status: AC
  Filled 2021-12-27: qty 200, 100d supply, fill #0

## 2021-12-28 ENCOUNTER — Other Ambulatory Visit (HOSPITAL_COMMUNITY): Payer: Self-pay

## 2021-12-31 ENCOUNTER — Other Ambulatory Visit (HOSPITAL_COMMUNITY): Payer: Self-pay

## 2021-12-31 MED ORDER — GLUCOSE BLOOD VI STRP
ORAL_STRIP | Freq: Two times a day (BID) | 1 refills | Status: AC
  Filled 2021-12-31: qty 200, 100d supply, fill #0

## 2022-01-04 ENCOUNTER — Other Ambulatory Visit: Payer: Self-pay | Admitting: Adult Health

## 2022-01-04 ENCOUNTER — Telehealth: Payer: Self-pay | Admitting: Adult Health

## 2022-01-04 DIAGNOSIS — I671 Cerebral aneurysm, nonruptured: Secondary | ICD-10-CM

## 2022-01-04 NOTE — Telephone Encounter (Signed)
[employer] medicare NPR sent to GI [phone] 

## 2022-01-11 ENCOUNTER — Other Ambulatory Visit (HOSPITAL_COMMUNITY): Payer: Self-pay

## 2022-01-11 MED ORDER — JANUVIA 100 MG PO TABS
100.0000 mg | ORAL_TABLET | Freq: Every day | ORAL | 5 refills | Status: DC
  Filled 2022-01-11: qty 30, 30d supply, fill #0
  Filled 2022-02-17: qty 30, 30d supply, fill #1
  Filled 2022-05-29 – 2022-07-14 (×2): qty 30, 30d supply, fill #2
  Filled 2022-08-15: qty 30, 30d supply, fill #3
  Filled 2022-09-16: qty 30, 30d supply, fill #4
  Filled 2022-10-19: qty 30, 30d supply, fill #5

## 2022-01-11 MED ORDER — ROSUVASTATIN CALCIUM 10 MG PO TABS
10.0000 mg | ORAL_TABLET | Freq: Every day | ORAL | 5 refills | Status: DC
  Filled 2022-01-11: qty 30, 30d supply, fill #0
  Filled 2022-02-07: qty 30, 30d supply, fill #1
  Filled 2022-03-26: qty 30, 30d supply, fill #2
  Filled 2022-05-03: qty 30, 30d supply, fill #3
  Filled 2022-06-12: qty 30, 30d supply, fill #4
  Filled 2022-07-25: qty 30, 30d supply, fill #5

## 2022-01-12 ENCOUNTER — Ambulatory Visit
Admission: RE | Admit: 2022-01-12 | Discharge: 2022-01-12 | Disposition: A | Discharge status: Home / Self Care | Payer: PPO | Source: Ambulatory Visit | Attending: Adult Health | Admitting: Adult Health

## 2022-01-12 ENCOUNTER — Other Ambulatory Visit (HOSPITAL_COMMUNITY): Payer: Self-pay

## 2022-01-12 DIAGNOSIS — I671 Cerebral aneurysm, nonruptured: Secondary | ICD-10-CM

## 2022-01-12 MED ORDER — IOPAMIDOL (ISOVUE-370) INJECTION 76%
75.0000 mL | Freq: Once | INTRAVENOUS | Status: AC | PRN
  Administered 2022-01-12: 75 mL via INTRAVENOUS

## 2022-01-18 NOTE — Telephone Encounter (Signed)
Noted  

## 2022-01-18 NOTE — Telephone Encounter (Signed)
Phone rep called pt and scheduled her for 12-19-22 with 7:15 check in for 7:45 appointment.  This is FYI for POD 3, no call back requested.

## 2022-01-18 NOTE — Telephone Encounter (Signed)
Please advise patient that recent CT showed stable appearance of aneurysm, there are no new concerns.  Please schedule patient for follow-up visit around 12/2022 for plan on repeat imaging around 01/2023.  Thank you.

## 2022-02-08 ENCOUNTER — Other Ambulatory Visit (HOSPITAL_COMMUNITY): Payer: Self-pay

## 2022-02-08 MED ORDER — JARDIANCE 25 MG PO TABS
25.0000 mg | ORAL_TABLET | Freq: Every morning | ORAL | 1 refills | Status: DC
  Filled 2022-02-08: qty 90, 90d supply, fill #0
  Filled 2022-05-23 – 2022-05-27 (×2): qty 30, 30d supply, fill #1
  Filled 2022-06-29: qty 30, 30d supply, fill #2
  Filled 2022-08-02: qty 30, 30d supply, fill #3

## 2022-02-18 ENCOUNTER — Other Ambulatory Visit: Payer: Self-pay

## 2022-02-18 ENCOUNTER — Other Ambulatory Visit (HOSPITAL_COMMUNITY): Payer: Self-pay

## 2022-02-19 DIAGNOSIS — I6523 Occlusion and stenosis of bilateral carotid arteries: Secondary | ICD-10-CM | POA: Diagnosis not present

## 2022-02-19 DIAGNOSIS — I10 Essential (primary) hypertension: Secondary | ICD-10-CM | POA: Diagnosis not present

## 2022-02-19 DIAGNOSIS — Z87891 Personal history of nicotine dependence: Secondary | ICD-10-CM | POA: Diagnosis not present

## 2022-02-19 DIAGNOSIS — E1169 Type 2 diabetes mellitus with other specified complication: Secondary | ICD-10-CM | POA: Diagnosis not present

## 2022-02-19 DIAGNOSIS — E1165 Type 2 diabetes mellitus with hyperglycemia: Secondary | ICD-10-CM | POA: Diagnosis not present

## 2022-02-19 DIAGNOSIS — E663 Overweight: Secondary | ICD-10-CM | POA: Diagnosis not present

## 2022-02-19 DIAGNOSIS — E785 Hyperlipidemia, unspecified: Secondary | ICD-10-CM | POA: Diagnosis not present

## 2022-02-19 DIAGNOSIS — Z8673 Personal history of transient ischemic attack (TIA), and cerebral infarction without residual deficits: Secondary | ICD-10-CM | POA: Diagnosis not present

## 2022-02-19 DIAGNOSIS — Z7982 Long term (current) use of aspirin: Secondary | ICD-10-CM | POA: Diagnosis not present

## 2022-02-21 ENCOUNTER — Other Ambulatory Visit (HOSPITAL_COMMUNITY): Payer: Self-pay

## 2022-02-22 DIAGNOSIS — Z1159 Encounter for screening for other viral diseases: Secondary | ICD-10-CM | POA: Diagnosis not present

## 2022-03-09 ENCOUNTER — Other Ambulatory Visit (HOSPITAL_COMMUNITY): Payer: Self-pay

## 2022-03-10 ENCOUNTER — Other Ambulatory Visit (HOSPITAL_COMMUNITY): Payer: Self-pay

## 2022-04-05 ENCOUNTER — Other Ambulatory Visit (HOSPITAL_COMMUNITY): Payer: Self-pay

## 2022-04-06 ENCOUNTER — Other Ambulatory Visit (HOSPITAL_COMMUNITY): Payer: Self-pay

## 2022-04-06 MED ORDER — TRULICITY 0.75 MG/0.5ML ~~LOC~~ SOAJ
0.7500 mg | SUBCUTANEOUS | 1 refills | Status: DC
  Filled 2022-04-06 (×2): qty 2, 28d supply, fill #0
  Filled 2022-05-03: qty 2, 28d supply, fill #1
  Filled 2022-05-28: qty 2, 28d supply, fill #2
  Filled 2022-06-27: qty 2, 28d supply, fill #3
  Filled 2022-08-02: qty 2, 28d supply, fill #4
  Filled 2022-08-29: qty 2, 28d supply, fill #5

## 2022-05-02 ENCOUNTER — Telehealth: Payer: Self-pay

## 2022-05-02 NOTE — Telephone Encounter (Signed)
Received clearance forms from [facility] to stop Patient Plavix for her colonoscopy on 05/26/22. Paper was placed on Dr. Pearlean Brownie inbox.

## 2022-05-03 ENCOUNTER — Other Ambulatory Visit (HOSPITAL_COMMUNITY): Payer: Self-pay

## 2022-05-03 MED ORDER — ONETOUCH DELICA LANCING DEV MISC
1 refills | Status: AC
  Filled 2022-05-03 – 2022-12-14 (×2): qty 1, 90d supply, fill #0

## 2022-05-03 MED ORDER — ONETOUCH DELICA LANCETS 33G MISC
3 refills | Status: DC
Start: 2022-05-03 — End: 2023-01-05
  Filled 2022-05-03: qty 100, 50d supply, fill #0
  Filled 2022-06-27: qty 100, 50d supply, fill #1
  Filled 2022-08-20: qty 100, 50d supply, fill #2
  Filled 2022-10-19: qty 100, 50d supply, fill #3

## 2022-05-03 MED ORDER — GLUCOSE BLOOD VI STRP
ORAL_STRIP | 3 refills | Status: DC
  Filled 2022-05-03: qty 100, 50d supply, fill #0
  Filled 2022-06-09 – 2022-06-27 (×2): qty 100, 50d supply, fill #1
  Filled 2022-08-20: qty 100, 50d supply, fill #2
  Filled 2022-10-19: qty 100, 50d supply, fill #3

## 2022-05-03 MED ORDER — ONETOUCH VERIO FLEX SYSTEM W/DEVICE KIT
PACK | 1 refills | Status: AC
  Filled 2022-05-03: qty 1, 30d supply, fill #0

## 2022-05-03 MED ORDER — CLOPIDOGREL BISULFATE 75 MG PO TABS
75.0000 mg | ORAL_TABLET | Freq: Every day | ORAL | 1 refills | Status: DC
  Filled 2022-05-03: qty 90, 90d supply, fill #0
  Filled 2022-08-02: qty 90, 90d supply, fill #1

## 2022-05-05 ENCOUNTER — Other Ambulatory Visit (HOSPITAL_COMMUNITY): Payer: Self-pay

## 2022-05-06 ENCOUNTER — Other Ambulatory Visit (HOSPITAL_COMMUNITY): Payer: Self-pay

## 2022-05-19 ENCOUNTER — Other Ambulatory Visit (HOSPITAL_COMMUNITY): Payer: Self-pay

## 2022-05-19 MED ORDER — PEG-3350/ELECTROLYTES 236 G PO SOLR
ORAL | 0 refills | Status: AC
  Filled 2022-05-19: qty 4000, 1d supply, fill #0

## 2022-05-23 ENCOUNTER — Other Ambulatory Visit (HOSPITAL_COMMUNITY): Payer: Self-pay

## 2022-05-27 ENCOUNTER — Other Ambulatory Visit (HOSPITAL_COMMUNITY): Payer: Self-pay

## 2022-05-30 ENCOUNTER — Other Ambulatory Visit (HOSPITAL_COMMUNITY): Payer: Self-pay

## 2022-06-01 ENCOUNTER — Other Ambulatory Visit (HOSPITAL_COMMUNITY): Payer: Self-pay

## 2022-06-09 ENCOUNTER — Other Ambulatory Visit (HOSPITAL_COMMUNITY): Payer: Self-pay

## 2022-06-15 ENCOUNTER — Other Ambulatory Visit (HOSPITAL_COMMUNITY): Payer: Self-pay

## 2022-06-20 DIAGNOSIS — N814 Uterovaginal prolapse, unspecified: Secondary | ICD-10-CM | POA: Diagnosis not present

## 2022-06-27 ENCOUNTER — Other Ambulatory Visit: Payer: Self-pay

## 2022-06-27 ENCOUNTER — Other Ambulatory Visit (HOSPITAL_COMMUNITY): Payer: Self-pay

## 2022-06-30 ENCOUNTER — Other Ambulatory Visit (HOSPITAL_COMMUNITY): Payer: Self-pay

## 2022-07-05 DIAGNOSIS — Z86010 Personal history of colonic polyps: Secondary | ICD-10-CM | POA: Diagnosis not present

## 2022-07-05 DIAGNOSIS — K621 Rectal polyp: Secondary | ICD-10-CM | POA: Diagnosis not present

## 2022-07-05 DIAGNOSIS — Z09 Encounter for follow-up examination after completed treatment for conditions other than malignant neoplasm: Secondary | ICD-10-CM | POA: Diagnosis not present

## 2022-07-05 DIAGNOSIS — K648 Other hemorrhoids: Secondary | ICD-10-CM | POA: Diagnosis not present

## 2022-07-05 DIAGNOSIS — K5730 Diverticulosis of large intestine without perforation or abscess without bleeding: Secondary | ICD-10-CM | POA: Diagnosis not present

## 2022-07-05 DIAGNOSIS — K635 Polyp of colon: Secondary | ICD-10-CM | POA: Diagnosis not present

## 2022-07-07 DIAGNOSIS — K621 Rectal polyp: Secondary | ICD-10-CM | POA: Diagnosis not present

## 2022-07-07 DIAGNOSIS — K635 Polyp of colon: Secondary | ICD-10-CM | POA: Diagnosis not present

## 2022-07-15 ENCOUNTER — Other Ambulatory Visit (HOSPITAL_COMMUNITY): Payer: Self-pay

## 2022-07-20 ENCOUNTER — Other Ambulatory Visit (HOSPITAL_COMMUNITY): Payer: Self-pay

## 2022-07-20 MED ORDER — AMOXICILLIN 500 MG PO CAPS
500.0000 mg | ORAL_CAPSULE | Freq: Three times a day (TID) | ORAL | 0 refills | Status: AC
  Filled 2022-07-20: qty 21, 7d supply, fill #0

## 2022-07-25 ENCOUNTER — Other Ambulatory Visit: Payer: Self-pay

## 2022-07-25 ENCOUNTER — Other Ambulatory Visit (HOSPITAL_COMMUNITY): Payer: Self-pay

## 2022-07-25 MED ORDER — METOPROLOL TARTRATE 25 MG PO TABS
12.5000 mg | ORAL_TABLET | Freq: Two times a day (BID) | ORAL | 0 refills | Status: DC
  Filled 2022-07-25: qty 90, 90d supply, fill #0

## 2022-07-25 MED ORDER — METFORMIN HCL 1000 MG PO TABS
1000.0000 mg | ORAL_TABLET | Freq: Two times a day (BID) | ORAL | 0 refills | Status: AC
  Filled 2022-07-25: qty 180, 90d supply, fill #0

## 2022-07-26 ENCOUNTER — Other Ambulatory Visit (HOSPITAL_COMMUNITY): Payer: Self-pay

## 2022-08-02 ENCOUNTER — Other Ambulatory Visit (HOSPITAL_COMMUNITY): Payer: Self-pay

## 2022-08-03 ENCOUNTER — Other Ambulatory Visit (HOSPITAL_COMMUNITY): Payer: Self-pay

## 2022-08-03 MED ORDER — LISINOPRIL-HYDROCHLOROTHIAZIDE 20-12.5 MG PO TABS
1.0000 | ORAL_TABLET | Freq: Every day | ORAL | 3 refills | Status: DC
  Filled 2022-08-03: qty 90, 90d supply, fill #0
  Filled 2022-11-03: qty 90, 90d supply, fill #1
  Filled 2023-02-05: qty 90, 90d supply, fill #2
  Filled 2023-05-07: qty 90, 90d supply, fill #3

## 2022-08-22 ENCOUNTER — Other Ambulatory Visit (HOSPITAL_COMMUNITY): Payer: Self-pay

## 2022-08-22 ENCOUNTER — Other Ambulatory Visit: Payer: Self-pay

## 2022-08-29 ENCOUNTER — Other Ambulatory Visit (HOSPITAL_COMMUNITY): Payer: Self-pay

## 2022-08-29 MED ORDER — ROSUVASTATIN CALCIUM 10 MG PO TABS
10.0000 mg | ORAL_TABLET | Freq: Every day | ORAL | 1 refills | Status: DC
  Filled 2022-08-29: qty 90, 90d supply, fill #0
  Filled 2022-11-16: qty 90, 90d supply, fill #1

## 2022-09-01 DIAGNOSIS — E7800 Pure hypercholesterolemia, unspecified: Secondary | ICD-10-CM | POA: Diagnosis not present

## 2022-09-01 DIAGNOSIS — Z8673 Personal history of transient ischemic attack (TIA), and cerebral infarction without residual deficits: Secondary | ICD-10-CM | POA: Diagnosis not present

## 2022-09-01 DIAGNOSIS — E1169 Type 2 diabetes mellitus with other specified complication: Secondary | ICD-10-CM | POA: Diagnosis not present

## 2022-09-01 DIAGNOSIS — R413 Other amnesia: Secondary | ICD-10-CM | POA: Diagnosis not present

## 2022-09-01 DIAGNOSIS — F5101 Primary insomnia: Secondary | ICD-10-CM | POA: Diagnosis not present

## 2022-09-01 DIAGNOSIS — I10 Essential (primary) hypertension: Secondary | ICD-10-CM | POA: Diagnosis not present

## 2022-09-01 DIAGNOSIS — E119 Type 2 diabetes mellitus without complications: Secondary | ICD-10-CM | POA: Diagnosis not present

## 2022-09-01 DIAGNOSIS — N8110 Cystocele, unspecified: Secondary | ICD-10-CM | POA: Diagnosis not present

## 2022-09-04 ENCOUNTER — Other Ambulatory Visit (HOSPITAL_COMMUNITY): Payer: Self-pay

## 2022-09-05 ENCOUNTER — Other Ambulatory Visit (HOSPITAL_COMMUNITY): Payer: Self-pay

## 2022-09-05 MED ORDER — JARDIANCE 25 MG PO TABS
25.0000 mg | ORAL_TABLET | Freq: Every morning | ORAL | 3 refills | Status: DC
  Filled 2022-09-05: qty 90, 90d supply, fill #0
  Filled 2022-12-11: qty 90, 90d supply, fill #1
  Filled 2023-03-13: qty 90, 90d supply, fill #2
  Filled 2023-06-02: qty 90, 90d supply, fill #3

## 2022-09-24 DIAGNOSIS — Z23 Encounter for immunization: Secondary | ICD-10-CM | POA: Diagnosis not present

## 2022-09-25 ENCOUNTER — Other Ambulatory Visit (HOSPITAL_COMMUNITY): Payer: Self-pay

## 2022-09-26 ENCOUNTER — Other Ambulatory Visit (HOSPITAL_COMMUNITY): Payer: Self-pay

## 2022-09-26 MED ORDER — TRULICITY 0.75 MG/0.5ML ~~LOC~~ SOAJ
0.7500 mg | SUBCUTANEOUS | 3 refills | Status: AC
Start: 2022-09-26 — End: ?
  Filled 2022-09-26: qty 6, 84d supply, fill #0
  Filled 2022-12-14: qty 6, 84d supply, fill #1
  Filled 2023-03-13: qty 2, 28d supply, fill #2
  Filled 2023-03-15: qty 6, 84d supply, fill #2
  Filled 2023-06-02: qty 6, 84d supply, fill #3

## 2022-10-06 DIAGNOSIS — R413 Other amnesia: Secondary | ICD-10-CM | POA: Diagnosis not present

## 2022-10-06 DIAGNOSIS — Z8673 Personal history of transient ischemic attack (TIA), and cerebral infarction without residual deficits: Secondary | ICD-10-CM | POA: Diagnosis not present

## 2022-10-20 ENCOUNTER — Other Ambulatory Visit (HOSPITAL_COMMUNITY): Payer: Self-pay

## 2022-10-20 ENCOUNTER — Other Ambulatory Visit: Payer: Self-pay

## 2022-10-25 ENCOUNTER — Other Ambulatory Visit (HOSPITAL_COMMUNITY): Payer: Self-pay

## 2022-10-25 MED ORDER — TRULICITY 0.75 MG/0.5ML ~~LOC~~ SOAJ: 0.7500 mg | SUBCUTANEOUS | 3 refills | Status: AC

## 2022-11-03 ENCOUNTER — Other Ambulatory Visit (HOSPITAL_COMMUNITY): Payer: Self-pay

## 2022-11-04 ENCOUNTER — Other Ambulatory Visit (HOSPITAL_COMMUNITY): Payer: Self-pay

## 2022-11-04 MED ORDER — CLOPIDOGREL BISULFATE 75 MG PO TABS
75.0000 mg | ORAL_TABLET | Freq: Every day | ORAL | 1 refills | Status: DC
  Filled 2022-11-04: qty 90, 90d supply, fill #0
  Filled 2023-02-05: qty 90, 90d supply, fill #1

## 2022-11-07 ENCOUNTER — Other Ambulatory Visit (HOSPITAL_COMMUNITY): Payer: Self-pay

## 2022-11-14 ENCOUNTER — Other Ambulatory Visit: Payer: Self-pay | Admitting: Internal Medicine

## 2022-11-14 DIAGNOSIS — Z0000 Encounter for general adult medical examination without abnormal findings: Secondary | ICD-10-CM

## 2022-11-16 ENCOUNTER — Other Ambulatory Visit (HOSPITAL_COMMUNITY): Payer: Self-pay

## 2022-11-17 ENCOUNTER — Other Ambulatory Visit (HOSPITAL_COMMUNITY): Payer: Self-pay

## 2022-11-17 MED ORDER — JANUVIA 100 MG PO TABS
100.0000 mg | ORAL_TABLET | Freq: Every day | ORAL | 5 refills | Status: DC
  Filled 2022-11-17: qty 30, 30d supply, fill #0
  Filled 2022-12-14: qty 30, 30d supply, fill #1
  Filled 2023-01-23: qty 30, 30d supply, fill #2
  Filled 2023-02-24: qty 30, 30d supply, fill #3
  Filled 2023-03-29: qty 30, 30d supply, fill #4
  Filled 2023-04-30: qty 30, 30d supply, fill #5

## 2022-12-09 DIAGNOSIS — S161XXA Strain of muscle, fascia and tendon at neck level, initial encounter: Secondary | ICD-10-CM | POA: Diagnosis not present

## 2022-12-11 ENCOUNTER — Other Ambulatory Visit (HOSPITAL_COMMUNITY): Payer: Self-pay

## 2022-12-12 ENCOUNTER — Other Ambulatory Visit (HOSPITAL_COMMUNITY): Payer: Self-pay

## 2022-12-12 MED ORDER — ONETOUCH VERIO VI STRP
ORAL_STRIP | Freq: Two times a day (BID) | 3 refills | Status: AC
  Filled 2022-12-12: qty 100, 50d supply, fill #0
  Filled 2023-04-11: qty 100, 50d supply, fill #1
  Filled 2023-06-15: qty 100, 50d supply, fill #2
  Filled 2023-09-07: qty 100, 50d supply, fill #3

## 2022-12-14 ENCOUNTER — Other Ambulatory Visit (HOSPITAL_COMMUNITY): Payer: Self-pay

## 2022-12-15 ENCOUNTER — Other Ambulatory Visit: Payer: Self-pay

## 2022-12-15 NOTE — Progress Notes (Signed)
[facility] [address] [place]. [place] [address] [phone]       STROKE FOLLOW UP NOTE  Ms. Brittany [name] Date of Birth:  [birth year] Medical Record Number:  [id number]   Primary neurologist: Dr. Pearlean Brownie Reason for Referral: stroke follow up    SUBJECTIVE:   CHIEF COMPLAINT:  Chief Complaint  Patient presents with   Follow-up    Pt in 3 Pt here for stroke f/u Pt states same since last visit Pt states urgent care  Nov 2024 Pt states neck pain. Pt states musculoskeletal strain      HPI:   Update 12/19/2022 Brittany [name]: Patient returns for follow-up visit and need of repeat imaging for possible cerebral aneurysm monitoring. She has been stable from stroke standpoint, no new stroke/TIA symptoms.  Does report mild short-term memory loss but she believes this is more age-related, gives examples that she may forget certain appointments and relies on calendar to assist, will come to appointments and can forget questions she wanted to ask, and can walk into a room and may forget why she went in there. Was seen by PCP recently who completed a memory test which was normal (per patient, unable to view via epic). Able to maintain ADLs and IADLs independently.  Compliant on stroke prevention medications.  Blood pressure today slightly elevated compared to normal, monitors at home and typically 120s-130s, she suspects BP slightly elevated this morning due to caffeine intake.  Routinely follows with PCP for stroke risk factor management.  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 Plan to repeat next month.  Was seen at urgent care last month for right sided neck pain, was prescribed muscle relaxants and symptoms since resolved. No further questions or concerns today.    History provided for reference purposes only Update 06/21/2021 Brittany [name]: Patient returns for 53-month stroke follow-up unaccompanied.  Overall  stable without new or reoccurring stroke/TIA symptoms.  Denies residual deficits.  Maintains ADLs and IADLs independently.  Compliant on Plavix and Crestor, denies side effects.  Blood pressure today 130/74. Routinely monitors at home, typically 120s/70-80s.  Glucose routinely monitored at home which has been stable.  PCP follow-up next week, plans on completing lab work - prior labs satisfactory per patient (unable to view via epic).  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  No new concerns at this time.  Update 12/21/2020 Brittany [name]: Returns for 47-month stroke follow-up unaccompanied  Overall stable without new stroke/TIA symptoms Denies any continued right hand numbness or weakness Continues to maintain ADLs and IADLs independently.  Continues to work without difficulty.  Compliant on Plavix and Crestor -denies side effects Blood pressure today 121/81 - stable at home  Glucose levels stable at home  Has f/u in Jan with PCP with plans on repeat lab work  No new concerns at this time  Initial visit 08/25/2020 Brittany [name]: Brittany [name] is being seen for hospital follow-up unaccompanied.  Returned to ED 07/21/2020 for dizziness and feeling of unsteadiness with MR brain negative for new stroke and lab work unremarkable.  She has since been doing well without residual imbalance, dizziness or unsteadiness. Initially had issues with right hand weakness and occasional numbness/tingling more noticeable with writing but has since greatly improved with only intermittent tingling sensation.  She plans on returning back to work this week at [employer].  Denies new stroke/TIA symptoms.  Completed 3 weeks DAPT and  remains on Plavix as well as Crestor without side effects.  Blood pressure today 126/77. Glucose levels 120s-130s.  No further concerns at this time.  Stroke admission 07/19/2020 Brittany [name] is a 67 y.o. female with history of hypertension,  hyperlipidemia, diabetes, CHF admitted on 07/19/2020 for lightheadedness and imbalance. No tPA given due to outside window.  Personally reviewed hospitalization pertinent progress notes, lab work and imaging.  Evaluated by Dr. Roda Shutters for left pontine infarct likely secondary to small vessel disease. MRI also showed old left cerebellum, left thalamic and bilateral BG lacunar infarcts.  CTA head/neck unremarkable.  EF 55 to 60%.  LDL 62.  A1c 6.8.  On aspirin PTA -recommended DAPT for 3 weeks and Plavix alone as well as continuation of Crestor 10 mg daily.  Other stroke risk factors include HTN, CHF, advanced age and prior strokes on imaging.  PT/OT no recommendations    PERTINENT IMAGING  Per hospitalization 07/2020 CT no acute abnormality CTA head and neck no evidence of stenosis, 1mm L ICA small aneurysm vs infundibulum  MRI left pontine infarct, old left cerebellum, left thalamic, bilateral BG lacunar infarcts 2D Echo EF 55 to 60% LDL 62 HgbA1c 6.8      ROS:   14 system review of systems performed and negative with exception of those listed in HPI  PMH:  Past Medical History:  Diagnosis Date   Diabetes mellitus without complication (HCC)    Hyperlipidemia    Hypertension    Stroke (HCC)     PSH: History reviewed. No pertinent surgical history.  Social History:  Social History   Socioeconomic History   Marital status: Married    Spouse name: Not on file   Number of children: Not on file   Years of education: Not on file   Highest education level: Not on file  Occupational History   Not on file  Tobacco Use   Smoking status: Former   Smokeless tobacco: Never  Substance and Sexual Activity   Alcohol use: No    Alcohol/week: 0.0 standard drinks of alcohol   Drug use: No   Sexual activity: Yes  Other Topics Concern   Not on file  Social History Narrative   Pt lives with husband Pt retired    International aid/development worker of [employer] Strain: Not on file  Food  Insecurity: Not on file  Transportation Needs: Not on file  Physical Activity: Not on file  Stress: Not on file  Social Connections: Not on file  Intimate Partner Violence: Not on file    Family History:  Family History  Problem Relation Age of Onset   Heart block Mother    Asthma Other    Hypertension Other    Hyperlipidemia Other    Diabetes Other     Medications:   Current Outpatient Medications on File Prior to Visit  Medication Sig Dispense Refill   Accu-Chek Softclix Lancets lancets Use to check blood sugar 1-2 (two) times daily. 200 each 1   acetaminophen (TYLENOL) 325 MG tablet Take 2 tablets (650 mg total) by mouth every 4 (four) hours as needed for up to 7 days. (Patient taking differently: Take 650 mg by mouth as needed.) 60 tablet 0   acetaminophen (TYLENOL) 500 MG tablet Take 500 mg by mouth every 6 (six) hours as needed for mild pain.     amoxicillin (AMOXIL) 500 MG capsule Take 1 capsule (500 mg total) by mouth 3 (three) times daily for 7 days  for dental infection. 21 capsule 0   Blood Glucose Monitoring Suppl (ACCU-CHEK GUIDE) w/Device KIT Use to check blood sugar 1 to 2 times daily 1 kit 0   Blood Glucose Monitoring Suppl (ONETOUCH VERIO FLEX SYSTEM) w/Device KIT Check blood sugars 1 to 2 times daily 1 kit 1   clopidogrel (PLAVIX) 75 MG tablet Take 1 tablet (75 mg total) by mouth daily. 90 tablet 1   clopidogrel (PLAVIX) 75 MG tablet Take 1 tablet (75 mg total) by mouth daily. 90 tablet 1   dextromethorphan-guaiFENesin (MUCINEX DM) 30-600 MG 12hr tablet Take 1 tablet by mouth 2 (two) times daily as needed for cough.     Dulaglutide (TRULICITY) 0.75 MG/0.5ML SOPN Inject 0.75 mg into the skin once a week. 2 mL 5   Dulaglutide (TRULICITY) 0.75 MG/0.5ML SOPN Inject 0.75 mg into the skin once a week. 6 mL 1   Dulaglutide (TRULICITY) 0.75 MG/0.5ML SOPN Inject 0.75 mg into the skin once a week. 6 mL 3   Dulaglutide (TRULICITY) 0.75 MG/0.5ML SOPN Inject 0.75 mg into the skin  once a week. 6 mL 3   empagliflozin (JARDIANCE) 25 MG TABS tablet Take 1 tablet (25 mg total) by mouth in the morning. 90 tablet 3   glucose blood (ONETOUCH VERIO) test strip Use to check blood sugar 1 to 2 times daily. 100 each 3   glucose blood test strip Use to check blood sugar 1 to 2 times  a day 200 each 1   glucose blood test strip Use to check blood sugars 1-2 (two) times daily. 200 each 1   Lancet Devices (ONE TOUCH DELICA LANCING DEV) MISC use to check blood sugar 1-2 times a day 1 each 1   Lancets (FREESTYLE) lancets Use as directed up to 3 times daily 300 each 3   Lancets Misc. MISC Use as directed 3 times daily 300 each 3   lisinopril-hydrochlorothiazide (ZESTORETIC) 20-12.5 MG tablet Take 1 tablet by mouth daily. 90 tablet 3   metFORMIN (GLUCOPHAGE) 1000 MG tablet Take 1 tablet (1,000 mg total) by mouth 2 (two) times daily. 180 tablet 0   metoprolol tartrate (LOPRESSOR) 25 MG tablet Take 0.5 tablets (12.5 mg total) by mouth 2 (two) times daily. 90 tablet 0   Multiple Vitamins-Calcium (ONE-A-DAY WOMENS PO) Take 1 tablet by mouth daily.     OneTouch Delica Lancets 33G MISC Use to check blood sugar as directed once to twice daily 100 each 3   PEG 3350-KCl-NaBcb-NaCl-NaSulf (PEG-3350/ELECTROLYTES) 236 g SOLR Use as directed 4000 mL 0   polyethylene glycol (MIRALAX / GLYCOLAX) 17 g packet Take 17 g by mouth daily for 10 days. 10 packet 0   rosuvastatin (CRESTOR) 10 MG tablet Take 1 tablet (10 mg total) by mouth daily. 90 tablet 1   sitaGLIPtin (JANUVIA) 100 MG tablet Take 1 tablet (100 mg total) by mouth daily. 30 tablet 5   estradiol (ESTRACE) 0.1 MG/GM vaginal cream apply a pea size amount vaginally nightly for 2 weeks, then every other day 42.5 g 6   prednisoLONE acetate (PRED FORTE) 1 % ophthalmic suspension Place 1 drop into the left eye 4 (four) times daily. 5 mL 0   traMADol (ULTRAM) 50 MG tablet Take 1 tablet (50 mg total) by mouth every 6 (six) hours as needed for up to 5 days.  20 tablet 0   triamcinolone cream (KENALOG) 0.1 % Apply 1 application  topically 2 (two) times daily as needed (for itching).     No  current facility-administered medications on file prior to visit.    Allergies:   Allergies  Allergen Reactions   Pioglitazone Other (See Comments)    Headaches, myalgias, chest pain, and dizziness   Canagliflozin Palpitations   Statins Nausea Only      OBJECTIVE:  Physical Exam  Vitals:   12/19/22 0732  BP: (!) 147/68  Pulse: 78  Weight: 172 lb 3.2 oz (78.1 kg)  Height: 5\' 3"  (1.6 m)   Body mass index is 30.5 kg/m. No results found.  General: well developed, well nourished, very pleasant middle-aged African-American female, seated, in no evident distress  Neurologic Exam Mental Status: Awake and fully alert.  Fluent speech and language.  Oriented to place and time. Recent memory is slightly impaired and remote memory intact. Attention span, concentration and fund of knowledge appropriate. Mood and affect appropriate.  Cranial Nerves: Pupils equal, briskly reactive to light. Extraocular movements full without nystagmus. Visual fields full to confrontation. Hearing intact. Facial sensation intact. Face, tongue, palate moves normally and symmetrically.  Motor: Normal bulk and tone. Normal strength in all tested extremity muscles Sensory.: intact to touch , pinprick , position and vibratory sensation.  Coordination: Rapid alternating movements normal in all extremities. Finger-to-nose and heel-to-shin performed accurately bilaterally. Gait and Station: Arises from chair without difficulty. Stance is normal. Gait demonstrates normal stride length and balance without use of assistive device. Tandem walk and heel toe with mild difficulty.  Reflexes: 1+ and symmetric. Toes downgoing.         ASSESSMENT: DENNY MCDOWALL is a 68 y.o. year old female with a left pontine infarct on 07/19/2020 likely secondary to small vessel disease source after  presenting with lightheadedness and imbalance.  Vascular risk factors include multiple prior strokes on imaging (left cerebellum, left thalamic bilateral BG including infarcts), HTN, HLD, DM, CHF and advanced age.      PLAN:  Left pontine stroke:  Recovered well without residual deficit Continue clopidogrel 75 mg daily  and Crestor 10 mg daily for secondary stroke prevention.  Advised refills managed by PCP as these will be life long medications Discussed secondary stroke prevention measures and importance of close PCP follow up for aggressive stroke risk factor management including BP goal<130/90, HLD with LDL goal<70 and DM with A1c.<7.  Stroke labs routinely monitored by PCP -unable to view via epic - per patient, labs have been satisfactory I have gone over the pathophysiology of stroke, warning signs and symptoms, risk factors and their management in some detail with instructions to go to the closest emergency room for symptoms of concern.  Mild cognitive impairment: Suspect more age-related but also hx of prior strokes may be contributing Advised to continue to monitor for now, had recent memory testing with PCP which was normal Advised to call if this should worsen Recommend routine cognitive exercises as well as ensuring routine physical activity, healthy diet, good sleep habits and importance of managing vascular risk factors  Left ICA outpouching:  Plan on repeat around 01/2023  CTA 01/2022: 1 mm outpouching left ICA, small aneurysm vs infundibulum CTA 01/2021: Stable appearance of left ICA outpouching CTA 07/2020 1mm outpouching L ICA representing small aneurysm vs infundibulum. Repeat CTA head/neck around 01/2021 for repeat imaging/surveillance monitoring  No fm hx. BP stable.  Remote tobacco use - quite early 2000s.     Doing well from stroke standpoint without further recommendations and risk factors are managed by PCP. Will plan on repeat imaging as noted above, if ongoing  monitoring if needed, will contact patient to schedule follow up visit.      CC:  PCP: Ollen Bowl, MD    I spent 30 minutes of face-to-face and non-face-to-face time with patient.  This included previsit chart review, lab review, study review, order entry, electronic health record documentation, patient education and discussion regarding above diagnoses and treatment plan and answered all other questions to patient's satisfaction  Ihor Austin, [facility]  [facility] [address] [place], [place] [address]  Phone [phone] Fax [phone] Note: This document was prepared with digital dictation and possible smart phrase technology. Any transcriptional errors that result from this process are unintentional.

## 2022-12-19 ENCOUNTER — Encounter: Payer: Self-pay | Admitting: Adult Health

## 2022-12-19 ENCOUNTER — Ambulatory Visit: Payer: PPO | Admitting: Adult Health

## 2022-12-19 VITALS — BP 147/68 | HR 78 | Ht 63.0 in | Wt 172.2 lb

## 2022-12-19 DIAGNOSIS — Z8673 Personal history of transient ischemic attack (TIA), and cerebral infarction without residual deficits: Secondary | ICD-10-CM

## 2022-12-19 DIAGNOSIS — I639 Cerebral infarction, unspecified: Secondary | ICD-10-CM

## 2022-12-19 DIAGNOSIS — G3184 Mild cognitive impairment, so stated: Secondary | ICD-10-CM

## 2022-12-19 DIAGNOSIS — I671 Cerebral aneurysm, nonruptured: Secondary | ICD-10-CM | POA: Diagnosis not present

## 2022-12-19 NOTE — Patient Instructions (Addendum)
You will be called to schedule a repeat CTA head for follow-up imaging, if this remains stable, will follow up with Dr. Pearlean Brownie to see if continued monitoring is indicated  Some mild memory loss can be normal with age as well as having prior strokes. Continue to monitor this for now, if this should worsen, please let me know. You can do things like memory exercises such as cross word puzzles, word search, sudoku, card games, etc to help exercise/stimulate your memory. Also ensuring routine physical activity, eating healthy, good sleep and managing your stroke risk factors are also very important.   Continue clopidogrel 75 mg daily  and Crestor for secondary stroke prevention  Continue to follow up with PCP regarding blood pressure, cholesterol and diabetes management  Maintain strict control of hypertension with blood pressure goal below 130/90, diabetes with hemoglobin A1c goal below 7.0 % and cholesterol with LDL cholesterol (bad cholesterol) goal below 70 mg/dL.   Signs of a Stroke? Follow the BEFAST method:  Balance Watch for a sudden loss of balance, trouble with coordination or vertigo Eyes Is there a sudden loss of vision in one or both eyes? Or double vision?  Face: Ask the person to smile. Does one side of the face droop or is it numb?  Arms: Ask the person to raise both arms. Does one arm drift downward? Is there weakness or numbness of a leg? Speech: Ask the person to repeat a simple phrase. Does the speech sound slurred/strange? Is the person confused ? Time: If you observe any of these signs, call [phone].    Follow up will be determined if needed after completion of above testing    Thank you for coming to see [place] at [facility]. I hope we have been able to provide you high quality care today.  You may receive a patient satisfaction survey over the next few weeks. We would appreciate your feedback and comments so that we may continue to improve ourselves and the  health of our patients.

## 2022-12-20 ENCOUNTER — Ambulatory Visit
Admission: RE | Admit: 2022-12-20 | Discharge: 2022-12-20 | Disposition: A | Discharge status: Home / Self Care | Payer: PPO | Source: Ambulatory Visit | Attending: Internal Medicine | Admitting: Internal Medicine

## 2022-12-20 DIAGNOSIS — Z1231 Encounter for screening mammogram for malignant neoplasm of breast: Secondary | ICD-10-CM | POA: Diagnosis not present

## 2022-12-20 DIAGNOSIS — Z0000 Encounter for general adult medical examination without abnormal findings: Secondary | ICD-10-CM

## 2022-12-21 ENCOUNTER — Telehealth: Payer: Self-pay | Admitting: Adult Health

## 2022-12-21 NOTE — Telephone Encounter (Signed)
[employer] adv NPR sent to GI [phone]

## 2023-01-05 ENCOUNTER — Other Ambulatory Visit (HOSPITAL_COMMUNITY): Payer: Self-pay

## 2023-01-05 MED ORDER — METFORMIN HCL 1000 MG PO TABS
1000.0000 mg | ORAL_TABLET | Freq: Two times a day (BID) | ORAL | 1 refills | Status: DC
  Filled 2023-01-05: qty 200, 100d supply, fill #0
  Filled 2023-09-25: qty 200, 100d supply, fill #1

## 2023-01-05 MED ORDER — METOPROLOL TARTRATE 25 MG PO TABS
12.5000 mg | ORAL_TABLET | Freq: Two times a day (BID) | ORAL | 1 refills | Status: DC
  Filled 2023-01-05: qty 90, 90d supply, fill #0
  Filled 2023-06-14: qty 90, 90d supply, fill #1

## 2023-01-05 MED ORDER — ONETOUCH DELICA PLUS LANCET33G MISC
1.0000 | Freq: Two times a day (BID) | 3 refills | Status: AC
  Filled 2023-01-05: qty 100, 50d supply, fill #0
  Filled 2023-04-11: qty 100, 50d supply, fill #1
  Filled 2023-06-15: qty 100, 50d supply, fill #2
  Filled 2023-09-07: qty 100, 50d supply, fill #3

## 2023-01-12 ENCOUNTER — Ambulatory Visit
Admission: RE | Admit: 2023-01-12 | Discharge: 2023-01-12 | Disposition: A | Discharge status: Home / Self Care | Payer: PPO | Source: Ambulatory Visit | Attending: Adult Health | Admitting: Adult Health

## 2023-01-12 DIAGNOSIS — I671 Cerebral aneurysm, nonruptured: Secondary | ICD-10-CM

## 2023-01-12 MED ORDER — IOPAMIDOL (ISOVUE-370) INJECTION 76%
500.0000 mL | Freq: Once | INTRAVENOUS | Status: AC | PRN
  Administered 2023-01-12: 75 mL via INTRAVENOUS

## 2023-01-18 ENCOUNTER — Other Ambulatory Visit (HOSPITAL_COMMUNITY): Payer: Self-pay

## 2023-02-05 ENCOUNTER — Other Ambulatory Visit (HOSPITAL_COMMUNITY): Payer: Self-pay

## 2023-02-06 ENCOUNTER — Other Ambulatory Visit (HOSPITAL_COMMUNITY): Payer: Self-pay

## 2023-02-06 MED ORDER — METOPROLOL TARTRATE 25 MG PO TABS: 12.5000 mg | ORAL_TABLET | Freq: Two times a day (BID) | ORAL | 1 refills | Status: AC

## 2023-02-07 ENCOUNTER — Other Ambulatory Visit (HOSPITAL_COMMUNITY): Payer: Self-pay

## 2023-02-24 ENCOUNTER — Other Ambulatory Visit (HOSPITAL_COMMUNITY): Payer: Self-pay

## 2023-02-27 ENCOUNTER — Other Ambulatory Visit (HOSPITAL_COMMUNITY): Payer: Self-pay

## 2023-02-27 MED ORDER — ROSUVASTATIN CALCIUM 10 MG PO TABS
10.0000 mg | ORAL_TABLET | Freq: Every day | ORAL | 1 refills | Status: DC
  Filled 2023-02-27: qty 90, 90d supply, fill #0
  Filled 2023-06-27: qty 90, 90d supply, fill #1

## 2023-02-28 ENCOUNTER — Other Ambulatory Visit (HOSPITAL_COMMUNITY): Payer: Self-pay

## 2023-03-01 ENCOUNTER — Other Ambulatory Visit (HOSPITAL_COMMUNITY): Payer: Self-pay

## 2023-03-14 ENCOUNTER — Other Ambulatory Visit (HOSPITAL_COMMUNITY): Payer: Self-pay

## 2023-03-14 ENCOUNTER — Other Ambulatory Visit: Payer: Self-pay

## 2023-03-15 ENCOUNTER — Other Ambulatory Visit (HOSPITAL_COMMUNITY): Payer: Self-pay

## 2023-04-03 DIAGNOSIS — H1045 Other chronic allergic conjunctivitis: Secondary | ICD-10-CM | POA: Diagnosis not present

## 2023-04-03 DIAGNOSIS — E119 Type 2 diabetes mellitus without complications: Secondary | ICD-10-CM | POA: Diagnosis not present

## 2023-04-03 DIAGNOSIS — Z961 Presence of intraocular lens: Secondary | ICD-10-CM | POA: Diagnosis not present

## 2023-04-03 DIAGNOSIS — I639 Cerebral infarction, unspecified: Secondary | ICD-10-CM | POA: Diagnosis not present

## 2023-05-08 ENCOUNTER — Other Ambulatory Visit (HOSPITAL_COMMUNITY): Payer: Self-pay

## 2023-05-08 MED ORDER — CLOPIDOGREL BISULFATE 75 MG PO TABS
75.0000 mg | ORAL_TABLET | Freq: Every day | ORAL | 0 refills | Status: DC
  Filled 2023-05-08: qty 90, 90d supply, fill #0

## 2023-06-02 ENCOUNTER — Other Ambulatory Visit (HOSPITAL_COMMUNITY): Payer: Self-pay

## 2023-06-02 MED ORDER — JANUVIA 100 MG PO TABS
100.0000 mg | ORAL_TABLET | Freq: Every day | ORAL | 0 refills | Status: DC
  Filled 2023-06-02: qty 90, 90d supply, fill #0

## 2023-06-14 ENCOUNTER — Other Ambulatory Visit (HOSPITAL_COMMUNITY): Payer: Self-pay

## 2023-06-14 MED ORDER — JARDIANCE 25 MG PO TABS
25.0000 mg | ORAL_TABLET | Freq: Every morning | ORAL | 3 refills | Status: AC
  Filled 2023-09-05 – 2023-09-06 (×2): qty 90, 90d supply, fill #0
  Filled 2023-12-07: qty 90, 90d supply, fill #1

## 2023-06-19 ENCOUNTER — Other Ambulatory Visit (HOSPITAL_COMMUNITY): Payer: Self-pay

## 2023-06-28 ENCOUNTER — Other Ambulatory Visit (HOSPITAL_COMMUNITY): Payer: Self-pay

## 2023-07-31 ENCOUNTER — Other Ambulatory Visit (HOSPITAL_COMMUNITY): Payer: Self-pay

## 2023-07-31 MED ORDER — LISINOPRIL-HYDROCHLOROTHIAZIDE 20-12.5 MG PO TABS
1.0000 | ORAL_TABLET | Freq: Every day | ORAL | 3 refills | Status: AC
  Filled 2023-07-31 – 2023-08-11 (×2): qty 90, 90d supply, fill #0
  Filled 2023-11-10: qty 90, 90d supply, fill #1
  Filled 2024-02-08: qty 90, 90d supply, fill #2

## 2023-07-31 MED ORDER — CLOPIDOGREL BISULFATE 75 MG PO TABS
75.0000 mg | ORAL_TABLET | Freq: Every day | ORAL | 3 refills | Status: AC
  Filled 2023-07-31 – 2023-08-11 (×2): qty 90, 90d supply, fill #0
  Filled 2023-11-10: qty 90, 90d supply, fill #1
  Filled 2024-02-08: qty 90, 90d supply, fill #2

## 2023-08-07 ENCOUNTER — Other Ambulatory Visit: Payer: Self-pay

## 2023-08-09 ENCOUNTER — Other Ambulatory Visit (HOSPITAL_COMMUNITY): Payer: Self-pay

## 2023-08-11 ENCOUNTER — Other Ambulatory Visit (HOSPITAL_COMMUNITY): Payer: Self-pay

## 2023-08-29 ENCOUNTER — Other Ambulatory Visit (HOSPITAL_COMMUNITY): Payer: Self-pay

## 2023-08-29 MED ORDER — TRULICITY 0.75 MG/0.5ML ~~LOC~~ SOAJ
0.7500 mg | SUBCUTANEOUS | 0 refills | Status: DC
  Filled 2023-08-29: qty 2, 28d supply, fill #0

## 2023-08-30 ENCOUNTER — Other Ambulatory Visit (HOSPITAL_COMMUNITY): Payer: Self-pay

## 2023-09-05 ENCOUNTER — Other Ambulatory Visit (HOSPITAL_COMMUNITY): Payer: Self-pay

## 2023-09-05 DIAGNOSIS — N8110 Cystocele, unspecified: Secondary | ICD-10-CM | POA: Diagnosis not present

## 2023-09-05 DIAGNOSIS — I671 Cerebral aneurysm, nonruptured: Secondary | ICD-10-CM | POA: Diagnosis not present

## 2023-09-05 DIAGNOSIS — M25512 Pain in left shoulder: Secondary | ICD-10-CM | POA: Diagnosis not present

## 2023-09-05 DIAGNOSIS — G3184 Mild cognitive impairment, so stated: Secondary | ICD-10-CM | POA: Diagnosis not present

## 2023-09-05 DIAGNOSIS — E2839 Other primary ovarian failure: Secondary | ICD-10-CM | POA: Diagnosis not present

## 2023-09-05 DIAGNOSIS — Z8673 Personal history of transient ischemic attack (TIA), and cerebral infarction without residual deficits: Secondary | ICD-10-CM | POA: Diagnosis not present

## 2023-09-05 DIAGNOSIS — E7800 Pure hypercholesterolemia, unspecified: Secondary | ICD-10-CM | POA: Diagnosis not present

## 2023-09-05 DIAGNOSIS — E669 Obesity, unspecified: Secondary | ICD-10-CM | POA: Diagnosis not present

## 2023-09-05 DIAGNOSIS — I10 Essential (primary) hypertension: Secondary | ICD-10-CM | POA: Diagnosis not present

## 2023-09-05 DIAGNOSIS — Z6830 Body mass index (BMI) 30.0-30.9, adult: Secondary | ICD-10-CM | POA: Diagnosis not present

## 2023-09-05 DIAGNOSIS — E1169 Type 2 diabetes mellitus with other specified complication: Secondary | ICD-10-CM | POA: Diagnosis not present

## 2023-09-05 MED ORDER — MELOXICAM 15 MG PO TABS
15.0000 mg | ORAL_TABLET | Freq: Every day | ORAL | 0 refills | Status: AC
  Filled 2023-09-05: qty 30, 30d supply, fill #0

## 2023-09-06 ENCOUNTER — Other Ambulatory Visit: Payer: Self-pay

## 2023-09-06 ENCOUNTER — Other Ambulatory Visit (HOSPITAL_COMMUNITY): Payer: Self-pay

## 2023-09-06 MED ORDER — JANUVIA 100 MG PO TABS
100.0000 mg | ORAL_TABLET | Freq: Every day | ORAL | 1 refills | Status: AC
  Filled 2023-09-06 – 2024-02-13 (×3): qty 90, 90d supply, fill #0

## 2023-09-12 ENCOUNTER — Other Ambulatory Visit (HOSPITAL_COMMUNITY): Payer: Self-pay

## 2023-09-12 MED ORDER — DICLOFENAC POTASSIUM 50 MG PO TABS
50.0000 mg | ORAL_TABLET | Freq: Two times a day (BID) | ORAL | 0 refills | Status: AC | PRN
  Filled 2023-09-12: qty 14, 7d supply, fill #0

## 2023-09-13 ENCOUNTER — Other Ambulatory Visit (HOSPITAL_COMMUNITY): Payer: Self-pay

## 2023-09-18 DIAGNOSIS — N814 Uterovaginal prolapse, unspecified: Secondary | ICD-10-CM | POA: Diagnosis not present

## 2023-09-19 NOTE — Therapy (Signed)
 OUTPATIENT PHYSICAL THERAPY SHOULDER EVALUATION   Patient Name: Brittany [name] MRN: [id number] DOB:1955-07-14, 68 y.o., female Today's Date: 09/19/2023  END OF SESSION:   Past Medical History:  Diagnosis Date   Diabetes mellitus without complication (HCC)    Hyperlipidemia    Hypertension    Stroke (HCC)    No past surgical history on file. Patient Active Problem List   Diagnosis Date Noted   HTN (hypertension) 07/19/2020   Acute CVA (cerebrovascular accident) (HCC) 07/19/2020   Chronic diastolic congestive heart failure (HCC) 11/28/2019   Pyrexia    SIRS (systemic inflammatory response syndrome) (HCC) 10/26/2014   Tachypnea 10/26/2014   Hyperlipidemia 10/26/2014   Elevated transaminase level 10/26/2014   Sepsis (HCC) 10/25/2014   Leukocytosis    Diabetes mellitus type 2, uncontrolled, with complications 10/20/2014   Hypokalemia 10/20/2014   Shortness of breath    Elevated troponin    Diabetes (HCC) 09/13/2012    PCP: Vernon Velna SAUNDERS, MD   REFERRING PROVIDER: Vernon Velna SAUNDERS, MD   REFERRING DIAG: M25.512 (ICD-10-CM) - Pain in left shoulder   THERAPY DIAG:  No diagnosis found.  Rationale for Evaluation and Treatment: Rehabilitation  ONSET DATE: 9 months  SUBJECTIVE:                                                                                                                                                                                      SUBJECTIVE STATEMENT: Pt reports L GH jt and upper shoulder pain with an insidious onset. Pt states she has pain with shoulder movements, and denies an issue with neck movements. Pt feels like the pain is getting better.  Hand dominance: Right  PERTINENT HISTORY: DM, hx stroke  PAIN:  Are you having pain? Yes: NPRS scale: 4/10 c  overhead movement. No pain with arm resting by her side. Pain location: L GH jt and upper shoulder Pain description: ache Aggravating factors: Lifting arm over head or behind her  back Relieving factors: Pain medication, resting  PRECAUTIONS: None  RED FLAGS: None   WEIGHT BEARING RESTRICTIONS: No  FALLS:  Has patient fallen in last 6 months? No  LIVING ENVIRONMENT: Lives with: lives with their family Lives in: House/apartment Able to access home  OCCUPATION: Retired  PLOF: Independent  PATIENT GOALS:Less pain  NEXT MD VISIT:   OBJECTIVE:  Note: Objective measures were completed at Evaluation unless otherwise noted.  DIAGNOSTIC FINDINGS:  None   PATIENT SURVEYS:  Quick Dash: 35%  COGNITION: Overall cognitive status: Within functional limits for tasks assessed     SENSATION: WFL  POSTURE: Forward head nad rounded shoulders, increased thoracic kyphosis  UPPER EXTREMITY ROM:  End range of L shoulder motions are painful  Active ROM Right eval Left eval  Shoulder flexion 140 A 110, P 110 Shoulder shrug   Shoulder extension    Shoulder abduction  A 90 Shoulder shrug  Shoulder adduction    Shoulder internal rotation T4 A L lat hip, P 40  Shoulder external rotation L3 A T1, P 20  Elbow flexion    Elbow extension    Wrist flexion    Wrist extension    Wrist ulnar deviation    Wrist radial deviation    Wrist pronation    Wrist supination    (Blank rows = not tested)  UPPER EXTREMITY MMT:   L 4 to 4+/5 with no overt weakness MMT Right eval Left eval  Shoulder flexion    Shoulder extension    Shoulder abduction    Shoulder adduction    Shoulder internal rotation    Shoulder external rotation    Middle trapezius    Lower trapezius    Elbow flexion    Elbow extension    Wrist flexion    Wrist extension    Wrist ulnar deviation    Wrist radial deviation    Wrist pronation    Wrist supination    Grip strength (lbs)    (Blank rows = not tested)  SHOULDER SPECIAL TESTS: Impingement tests: Hawkins/Kennedy impingement test: negative Rotator cuff assessment: Empty can test: negative and Full can test: negative Biceps  assessment: Yergason's test: negative and Speed's test: negative  PALPATION:  TTP L peri-GH area                                                                                                                             TREATMENT DATE: [facility] Adult PT Treatment:                                                DATE: 09/20/23 Therapeutic Exercise: Developed, instructed in, and pt completed therex as noted in HEP   PATIENT EDUCATION: Education details: Eval findings, POC, HEP Person educated: Patient and Spouse Education method: Explanation, Demonstration, Tactile cues, Verbal cues, and Handouts Education comprehension: verbalized understanding, returned demonstration, verbal cues required, tactile cues required, and needs further education  HOME EXERCISE PROGRAM: Access Code: [email or web address] URL: [email or web address] Date: 09/20/2023 Prepared by: Dasie Daft  Exercises - Supine Shoulder Flexion Extension AAROM with Dowel  - 2 x daily - 7 x weekly - 1 sets - 10 reps - 10 hold - Supine Shoulder External Rotation with Dowel  - 2 x daily - 7 x weekly - 1 sets - 10 reps - 10 hold  ASSESSMENT:  CLINICAL IMPRESSION: Patient is a 68 y.o. female who was seen today for physical therapy evaluation and treatment for M25.512 (ICD-10-CM) - Pain in left shoulder. Pt presents with signs  and symptoms consistent with L shoulder adhesive capsulitis. With L shoulder elevation, decreased GH jt ROM is apparent and shoulder shrugging occurs to compensate for the decreased GH jt mobility. Pt will benefit from skilled PT 2w8 to address impairments to optimize L shoulder function with less pain.   OBJECTIVE IMPAIRMENTS: decreased activity tolerance, decreased ROM, postural dysfunction, and pain.   ACTIVITY LIMITATIONS: carrying, lifting, bathing, dressing, reach over head, and hygiene/grooming  PARTICIPATION LIMITATIONS: meal prep, cleaning, and laundry  PERSONAL FACTORS: Fitness and Time since onset  of injury/illness/exacerbation are also affecting patient's functional outcome.   REHAB POTENTIAL: Good  CLINICAL DECISION MAKING: Evolving/moderate complexity  EVALUATION COMPLEXITY: Moderate   GOALS:  SHORT TERM GOALS: Target date: 10/06/23  Pt will be Ind in an initial HEP  Baseline: started Goal status: INITIAL  2.  Pt's AROM for L shoulder flexion and abd  will increase by 10d or greater for improved functional use of the L UE Baseline: see flow sheet Goal status: INITIAL  LONG TERM GOALS: Target date: 11/24/23  Pt will be Ind in a final HEP to maintain achieved LOF  Baseline:  Goal status: INITIAL  2.  Increase L shoulder AROM to flexion 130, abd 115, ER to T3, and IR to L5 for improved function with completing hair care and dressing Baseline:  see flow sheet Goal status: INITIAL  3.  Pt will report 50% or greater improvement in L shoulder pain at the end ranges of l shoulders motions for better function and QOL Baseline: 4/10 Goal status: INITIAL  4.  Pt will be able to reach [facility] c 2# for improved L UE use with household activities Baseline:  Goal status: INITIAL  5.  Pt's Quick Dash score will improve by the MCID to 20% as indication of improved function  Baseline: 35% Goal status: INITIAL  PLAN:  PT FREQUENCY: 2x/week  PT DURATION: 8 weeks  PLANNED INTERVENTIONS: 97164- PT Re-evaluation, 97110-Therapeutic exercises, 97530- Therapeutic activity, 97112- Neuromuscular re-education, 97535- Self Care, [id number]- Manual therapy, [id number]- Electrical stimulation (unattended), 20560 (1-2 muscles), 20561 (3+ muscles)- Dry Needling, Patient/Family education, Taping, Joint mobilization, Cryotherapy, and Moist heat  PLAN FOR NEXT SESSION: Assess response to HEP; progress therex as indicated; use of modalities, manual therapy; and TPDN as indicated.    Bryer Gottsch MS, PT 09/20/23 6:38 PM

## 2023-09-20 ENCOUNTER — Ambulatory Visit: Attending: Internal Medicine

## 2023-09-20 ENCOUNTER — Other Ambulatory Visit: Payer: Self-pay

## 2023-09-20 DIAGNOSIS — M25612 Stiffness of left shoulder, not elsewhere classified: Secondary | ICD-10-CM | POA: Diagnosis not present

## 2023-09-20 DIAGNOSIS — G8929 Other chronic pain: Secondary | ICD-10-CM | POA: Insufficient documentation

## 2023-09-20 DIAGNOSIS — M25512 Pain in left shoulder: Secondary | ICD-10-CM | POA: Insufficient documentation

## 2023-09-20 DIAGNOSIS — R293 Abnormal posture: Secondary | ICD-10-CM | POA: Diagnosis not present

## 2023-09-23 DIAGNOSIS — Z23 Encounter for immunization: Secondary | ICD-10-CM | POA: Diagnosis not present

## 2023-09-25 ENCOUNTER — Other Ambulatory Visit (HOSPITAL_COMMUNITY): Payer: Self-pay

## 2023-09-25 MED ORDER — METOPROLOL TARTRATE 25 MG PO TABS
12.5000 mg | ORAL_TABLET | Freq: Two times a day (BID) | ORAL | 1 refills | Status: AC
  Filled 2023-09-25 (×2): qty 90, 90d supply, fill #0
  Filled 2024-01-26: qty 90, 90d supply, fill #1

## 2023-09-25 MED ORDER — ROSUVASTATIN CALCIUM 10 MG PO TABS
10.0000 mg | ORAL_TABLET | Freq: Every day | ORAL | 1 refills | Status: AC
  Filled 2023-09-25: qty 90, 90d supply, fill #0
  Filled 2023-12-25: qty 90, 90d supply, fill #1

## 2023-09-25 MED ORDER — TRULICITY 0.75 MG/0.5ML ~~LOC~~ SOAJ
0.7500 mg | SUBCUTANEOUS | 0 refills | Status: DC
  Filled 2023-09-25: qty 2, 28d supply, fill #0

## 2023-10-03 ENCOUNTER — Ambulatory Visit: Admitting: Physical Therapy

## 2023-10-05 ENCOUNTER — Encounter: Admitting: Physical Therapy

## 2023-10-10 ENCOUNTER — Ambulatory Visit: Admitting: Physical Therapy

## 2023-10-12 ENCOUNTER — Encounter: Admitting: Physical Therapy

## 2023-10-17 ENCOUNTER — Ambulatory Visit

## 2023-10-19 ENCOUNTER — Encounter

## 2023-10-22 ENCOUNTER — Other Ambulatory Visit (HOSPITAL_COMMUNITY): Payer: Self-pay

## 2023-10-23 ENCOUNTER — Other Ambulatory Visit (HOSPITAL_COMMUNITY): Payer: Self-pay

## 2023-10-23 MED ORDER — TRULICITY 0.75 MG/0.5ML ~~LOC~~ SOAJ
0.7500 mg | SUBCUTANEOUS | 1 refills | Status: DC
  Filled 2023-10-23: qty 2, 28d supply, fill #0
  Filled 2023-11-17: qty 2, 28d supply, fill #1

## 2023-10-24 ENCOUNTER — Other Ambulatory Visit (HOSPITAL_COMMUNITY): Payer: Self-pay

## 2023-11-22 ENCOUNTER — Other Ambulatory Visit: Payer: Self-pay | Admitting: Internal Medicine

## 2023-11-22 DIAGNOSIS — Z1231 Encounter for screening mammogram for malignant neoplasm of breast: Secondary | ICD-10-CM

## 2023-12-17 ENCOUNTER — Other Ambulatory Visit (HOSPITAL_COMMUNITY): Payer: Self-pay

## 2023-12-18 ENCOUNTER — Other Ambulatory Visit (HOSPITAL_COMMUNITY): Payer: Self-pay

## 2023-12-18 MED ORDER — TRULICITY 0.75 MG/0.5ML ~~LOC~~ SOAJ
0.7500 mg | SUBCUTANEOUS | 1 refills | Status: DC
  Filled 2023-12-18: qty 2, 28d supply, fill #0
  Filled 2024-01-12: qty 2, 28d supply, fill #1

## 2023-12-22 ENCOUNTER — Ambulatory Visit
Admission: RE | Admit: 2023-12-22 | Discharge: 2023-12-22 | Disposition: A | Source: Ambulatory Visit | Attending: Internal Medicine

## 2023-12-22 DIAGNOSIS — Z1231 Encounter for screening mammogram for malignant neoplasm of breast: Secondary | ICD-10-CM

## 2023-12-25 ENCOUNTER — Ambulatory Visit: Payer: PPO | Admitting: Adult Health

## 2023-12-25 ENCOUNTER — Encounter: Payer: Self-pay | Admitting: Adult Health

## 2023-12-25 VITALS — BP 127/68 | HR 78 | Ht 63.0 in | Wt 170.8 lb

## 2023-12-25 DIAGNOSIS — I671 Cerebral aneurysm, nonruptured: Secondary | ICD-10-CM

## 2023-12-25 DIAGNOSIS — Z8673 Personal history of transient ischemic attack (TIA), and cerebral infarction without residual deficits: Secondary | ICD-10-CM | POA: Diagnosis not present

## 2023-12-25 DIAGNOSIS — I639 Cerebral infarction, unspecified: Secondary | ICD-10-CM | POA: Diagnosis not present

## 2023-12-25 DIAGNOSIS — G3184 Mild cognitive impairment, so stated: Secondary | ICD-10-CM

## 2023-12-25 NOTE — Patient Instructions (Signed)
 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 I hope we have been able to provide you high quality care today.  You may receive a patient satisfaction survey over the next few weeks. We would appreciate your feedback and comments so that we may continue to improve ourselves and the health of our patients.

## 2023-12-25 NOTE — Progress Notes (Signed)
 [facility] [address] [place]. [place] [address] [phone]       STROKE FOLLOW UP NOTE  Brittany [name] Date of Birth:  1955-09-01 Medical Record Number:  [id number]   Primary neurologist: Dr. Rosemarie Reason for Referral: stroke follow up    SUBJECTIVE:   CHIEF COMPLAINT:  Chief Complaint  Patient presents with   RM 3     Patient is here alone for a stroke follow-up - no concerns patient is doing good      HPI:   Update 12/25/2023 JM: Patient returns for yearly stroke follow-up visit.  Overall stable without new stroke/TIA symptoms. Continued mild short term memory difficulties at time but denies any worsening and feels more age related. Still maintains ADLs and IADLs independently as well as driving without any difficulty.  Reports compliance on Plavix  and Crestor  without side effects.  Routinely follows with PCP for stroke risk factor management.  Repeat CTA head/neck 01/2023 showed unchanged appearance of 1 mm outpouching of left supraclinoid ICA, representing aneurysm vs infundibulum. Dr. Rosemarie recommended repeating in 2-3 years.  No questions or concerns at this time.      History provided for reference purposes only Update 12/19/2022 JM: Patient returns for follow-up visit and need of repeat imaging for possible cerebral aneurysm monitoring. She has been stable from stroke standpoint, no new stroke/TIA symptoms.  Does report mild short-term memory loss but she believes this is more age-related, gives examples that she may forget certain appointments and relies on calendar to assist, will come to appointments and can forget questions she wanted to ask, and can walk into a room and may forget why she went in there. Was seen by PCP recently who completed a memory test which was normal (per patient, unable to view via epic). Able to maintain ADLs and IADLs independently.  Compliant on stroke prevention medications.  Blood pressure today slightly elevated  compared to normal, monitors at home and typically 120s-130s, she suspects BP slightly elevated this morning due to caffeine intake.  Routinely follows with PCP for stroke risk factor management.  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 Plan to repeat next month.  Was seen at urgent care last month for right sided neck pain, was prescribed muscle relaxants and symptoms since resolved. No further questions or concerns today.  Update 06/21/2021 JM: Patient returns for 56-month stroke follow-up unaccompanied.  Overall stable without new or reoccurring stroke/TIA symptoms.  Denies residual deficits.  Maintains ADLs and IADLs independently.  Compliant on Plavix  and Crestor , denies side effects.  Blood pressure today 130/74. Routinely monitors at home, typically 120s/70-80s.  Glucose routinely monitored at home which has been stable.  PCP follow-up next week, plans on completing lab work - prior labs satisfactory per patient (unable to view via epic).  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  No new concerns at this time.  Update 12/21/2020 JM: Returns for 28-month stroke follow-up unaccompanied  Overall stable without new stroke/TIA symptoms Denies any continued right hand numbness or weakness Continues to maintain ADLs and IADLs independently.  Continues to work without difficulty.  Compliant on Plavix  and Crestor  -denies side effects Blood pressure today 121/81 - stable at home  Glucose levels stable at home  Has f/u in Jan with PCP with plans on repeat lab work  No new concerns at this time  Initial visit 08/25/2020 JM: Ms. Brittany [name]  is being seen for hospital follow-up unaccompanied.  Returned to ED 07/21/2020 for dizziness and feeling of unsteadiness with MR brain negative for new stroke and lab work unremarkable.  She has since been doing well without residual imbalance,  dizziness or unsteadiness. Initially had issues with right hand weakness and occasional numbness/tingling more noticeable with writing but has since greatly improved with only intermittent tingling sensation.  She plans on returning back to work this week at [employer].  Denies new stroke/TIA symptoms.  Completed 3 weeks DAPT and remains on Plavix  as well as Crestor  without side effects.  Blood pressure today 126/77. Glucose levels 120s-130s.  No further concerns at this time.  Stroke admission 07/19/2020 Brittany [name] is a 68 y.o. female with history of hypertension, hyperlipidemia, diabetes, CHF admitted on 07/19/2020 for lightheadedness and imbalance. No tPA given due to outside window.  Personally reviewed hospitalization pertinent progress notes, lab work and imaging.  Evaluated by Dr. Jerri for left pontine infarct likely secondary to small vessel disease. MRI also showed old left cerebellum, left thalamic and bilateral BG lacunar infarcts.  CTA head/neck unremarkable.  EF 55 to 60%.  LDL 62.  A1c 6.8.  On aspirin  PTA -recommended DAPT for 3 weeks and Plavix  alone as well as continuation of Crestor  10 mg daily.  Other stroke risk factors include HTN, CHF, advanced age and prior strokes on imaging.  PT/OT no recommendations    PERTINENT IMAGING  Per hospitalization 07/2020 CT no acute abnormality CTA head and neck no evidence of stenosis, 1mm L ICA small aneurysm vs infundibulum  MRI left pontine infarct, old left cerebellum, left thalamic, bilateral BG lacunar infarcts 2D Echo EF 55 to 60% LDL 62 HgbA1c 6.8      ROS:   14 system review of systems performed and negative with exception of those listed in HPI  PMH:  Past Medical History:  Diagnosis Date   Diabetes mellitus without complication (HCC)    Hyperlipidemia    Hypertension    Stroke (HCC)     PSH: No past surgical history on file.  Social History:  Social History   Socioeconomic History   Marital  status: Married    Spouse name: Not on file   Number of children: Not on file   Years of education: Not on file   Highest education level: Not on file  Occupational History   Not on file  Tobacco Use   Smoking status: Former   Smokeless tobacco: Never  Substance and Sexual Activity   Alcohol use: No    Alcohol/week: 0.0 standard drinks of alcohol   Drug use: No   Sexual activity: Yes  Other Topics Concern   Not on file  Social History Narrative   Pt lives with husband Pt retired    Drnks decaf coffee    Social Drivers of Health   Tobacco Use: Medium Risk (09/20/2023)   Patient History    Smoking Tobacco Use: Former    Smokeless Tobacco Use: Never    Passive Exposure: Not on Actuary Strain: Not on file  Food Insecurity: Low Risk (09/18/2023)   Received from [facility]   Epic    Within the past 12 months, you worried that your food would run out before you got money to buy more: Never true    Within the past 12 months, the food you bought just didn't last and you didn't have money to get more. : Never true  Transportation Needs: No Transportation Needs (09/18/2023)   Received from [facility]    In the past 12 months, has lack of reliable transportation kept you from medical appointments, meetings, work or from getting things needed for daily living? : No  Physical Activity: Not on file  Stress: Not on file  Social Connections: Not on file  Intimate Partner Violence: Not on file  Depression (EYV7-0): Not on file  Alcohol Screen: Not on file  Housing: Low Risk (09/18/2023)   Received from [facility]   Epic    What is your living situation today?: I have a steady place to live    Think about the place you live. Do you have problems with any of the following? Choose all that apply:: None/None on this list  Utilities: Low Risk (09/18/2023)   Received from [facility]   Utilities    In the past 12 months has the electric, gas, oil, or  water company threatened to shut off services in your home? : No  Health Literacy: Not on file    Family History:  Family History  Problem Relation Age of Onset   Heart block Mother    Seizures Maternal Grandmother    Asthma Other    Hypertension Other    Hyperlipidemia Other    Diabetes Other    Stroke Neg Hx     Medications:   Current Outpatient Medications on File Prior to Visit  Medication Sig Dispense Refill   acetaminophen  (TYLENOL ) 325 MG tablet Take 2 tablets (650 mg total) by mouth every 4 (four) hours as needed for up to 7 days. (Patient taking differently: Take 650 mg by mouth as needed.) 60 tablet 0   Accu-Chek Softclix Lancets lancets Use to check blood sugar 1-2 (two) times daily. 200 each 1   acetaminophen  (TYLENOL ) 500 MG tablet Take 500 mg by mouth every 6 (six) hours as needed for mild pain.     amoxicillin  (AMOXIL ) 500 MG capsule Take 1 capsule (500 mg total) by mouth 3 (three) times daily for 7 days for dental infection. (Patient not taking: Reported on 12/25/2023) 21 capsule 0   Blood Glucose Monitoring Suppl (ACCU-CHEK GUIDE) w/Device KIT Use to check blood sugar 1 to 2 times daily 1 kit 0   Blood Glucose Monitoring Suppl (ONETOUCH VERIO FLEX SYSTEM) w/Device KIT Check blood sugars 1 to 2 times daily 1 kit 1   clopidogrel  (PLAVIX ) 75 MG tablet Take 1 tablet (75 mg total) by mouth daily. 90 tablet 1   clopidogrel  (PLAVIX ) 75 MG tablet Take 1 tablet (75 mg total) by mouth daily. 90 tablet 3   dextromethorphan-guaiFENesin (MUCINEX DM) 30-600 MG 12hr tablet Take 1 tablet by mouth 2 (two) times daily as needed for cough.     diclofenac  (CATAFLAM ) 50 MG tablet Take 1 tablet (50 mg total) by mouth with food or milk 2 (two) times daily as needed. 14 tablet 0   Dulaglutide  (TRULICITY ) 0.75 MG/0.5ML SOAJ Inject 0.75 mg into the skin once a week. 6 mL 3   Dulaglutide  (TRULICITY ) 0.75 MG/0.5ML SOAJ Inject 0.75 mg into the skin once a week. 2 mL 1   Dulaglutide  (TRULICITY )  0.75 MG/0.5ML SOPN Inject 0.75 mg into the skin once a week. 2 mL 5   Dulaglutide  (TRULICITY ) 0.75 MG/0.5ML SOPN Inject 0.75 mg into the skin once a week. 6 mL 1   Dulaglutide  (TRULICITY ) 0.75 MG/0.5ML SOPN Inject 0.75 mg into the skin once a week. 6 mL  3   empagliflozin  (JARDIANCE ) 25 MG TABS tablet Take 1 tablet (25 mg total) by mouth in the morning. 90 tablet 3   estradiol  (ESTRACE ) 0.1 MG/GM vaginal cream apply a pea size amount vaginally nightly for 2 weeks, then every other day 42.5 g 6   glucose blood (ONETOUCH VERIO) test strip Use to check blood sugar 1 to 2 times daily. 100 each 3   glucose blood test strip Use to check blood sugar 1 to 2 times  a day 200 each 1   glucose blood test strip Use to check blood sugars 1-2 (two) times daily. 200 each 1   Lancet Devices (ONE TOUCH DELICA LANCING DEV) MISC use to check blood sugar 1-2 times a day 1 each 1   Lancets (FREESTYLE) lancets Use as directed up to 3 times daily 300 each 3   Lancets (ONETOUCH DELICA PLUS LANCET33G) MISC Use to check blood glucose 1 to 2 times daily. 100 each 3   Lancets Misc. MISC Use as directed 3 times daily 300 each 3   lisinopril -hydrochlorothiazide  (ZESTORETIC ) 20-12.5 MG tablet Take 1 tablet by mouth daily. 90 tablet 3   meloxicam  (MOBIC ) 15 MG tablet Take 1 tablet (15 mg total) by mouth daily. 30 tablet 0   metFORMIN  (GLUCOPHAGE ) 1000 MG tablet Take 1 tablet (1,000 mg total) by mouth 2 (two) times daily. 180 tablet 0   metFORMIN  (GLUCOPHAGE ) 1000 MG tablet Take 1 tablet (1,000 mg total) by mouth 2 (two) times daily with a meal. 200 tablet 1   metoprolol  tartrate (LOPRESSOR ) 25 MG tablet Take 1/2 tablet (12.5 mg total) by mouth 2 (two) times daily with food 100 tablet 1   metoprolol  tartrate (LOPRESSOR ) 25 MG tablet Take 0.5 tablets (12.5 mg total) by mouth 2 (two) times daily with food. 90 tablet 1   Multiple Vitamins-Calcium  (ONE-A-DAY WOMENS PO) Take 1 tablet by mouth daily.     PEG 3350 -KCl-NaBcb-NaCl-NaSulf  (PEG-3350/ELECTROLYTES) 236 g SOLR Use as directed 4000 mL 0   polyethylene glycol (MIRALAX  / GLYCOLAX ) 17 g packet Take 17 g by mouth daily for 10 days. 10 packet 0   prednisoLONE  acetate (PRED FORTE ) 1 % ophthalmic suspension Place 1 drop into the left eye 4 (four) times daily. 5 mL 0   rosuvastatin  (CRESTOR ) 10 MG tablet Take 1 tablet (10 mg total) by mouth daily. 90 tablet 1   sitaGLIPtin  (JANUVIA ) 100 MG tablet Take 1 tablet (100 mg total) by mouth daily. (Patient not taking: Reported on 12/25/2023) 90 tablet 1   traMADol  (ULTRAM ) 50 MG tablet Take 1 tablet (50 mg total) by mouth every 6 (six) hours as needed for up to 5 days. 20 tablet 0   triamcinolone cream (KENALOG) 0.1 % Apply 1 application  topically 2 (two) times daily as needed (for itching).     No current facility-administered medications on file prior to visit.    Allergies:   Allergies  Allergen Reactions   Pioglitazone Other (See Comments)    Headaches, myalgias, chest pain, and dizziness   Canagliflozin  Palpitations   Statins Nausea Only      OBJECTIVE:  Physical Exam  Vitals:   12/25/23 0741  BP: 127/68  Pulse: 78  SpO2: 96%  Weight: 170 lb 12.8 oz (77.5 kg)  Height: 5' 3 (1.6 m)   Body mass index is 30.26 kg/m. No results found.  General: well developed, well nourished, very pleasant middle-aged African-American female, seated, in no evident distress  Neurologic Exam Mental Status: Awake  and fully alert.  Fluent speech and language.  Oriented to place and time. Recent memory is slightly impaired and remote memory intact. Attention span, concentration and fund of knowledge appropriate. Mood and affect appropriate.  Cranial Nerves: Pupils equal, briskly reactive to light. Extraocular movements full without nystagmus. Visual fields full to confrontation. Hearing intact. Facial sensation intact. Face, tongue, palate moves normally and symmetrically.  Motor: Normal bulk and tone. Normal strength in all  tested extremity muscles Coordination: Rapid alternating movements normal in all extremities. Finger-to-nose and heel-to-shin performed accurately bilaterally. Gait and Station: Arises from chair without difficulty. Stance is normal. Gait demonstrates normal stride length and balance without use of assistive device. Tandem walk and heel toe with mild difficulty.          ASSESSMENT: Brittany [name] is a 68 y.o. 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  Vascular risk factors include multiple prior strokes on imaging (left cerebellum, left thalamic bilateral BG including infarcts), HTN, HLD, DM, CHF and advanced age.      PLAN:  Left pontine stroke:  Recovered well without residual deficit Continue clopidogrel  75 mg daily  and Crestor  10 mg daily for secondary stroke prevention managed/prescribed by PCP Discussed secondary stroke prevention measures and importance of close PCP follow up for aggressive stroke risk factor management including BP goal<130/90, HLD with LDL goal<70 and DM with A1c.<7.  Stroke labs routinely monitored by PCP -unable to view via epic - per patient, labs have been satisfactory I have gone over the pathophysiology of stroke, warning signs and symptoms, risk factors and their management in some detail with instructions to go to the closest emergency room for symptoms of concern.  Mild cognitive impairment: Suspect more age-related but also hx of prior strokes may be contributing Denies any worsening or progression Advised to call with any worsening or concerns for further evaluation Recommend routine cognitive exercises as well as ensuring routine physical activity, healthy diet, good sleep habits and importance of managing vascular risk factors  Left ICA outpouching:  Plan on repeat around 01/2025 CTA 01/2023: Unchanged 1 mm outpouching of left  supraclinoid ICA, compatible with tiny aneurysm or infundibulum CTA 01/2022: 1 mm outpouching left ICA, small aneurysm vs infundibulum CTA 01/2021: Stable appearance of left ICA outpouching CTA 07/2020 1mm outpouching L ICA representing small aneurysm vs infundibulum. Repeat CTA head/neck around 01/2021 for repeat imaging/surveillance monitoring  No fm hx. BP stable.  Remote tobacco use - quite early 2000s.      Follow-up in 1 year or call earlier if needed   CC:  PCP: Vernon Velna SAUNDERS, MD     Harlene Bogaert, AGNP-BC  [facility] [address] [place], [place] [address]  Phone [phone] Fax [phone] Note: This document was prepared with digital dictation and possible smart phrase technology. Any transcriptional errors that result from this process are unintentional.

## 2024-01-12 ENCOUNTER — Other Ambulatory Visit (HOSPITAL_COMMUNITY): Payer: Self-pay

## 2024-01-26 ENCOUNTER — Other Ambulatory Visit (HOSPITAL_COMMUNITY): Payer: Self-pay

## 2024-01-26 MED ORDER — METFORMIN HCL 1000 MG PO TABS
1000.0000 mg | ORAL_TABLET | Freq: Two times a day (BID) | ORAL | 1 refills | Status: AC
  Filled 2024-01-26: qty 200, 100d supply, fill #0

## 2024-01-26 MED ORDER — ACCU-CHEK SOFTCLIX LANCETS MISC
Freq: Two times a day (BID) | 1 refills | Status: AC
  Filled 2024-01-26 – 2024-01-30 (×3): qty 100, 50d supply, fill #0
  Filled 2024-02-12 – 2024-02-13 (×2): qty 100, fill #0

## 2024-01-29 ENCOUNTER — Other Ambulatory Visit (HOSPITAL_COMMUNITY): Payer: Self-pay

## 2024-01-30 ENCOUNTER — Other Ambulatory Visit (HOSPITAL_COMMUNITY): Payer: Self-pay

## 2024-01-30 MED ORDER — ACCU-CHEK SOFTCLIX LANCETS MISC
1 refills | Status: AC
  Filled 2024-01-30: qty 100, 50d supply, fill #0

## 2024-01-30 MED ORDER — ACCU-CHEK GUIDE TEST VI STRP
ORAL_STRIP | 1 refills | Status: AC
  Filled 2024-01-30: qty 100, 50d supply, fill #0

## 2024-01-30 MED ORDER — ACCU-CHEK GUIDE W/DEVICE KIT
PACK | 0 refills | Status: AC
  Filled 2024-01-30: qty 1, 30d supply, fill #0

## 2024-02-07 ENCOUNTER — Other Ambulatory Visit (HOSPITAL_COMMUNITY): Payer: Self-pay

## 2024-02-08 ENCOUNTER — Other Ambulatory Visit (HOSPITAL_COMMUNITY): Payer: Self-pay

## 2024-02-08 MED ORDER — TRULICITY 0.75 MG/0.5ML ~~LOC~~ SOAJ
0.7500 mg | SUBCUTANEOUS | 1 refills | Status: AC
  Filled 2024-02-08: qty 2, 28d supply, fill #0

## 2024-02-09 ENCOUNTER — Other Ambulatory Visit (HOSPITAL_COMMUNITY): Payer: Self-pay

## 2024-02-09 ENCOUNTER — Other Ambulatory Visit: Payer: Self-pay

## 2024-02-12 ENCOUNTER — Other Ambulatory Visit (HOSPITAL_COMMUNITY): Payer: Self-pay

## 2024-02-12 MED ORDER — METFORMIN HCL 1000 MG PO TABS: 1000.0000 mg | ORAL_TABLET | Freq: Two times a day (BID) | ORAL | 0 refills | Status: AC

## 2024-02-13 ENCOUNTER — Other Ambulatory Visit (HOSPITAL_COMMUNITY): Payer: Self-pay

## 2024-12-24 ENCOUNTER — Ambulatory Visit: Admitting: Adult Health
